# Patient Record
Sex: Male | Born: 1955 | Race: White | Hispanic: No | Marital: Single | Smoking: Current every day smoker
Health system: Southern US, Community
[De-identification: ages and names within clinical notes are randomized; demographics above are authoritative.]

## PROBLEM LIST (undated history)

## (undated) DIAGNOSIS — I739 Peripheral vascular disease, unspecified: Secondary | ICD-10-CM

## (undated) DIAGNOSIS — E119 Type 2 diabetes mellitus without complications: Secondary | ICD-10-CM

## (undated) DIAGNOSIS — G4730 Sleep apnea, unspecified: Secondary | ICD-10-CM

## (undated) DIAGNOSIS — I10 Essential (primary) hypertension: Secondary | ICD-10-CM

## (undated) DIAGNOSIS — N529 Male erectile dysfunction, unspecified: Secondary | ICD-10-CM

## (undated) HISTORY — DX: Essential (primary) hypertension: I10

## (undated) HISTORY — PX: NECK SURGERY: SHX720

## (undated) HISTORY — PX: EYE SURGERY: SHX253

---

## 2014-03-16 ENCOUNTER — Ambulatory Visit: Payer: Self-pay | Admitting: Internal Medicine

## 2014-03-16 ENCOUNTER — Other Ambulatory Visit: Payer: Self-pay | Admitting: Internal Medicine

## 2014-03-16 LAB — CBC WITH DIFFERENTIAL/PLATELET
Basophil #: 0.1 10*3/uL (ref 0.0–0.1)
Basophil %: 1.4 %
Eosinophil #: 0.3 10*3/uL (ref 0.0–0.7)
Eosinophil %: 2.6 %
HCT: 47.3 % (ref 40.0–52.0)
HGB: 16 g/dL (ref 13.0–18.0)
Lymphocyte #: 2.7 10*3/uL (ref 1.0–3.6)
Lymphocyte %: 27.8 %
MCH: 32.8 pg (ref 26.0–34.0)
MCHC: 33.8 g/dL (ref 32.0–36.0)
MCV: 97 fL (ref 80–100)
Monocyte #: 1.3 x10 3/mm — ABNORMAL HIGH (ref 0.2–1.0)
Monocyte %: 12.8 %
Neutrophil #: 5.5 10*3/uL (ref 1.4–6.5)
Neutrophil %: 55.4 %
Platelet: 200 10*3/uL (ref 150–440)
RBC: 4.88 10*6/uL (ref 4.40–5.90)
RDW: 13.6 % (ref 11.5–14.5)
WBC: 9.8 10*3/uL (ref 3.8–10.6)

## 2014-03-16 LAB — URINALYSIS, COMPLETE
Bacteria: NONE SEEN
Bilirubin,UR: NEGATIVE
Blood: NEGATIVE
Glucose,UR: NEGATIVE mg/dL (ref 0–75)
Ketone: NEGATIVE
Leukocyte Esterase: NEGATIVE
Nitrite: NEGATIVE
Ph: 5 (ref 4.5–8.0)
Protein: NEGATIVE
RBC,UR: 1 /HPF (ref 0–5)
Specific Gravity: 1.025 (ref 1.003–1.030)
Squamous Epithelial: <1
WBC UR: 1 /HPF (ref 0–5)

## 2014-03-16 LAB — TSH: Thyroid Stimulating Horm: 1.04 u[IU]/mL

## 2014-03-16 LAB — HEMOGLOBIN A1C: Hemoglobin A1C: 6.3 % (ref 4.2–6.3)

## 2014-03-16 LAB — BASIC METABOLIC PANEL
Anion Gap: 6 — ABNORMAL LOW (ref 7–16)
BUN: 16 mg/dL (ref 7–18)
Calcium, Total: 8.9 mg/dL (ref 8.5–10.1)
Chloride: 106 mmol/L (ref 98–107)
Co2: 29 mmol/L (ref 21–32)
Creatinine: 0.97 mg/dL (ref 0.60–1.30)
EGFR (African American): >60
EGFR (Non-African Amer.): >60
Glucose: 79 mg/dL (ref 65–99)
Osmolality: 281 (ref 275–301)
Potassium: 4 mmol/L (ref 3.5–5.1)
Sodium: 141 mmol/L (ref 136–145)

## 2014-03-16 LAB — HEPATIC FUNCTION PANEL A (ARMC)
Albumin: 3.7 g/dL (ref 3.4–5.0)
Alkaline Phosphatase: 68 U/L
Bilirubin, Direct: <0.1 mg/dL (ref 0.00–0.20)
Bilirubin,Total: 0.3 mg/dL (ref 0.2–1.0)
SGOT(AST): 25 U/L (ref 15–37)
SGPT (ALT): 43 U/L (ref 12–78)
Total Protein: 7.6 g/dL (ref 6.4–8.2)

## 2015-06-04 ENCOUNTER — Ambulatory Visit
Admission: RE | Admit: 2015-06-04 | Payer: BLUE CROSS/BLUE SHIELD | Source: Ambulatory Visit | Admitting: Vascular Surgery

## 2015-06-04 SURGERY — LOWER EXTREMITY ANGIOGRAPHY
Anesthesia: Moderate Sedation | Laterality: Right

## 2015-06-25 MED ORDER — HYDROCODONE-ACETAMINOPHEN 5-325 MG PO TABS
ORAL_TABLET | ORAL | Status: AC
  Filled 2015-06-25: qty 1

## 2015-06-25 MED ORDER — HEPARIN SOD (PORK) LOCK FLUSH 100 UNIT/ML IV SOLN
INTRAVENOUS | Status: AC
  Filled 2015-06-25: qty 5

## 2015-06-25 MED ORDER — BUPIVACAINE HCL (PF) 0.25 % IJ SOLN
INTRAMUSCULAR | Status: AC
  Filled 2015-06-25: qty 30

## 2015-06-25 MED ORDER — MIDAZOLAM HCL 5 MG/5ML IJ SOLN
INTRAMUSCULAR | Status: AC
  Filled 2015-06-25: qty 5

## 2015-06-25 MED ORDER — FENTANYL CITRATE (PF) 100 MCG/2ML IJ SOLN
INTRAMUSCULAR | Status: DC
Start: 2015-06-25 — End: 2015-06-25
  Filled 2015-06-25: qty 4

## 2015-10-21 DIAGNOSIS — I10 Essential (primary) hypertension: Secondary | ICD-10-CM | POA: Insufficient documentation

## 2016-09-24 DIAGNOSIS — R739 Hyperglycemia, unspecified: Secondary | ICD-10-CM | POA: Insufficient documentation

## 2016-09-24 DIAGNOSIS — Z720 Tobacco use: Secondary | ICD-10-CM | POA: Insufficient documentation

## 2017-01-15 DIAGNOSIS — I10 Essential (primary) hypertension: Secondary | ICD-10-CM | POA: Diagnosis not present

## 2017-01-15 DIAGNOSIS — Z720 Tobacco use: Secondary | ICD-10-CM | POA: Diagnosis not present

## 2017-01-15 DIAGNOSIS — R972 Elevated prostate specific antigen [PSA]: Secondary | ICD-10-CM | POA: Diagnosis not present

## 2017-01-15 DIAGNOSIS — J208 Acute bronchitis due to other specified organisms: Secondary | ICD-10-CM | POA: Diagnosis not present

## 2017-01-22 DIAGNOSIS — J301 Allergic rhinitis due to pollen: Secondary | ICD-10-CM | POA: Diagnosis not present

## 2017-01-22 DIAGNOSIS — M5412 Radiculopathy, cervical region: Secondary | ICD-10-CM | POA: Diagnosis not present

## 2017-01-22 DIAGNOSIS — I10 Essential (primary) hypertension: Secondary | ICD-10-CM | POA: Diagnosis not present

## 2017-03-03 DIAGNOSIS — M5412 Radiculopathy, cervical region: Secondary | ICD-10-CM | POA: Diagnosis not present

## 2017-03-03 DIAGNOSIS — R112 Nausea with vomiting, unspecified: Secondary | ICD-10-CM | POA: Diagnosis not present

## 2017-03-03 DIAGNOSIS — I10 Essential (primary) hypertension: Secondary | ICD-10-CM | POA: Diagnosis not present

## 2017-03-08 DIAGNOSIS — I10 Essential (primary) hypertension: Secondary | ICD-10-CM | POA: Diagnosis not present

## 2017-03-09 DIAGNOSIS — R739 Hyperglycemia, unspecified: Secondary | ICD-10-CM | POA: Diagnosis not present

## 2017-03-09 DIAGNOSIS — I10 Essential (primary) hypertension: Secondary | ICD-10-CM | POA: Diagnosis not present

## 2017-05-18 DIAGNOSIS — R8290 Unspecified abnormal findings in urine: Secondary | ICD-10-CM | POA: Diagnosis not present

## 2017-05-18 DIAGNOSIS — M545 Low back pain: Secondary | ICD-10-CM | POA: Diagnosis not present

## 2018-01-26 DIAGNOSIS — R972 Elevated prostate specific antigen [PSA]: Secondary | ICD-10-CM | POA: Diagnosis not present

## 2018-01-26 DIAGNOSIS — R739 Hyperglycemia, unspecified: Secondary | ICD-10-CM | POA: Diagnosis not present

## 2018-01-26 DIAGNOSIS — I10 Essential (primary) hypertension: Secondary | ICD-10-CM | POA: Diagnosis not present

## 2018-01-28 DIAGNOSIS — I10 Essential (primary) hypertension: Secondary | ICD-10-CM | POA: Diagnosis not present

## 2018-01-28 DIAGNOSIS — Z0000 Encounter for general adult medical examination without abnormal findings: Secondary | ICD-10-CM | POA: Diagnosis not present

## 2018-01-28 DIAGNOSIS — R739 Hyperglycemia, unspecified: Secondary | ICD-10-CM | POA: Diagnosis not present

## 2018-02-16 DIAGNOSIS — G43109 Migraine with aura, not intractable, without status migrainosus: Secondary | ICD-10-CM | POA: Diagnosis not present

## 2020-05-30 ENCOUNTER — Ambulatory Visit
Admission: RE | Admit: 2020-05-30 | Discharge: 2020-05-30 | Disposition: A | Discharge status: Home / Self Care | Payer: Self-pay | Source: Ambulatory Visit | Attending: Internal Medicine | Admitting: Internal Medicine

## 2020-05-30 ENCOUNTER — Other Ambulatory Visit: Payer: Self-pay | Admitting: Internal Medicine

## 2020-05-30 ENCOUNTER — Other Ambulatory Visit: Payer: Self-pay

## 2020-05-30 DIAGNOSIS — M79604 Pain in right leg: Secondary | ICD-10-CM | POA: Insufficient documentation

## 2020-05-30 DIAGNOSIS — E1165 Type 2 diabetes mellitus with hyperglycemia: Secondary | ICD-10-CM | POA: Insufficient documentation

## 2020-05-30 DIAGNOSIS — M79605 Pain in left leg: Secondary | ICD-10-CM

## 2020-07-15 ENCOUNTER — Encounter (INDEPENDENT_AMBULATORY_CARE_PROVIDER_SITE_OTHER): Payer: Self-pay | Admitting: Vascular Surgery

## 2020-07-15 ENCOUNTER — Other Ambulatory Visit: Payer: Self-pay

## 2020-07-15 ENCOUNTER — Ambulatory Visit (INDEPENDENT_AMBULATORY_CARE_PROVIDER_SITE_OTHER): Payer: 59 | Admitting: Vascular Surgery

## 2020-07-15 VITALS — BP 148/87 | HR 64 | Resp 16 | Ht 70.0 in | Wt 215.0 lb

## 2020-07-15 DIAGNOSIS — E1165 Type 2 diabetes mellitus with hyperglycemia: Secondary | ICD-10-CM | POA: Diagnosis not present

## 2020-07-15 DIAGNOSIS — I70213 Atherosclerosis of native arteries of extremities with intermittent claudication, bilateral legs: Secondary | ICD-10-CM | POA: Diagnosis not present

## 2020-07-15 DIAGNOSIS — I10 Essential (primary) hypertension: Secondary | ICD-10-CM

## 2020-07-15 DIAGNOSIS — I70219 Atherosclerosis of native arteries of extremities with intermittent claudication, unspecified extremity: Secondary | ICD-10-CM | POA: Insufficient documentation

## 2020-07-15 MED ORDER — GABAPENTIN 300 MG PO CAPS: 300.0000 mg | ORAL_CAPSULE | Freq: Every day | ORAL | 5 refills | Status: DC

## 2020-07-15 NOTE — Progress Notes (Signed)
MRN : [id number]  Carl Norris is a 64 y.o. (05-27-1956) male who presents with chief complaint of  Chief Complaint  Patient presents with  . New Patient (Initial Visit)    ref [facility] abnormal abi  .  History of Present Illness:    The patient is seen for evaluation of painful lower extremities and diminished pulses. Patient notes the pain is always associated with activity and is very consistent day today. Typically, the pain occurs at less than one block, progress is as activity continues to the point that the patient must stop walking. Resting including standing still for several minutes allowed resumption of the activity and the ability to walk a similar distance before stopping again. Uneven terrain and inclined shorten the distance. The pain has been progressive over the past several years. The patient states the inability to walk is now having a profound negative impact on quality of life and daily activities.  The patient denies rest pain or dangling of an extremity off the side of the bed during the night for relief. No open wounds or sores at this time. No prior interventions or surgeries.  No history of  Lower back problems of the lumbar sacral spine but he has had cervical surgeries.   The patient denies changes in claudication symptoms or new rest pain symptoms.  No new ulcers or wounds of the foot.  The patient's blood pressure has been stable and relatively well controlled. The patient denies amaurosis fugax or recent TIA symptoms. There are no recent neurological changes noted. The patient denies history of DVT, PE or superficial thrombophlebitis. The patient denies recent episodes of angina or shortness of breath.   ABI's from an outside institution Rt=0.64 and Lt=0.45  Current Meds  Medication Sig  . hydrochlorothiazide (HYDRODIURIL) 25 MG tablet Take by mouth.  . metoprolol succinate (TOPROL-XL) 100 MG 24 hr tablet Take by mouth.  . naproxen sodium (ALEVE) 220  MG tablet Take 220 mg by mouth.  . vitamin B-12 (CYANOCOBALAMIN) 1000 MCG tablet Take 1,000 mcg by mouth daily.    Past Medical History:  Diagnosis Date  . Hypertension     Social History Social History   Tobacco Use  . Smoking status: Current Every Day Smoker    Packs/day: 2.00    Types: Cigarettes  . Smokeless tobacco: Never Used  Substance Use Topics  . Alcohol use: Yes    Comment: socially  . Drug use: Never    Family History Family History  Problem Relation Age of Onset  . Cancer Father   . Vascular Disease Sister   No family history of bleeding/clotting disorders, porphyria or autoimmune disease   Allergies  Allergen Reactions  . Oxycodone-Acetaminophen Itching     REVIEW OF SYSTEMS (Negative unless checked)  Constitutional: [] Weight loss  [] Fever  [] Chills Cardiac: [] Chest pain   [] Chest pressure   [] Palpitations   [] Shortness of breath when laying flat   [] Shortness of breath with exertion. Vascular:  [x] Pain in legs with walking   [] Pain in legs at rest  [] History of DVT   [] Phlebitis   [] Swelling in legs   [] Varicose veins   [] Non-healing ulcers Pulmonary:   [] Uses home oxygen   [] Productive cough   [] Hemoptysis   [] Wheeze  [] COPD   [] Asthma Neurologic:  [] Dizziness   [] Seizures   [] History of stroke   [] History of TIA  [] Aphasia   [] Vissual changes   [] Weakness or numbness in arm   [] Weakness or numbness in  leg Musculoskeletal:   [] Joint swelling   [] Joint pain   [] Low back pain Hematologic:  [] Easy bruising  [] Easy bleeding   [] Hypercoagulable state   [] Anemic Gastrointestinal:  [] Diarrhea   [] Vomiting  [] Gastroesophageal reflux/heartburn   [] Difficulty swallowing. Genitourinary:  [] Chronic kidney disease   [] Difficult urination  [] Frequent urination   [] Blood in urine Skin:  [] Rashes   [] Ulcers  Psychological:  [] History of anxiety   []  History of major depression.  Physical Examination  Vitals:   07/15/20 1435  BP: (!) 148/87  Pulse: 64  Resp: 16   Weight: 215 lb (97.5 kg)  Height: 5\' 10"  (1.778 m)   Body mass index is 30.85 kg/m. Gen: WD/WN, NAD Head: [place]/AT, No temporalis wasting.  Ear/Nose/Throat: Hearing grossly intact, nares w/o erythema or drainage, poor dentition Eyes: PER, EOMI, sclera nonicteric.  Neck: Supple, no masses.  No bruit or JVD.  Pulmonary:  Good air movement, clear to auscultation bilaterally, no use of accessory muscles.  Cardiac: RRR, normal S1, S2, no Murmurs. Vascular:  Vessel Right Left  Radial Palpable Palpable  PT Not Palpable Not Palpable  DP Not Palpable Not Palpable  Gastrointestinal: soft, non-distended. No guarding/no peritoneal signs.  Musculoskeletal: M/S 5/5 throughout.  No deformity or atrophy.  Neurologic: CN 2-12 intact. Pain and light touch intact in extremities.  Symmetrical.  Speech is fluent. Motor exam as listed above. Psychiatric: Judgment intact, Mood & affect appropriate for pt's clinical situation. Dermatologic: No rashes or ulcers noted.  No changes consistent with cellulitis.   CBC Lab Results  Component Value Date   WBC 9.8 03/16/2014   HGB 16.0 03/16/2014   HCT 47.3 03/16/2014   MCV 97 03/16/2014   PLT 200 03/16/2014    BMET    Component Value Date/Time   NA 141 03/16/2014 1656   K 4.0 03/16/2014 1656   CL 106 03/16/2014 1656   CO2 29 03/16/2014 1656   GLUCOSE 79 03/16/2014 1656   BUN 16 03/16/2014 1656   CREATININE 0.97 03/16/2014 1656   CALCIUM 8.9 03/16/2014 1656   GFRNONAA >60 03/16/2014 1656   GFRAA >60 03/16/2014 1656   CrCl cannot be calculated (Patient's most recent lab result is older than the maximum 21 days allowed.).  COAG No results found for: INR, PROTIME  Radiology No results found.    Assessment/Plan 1. Atherosclerosis of native artery of both lower extremities with intermittent claudication (HCC) Recommend:  Patient should undergo arterial duplex of the lower extremity because there has been a significant deterioration in the  patient's lower extremity symptoms.  The patient states they are having increased pain and a marked decrease in the distance that they can walk.  The risks and benefits as well as the alternatives were discussed in detail with the patient.  All questions were answered.  Patient agrees to proceed and understands this could be a prelude to angiography and intervention.  The patient will follow up with me in the office to review the studies.   - VAS AORTA/IVC/ILIACS; Future - VAS LOWER EXTREMITY ARTERIAL DUPLEX; Future  2. Essential (primary) hypertension Continue antihypertensive medications as already ordered, these medications have been reviewed and there are no changes at this time.   3. Type 2 diabetes mellitus with hyperglycemia, without long-term current use of insulin (HCC) Continue hypoglycemic medications as already ordered, these medications have been reviewed and there are no changes at this time.  Hgb A1C to be monitored as already arranged by primary service  Levora Dredge, MD  07/15/2020 3:10 PM

## 2020-07-30 ENCOUNTER — Other Ambulatory Visit (INDEPENDENT_AMBULATORY_CARE_PROVIDER_SITE_OTHER): Payer: Self-pay | Admitting: Vascular Surgery

## 2020-07-30 DIAGNOSIS — I70213 Atherosclerosis of native arteries of extremities with intermittent claudication, bilateral legs: Secondary | ICD-10-CM

## 2020-08-01 ENCOUNTER — Ambulatory Visit (INDEPENDENT_AMBULATORY_CARE_PROVIDER_SITE_OTHER): Payer: 59 | Admitting: Vascular Surgery

## 2020-08-01 ENCOUNTER — Ambulatory Visit (INDEPENDENT_AMBULATORY_CARE_PROVIDER_SITE_OTHER): Payer: 59

## 2020-08-01 ENCOUNTER — Encounter (INDEPENDENT_AMBULATORY_CARE_PROVIDER_SITE_OTHER): Payer: Self-pay | Admitting: Vascular Surgery

## 2020-08-01 ENCOUNTER — Encounter (INDEPENDENT_AMBULATORY_CARE_PROVIDER_SITE_OTHER): Payer: 59

## 2020-08-01 ENCOUNTER — Other Ambulatory Visit: Payer: Self-pay

## 2020-08-01 VITALS — BP 159/80 | HR 67 | Resp 16 | Wt 218.8 lb

## 2020-08-01 DIAGNOSIS — E1165 Type 2 diabetes mellitus with hyperglycemia: Secondary | ICD-10-CM

## 2020-08-01 DIAGNOSIS — I70213 Atherosclerosis of native arteries of extremities with intermittent claudication, bilateral legs: Secondary | ICD-10-CM

## 2020-08-01 DIAGNOSIS — I10 Essential (primary) hypertension: Secondary | ICD-10-CM

## 2020-08-04 ENCOUNTER — Encounter (INDEPENDENT_AMBULATORY_CARE_PROVIDER_SITE_OTHER): Payer: Self-pay | Admitting: Vascular Surgery

## 2020-08-04 NOTE — H&P (View-Only) (Signed)
MRN : [id number]  Carl Norris is a 64 y.o. (July 02, 1956) male who presents with chief complaint of  Chief Complaint  Patient presents with  . Follow-up    ultrasound follow up  .  History of Present Illness:   The patient returns to the office for followup and review of the noninvasive studies. He continues to c/o severe disability because of his walking secondary to severe pain in the lower extremity symptoms.  The patient notes profound shortening of their claudication distance. No new ulcers or wounds have occurred since the last visit.  There have been no significant changes to the patient's overall health care.  The patient denies amaurosis fugax or recent TIA symptoms. There are no recent neurological changes noted. The patient denies history of DVT, PE or superficial thrombophlebitis. The patient denies recent episodes of angina or shortness of breath.    Duplex US of the lower extremity arterial system shows bilateral SFA occlusions with monophasic tibial signals.  Ultrasound of the aorta and iliac arteries does not identify a hemodynamically significant stenosis.  Current Meds  Medication Sig  . gabapentin (NEURONTIN) 300 MG capsule Take 1 capsule (300 mg total) by mouth at bedtime.  . hydrochlorothiazide (HYDRODIURIL) 25 MG tablet Take by mouth.  . metoprolol succinate (TOPROL-XL) 100 MG 24 hr tablet Take by mouth.  . naproxen sodium (ALEVE) 220 MG tablet Take 220 mg by mouth.  . vitamin B-12 (CYANOCOBALAMIN) 1000 MCG tablet Take 1,000 mcg by mouth daily.    Past Medical History:  Diagnosis Date  . Hypertension     Past Surgical History:  Procedure Laterality Date  . NECK SURGERY      Social History Social History   Tobacco Use  . Smoking status: Current Every Day Smoker    Packs/day: 2.00    Types: Cigarettes  . Smokeless tobacco: Never Used  Substance Use Topics  . Alcohol use: Yes    Comment: socially  . Drug use: Never    Family  History Family History  Problem Relation Age of Onset  . Cancer Father   . Vascular Disease Sister     Allergies  Allergen Reactions  . Oxycodone-Acetaminophen Itching     REVIEW OF SYSTEMS (Negative unless checked)  Constitutional: [] Weight loss  [] Fever  [] Chills Cardiac: [] Chest pain   [] Chest pressure   [] Palpitations   [] Shortness of breath when laying flat   [] Shortness of breath with exertion. Vascular:  [x] Pain in legs with walking   [] Pain in legs at rest  [] History of DVT   [] Phlebitis   [] Swelling in legs   [] Varicose veins   [] Non-healing ulcers Pulmonary:   [] Uses home oxygen   [] Productive cough   [] Hemoptysis   [] Wheeze  [] COPD   [] Asthma Neurologic:  [] Dizziness   [] Seizures   [] History of stroke   [] History of TIA  [] Aphasia   [] Vissual changes   [] Weakness or numbness in arm   [] Weakness or numbness in leg Musculoskeletal:   [] Joint swelling   [] Joint pain   [] Low back pain Hematologic:  [] Easy bruising  [] Easy bleeding   [] Hypercoagulable state   [] Anemic Gastrointestinal:  [] Diarrhea   [] Vomiting  [] Gastroesophageal reflux/heartburn   [] Difficulty swallowing. Genitourinary:  [] Chronic kidney disease   [] Difficult urination  [] Frequent urination   [] Blood in urine Skin:  [] Rashes   [] Ulcers  Psychological:  [] History of anxiety   []  History of major depression.  Physical Examination  Vitals:   08/01/20 0843  BP: (!) 159/80  Pulse: 67  Resp: 16  Weight: 218 lb 12.8 oz (99.2 kg)   Body mass index is 31.39 kg/m. Gen: WD/WN, NAD Head: [place]/AT, No temporalis wasting.  Ear/Nose/Throat: Hearing grossly intact, nares w/o erythema or drainage Eyes: PER, EOMI, sclera nonicteric.  Neck: Supple, no large masses.   Pulmonary:  Good air movement, no audible wheezing bilaterally, no use of accessory muscles.  Cardiac: RRR, no JVD Vascular:  Vessel Right Left  Radial Palpable Palpable  PT Not Palpable Not Palpable  DP Not Palpable Not Palpable  Gastrointestinal:  Non-distended. No guarding/no peritoneal signs.  Musculoskeletal: M/S 5/5 throughout.  No deformity or atrophy.  Neurologic: CN 2-12 intact. Symmetrical.  Speech is fluent. Motor exam as listed above. Psychiatric: Judgment intact, Mood & affect appropriate for pt's clinical situation. Dermatologic: No rashes or ulcers noted.  No changes consistent with cellulitis.  CBC Lab Results  Component Value Date   WBC 9.8 03/16/2014   HGB 16.0 03/16/2014   HCT 47.3 03/16/2014   MCV 97 03/16/2014   PLT 200 03/16/2014    BMET    Component Value Date/Time   NA 141 03/16/2014 1656   K 4.0 03/16/2014 1656   CL 106 03/16/2014 1656   CO2 29 03/16/2014 1656   GLUCOSE 79 03/16/2014 1656   BUN 16 03/16/2014 1656   CREATININE 0.97 03/16/2014 1656   CALCIUM 8.9 03/16/2014 1656   GFRNONAA >60 03/16/2014 1656   GFRAA >60 03/16/2014 1656   CrCl cannot be calculated (Patient's most recent lab result is older than the maximum 21 days allowed.).  COAG No results found for: INR, PROTIME  Radiology VAS [place] LOWER EXTREMITY ARTERIAL DUPLEX  Result Date: 08/01/2020 LOWER EXTREMITY ARTERIAL DUPLEX STUDY Indications: Claudication.  Current ABI: not done Performing Technologist: Salvadore Farber RVT  Examination Guidelines: A complete evaluation includes B-mode imaging, spectral Doppler, color Doppler, and power Doppler as needed of all accessible portions of each vessel. Bilateral testing is considered an integral part of a complete examination. Limited examinations for reoccurring indications may be performed as noted.  +-----------+--------+-----+--------+----------+-----------+ RIGHT      PSV cm/sRatioStenosisWaveform  Comments    +-----------+--------+-----+--------+----------+-----------+ CFA Mid    67                   biphasic              +-----------+--------+-----+--------+----------+-----------+ DFA        87                   biphasic               +-----------+--------+-----+--------+----------+-----------+ SFA Prox   50                   monophasic            +-----------+--------+-----+--------+----------+-----------+ SFA Mid    98                   monophasiccollat seen +-----------+--------+-----+--------+----------+-----------+ SFA Distal              occluded                      +-----------+--------+-----+--------+----------+-----------+ POP Distal 21                   monophasic            +-----------+--------+-----+--------+----------+-----------+ ATA Distal 16  monophasic            +-----------+--------+-----+--------+----------+-----------+ PTA Distal 32                   monophasic            +-----------+--------+-----+--------+----------+-----------+ PERO Distal15                   monophasic            +-----------+--------+-----+--------+----------+-----------+  +-----------+--------+-----+--------+----------+-----------+ LEFT       PSV cm/sRatioStenosisWaveform  Comments    +-----------+--------+-----+--------+----------+-----------+ CFA Mid    133                  monophasic            +-----------+--------+-----+--------+----------+-----------+ DFA        30                   monophasic            +-----------+--------+-----+--------+----------+-----------+ SFA Prox                occluded                      +-----------+--------+-----+--------+----------+-----------+ SFA Mid                 occluded                      +-----------+--------+-----+--------+----------+-----------+ SFA Distal 17                   monophasiccollat seen +-----------+--------+-----+--------+----------+-----------+ POP Distal 24                   monophasic            +-----------+--------+-----+--------+----------+-----------+ ATA Distal 14                   monophasic             +-----------+--------+-----+--------+----------+-----------+ PTA Distal 33                   monophasic            +-----------+--------+-----+--------+----------+-----------+ PERO Distal15                   monophasic            +-----------+--------+-----+--------+----------+-----------+  Summary: Right: Short segment occlusion seen in the distal SFA with collat feeding to popliteal artery. Monophasic flow distal to occlusion. Left: Proximal and mid SFA occluded with collat flow seen feeding into distal SFA and monophasic flow distally.  See table(s) above for measurements and observations. Electronically signed by Levora Dredge MD on 08/01/2020 at 12:09:49 PM.    Final    VAS US AORTA/IVC/ILIACS  Result Date: 08/01/2020 ABDOMINAL AORTA STUDY Indications: claudication left>right  Performing Technologist: Salvadore Farber RVT  Examination Guidelines: A complete evaluation includes B-mode imaging, spectral Doppler, color Doppler, and power Doppler as needed of all accessible portions of each vessel. Bilateral testing is considered an integral part of a complete examination. Limited examinations for reoccurring indications may be performed as noted.  Abdominal Aorta Findings: +-------------+-------+----------+----------+----------+--------+--------+ Location     AP (cm)Trans (cm)PSV (cm/s)Waveform  ThrombusComments +-------------+-------+----------+----------+----------+--------+--------+ Proximal     2.04   1.99      74        biphasic                   +-------------+-------+----------+----------+----------+--------+--------+  Mid          1.81   1.68      70        biphasic                   +-------------+-------+----------+----------+----------+--------+--------+ Distal       1.40   1.44      88        biphasic                   +-------------+-------+----------+----------+----------+--------+--------+ RT CIA Prox  1.0    1.1       68        biphasic                    +-------------+-------+----------+----------+----------+--------+--------+ RT CIA Mid                    129       biphasic                   +-------------+-------+----------+----------+----------+--------+--------+ RT CIA Distal                 115       biphasic                   +-------------+-------+----------+----------+----------+--------+--------+ RT EIA Prox                   82        biphasic                   +-------------+-------+----------+----------+----------+--------+--------+ RT EIA Mid                    101       triphasic                  +-------------+-------+----------+----------+----------+--------+--------+ RT EIA Distal                 96        biphasic                   +-------------+-------+----------+----------+----------+--------+--------+ LT CIA Prox  1.0    1.2       97        biphasic                   +-------------+-------+----------+----------+----------+--------+--------+ LT CIA Mid                    86        biphasic                   +-------------+-------+----------+----------+----------+--------+--------+ LT CIA Distal                 104       biphasic                   +-------------+-------+----------+----------+----------+--------+--------+ LT EIA Prox                   75        biphasic                   +-------------+-------+----------+----------+----------+--------+--------+ LT EIA Mid                    63        monophasic                 +-------------+-------+----------+----------+----------+--------+--------+  LT EIA Distal                 68        monophasic                 +-------------+-------+----------+----------+----------+--------+--------+  Summary: Abdominal Aorta: No evidence of an abdominal aortic aneurysm was visualized. The largest aortic measurement is 2.0 cm. Abdominal aorta and bilateral iliac vessels show mild atherosclerosis and no  significant stenosis. The left mid and sdistal EIA shows monophasic flow consistent with a more distal stenosis or occlusion.  *See table(s) above for measurements and observations.  Electronically signed by Levora DredgeGregory Melessia Kaus MD on 08/01/2020 at 12:09:55 PM.    Final      Assessment/Plan 1. Atherosclerosis of native artery of both lower extremities with intermittent claudication (HCC) Recommend:  The patient has experienced increased symptoms and is now describing bilateral lifestyle limiting claudication and mild rest pain.  He notes his left leg is worse than his right.   Given the severity of the patient's lower extremity symptoms the patient should undergo left leg angiography and intervention.  Risk and benefits were reviewed the patient.  Indications for the procedure were reviewed.  All questions were answered, the patient agrees to proceed with left leg angiogram and possible intervention.   The patient should continue walking and begin a more formal exercise program.  The patient should continue antiplatelet therapy and aggressive treatment of the lipid abnormalities  The patient will follow up with me after the left leg angiogram.   2. Essential (primary) hypertension Continue antihypertensive medications as already ordered, these medications have been reviewed and there are no changes at this time.   3. Type 2 diabetes mellitus with hyperglycemia, without long-term current use of insulin (HCC) Continue hypoglycemic medications as already ordered, these medications have been reviewed and there are no changes at this time.  Hgb A1C to be monitored as already arranged by primary service     Levora DredgeGregory Kamrin Spath, MD  08/04/2020 3:45 PM

## 2020-08-04 NOTE — Progress Notes (Signed)
MRN : [id number]  Carl Norris is a 64 y.o. (July 02, 1956) male who presents with chief complaint of  Chief Complaint  Patient presents with  . Follow-up    ultrasound follow up  .  History of Present Illness:   The patient returns to the office for followup and review of the noninvasive studies. He continues to c/o severe disability because of his walking secondary to severe pain in the lower extremity symptoms.  The patient notes profound shortening of their claudication distance. No new ulcers or wounds have occurred since the last visit.  There have been no significant changes to the patient's overall health care.  The patient denies amaurosis fugax or recent TIA symptoms. There are no recent neurological changes noted. The patient denies history of DVT, PE or superficial thrombophlebitis. The patient denies recent episodes of angina or shortness of breath.    Duplex US of the lower extremity arterial system shows bilateral SFA occlusions with monophasic tibial signals.  Ultrasound of the aorta and iliac arteries does not identify a hemodynamically significant stenosis.  Current Meds  Medication Sig  . gabapentin (NEURONTIN) 300 MG capsule Take 1 capsule (300 mg total) by mouth at bedtime.  . hydrochlorothiazide (HYDRODIURIL) 25 MG tablet Take by mouth.  . metoprolol succinate (TOPROL-XL) 100 MG 24 hr tablet Take by mouth.  . naproxen sodium (ALEVE) 220 MG tablet Take 220 mg by mouth.  . vitamin B-12 (CYANOCOBALAMIN) 1000 MCG tablet Take 1,000 mcg by mouth daily.    Past Medical History:  Diagnosis Date  . Hypertension     Past Surgical History:  Procedure Laterality Date  . NECK SURGERY      Social History Social History   Tobacco Use  . Smoking status: Current Every Day Smoker    Packs/day: 2.00    Types: Cigarettes  . Smokeless tobacco: Never Used  Substance Use Topics  . Alcohol use: Yes    Comment: socially  . Drug use: Never    Family  History Family History  Problem Relation Age of Onset  . Cancer Father   . Vascular Disease Sister     Allergies  Allergen Reactions  . Oxycodone-Acetaminophen Itching     REVIEW OF SYSTEMS (Negative unless checked)  Constitutional: [] Weight loss  [] Fever  [] Chills Cardiac: [] Chest pain   [] Chest pressure   [] Palpitations   [] Shortness of breath when laying flat   [] Shortness of breath with exertion. Vascular:  [x] Pain in legs with walking   [] Pain in legs at rest  [] History of DVT   [] Phlebitis   [] Swelling in legs   [] Varicose veins   [] Non-healing ulcers Pulmonary:   [] Uses home oxygen   [] Productive cough   [] Hemoptysis   [] Wheeze  [] COPD   [] Asthma Neurologic:  [] Dizziness   [] Seizures   [] History of stroke   [] History of TIA  [] Aphasia   [] Vissual changes   [] Weakness or numbness in arm   [] Weakness or numbness in leg Musculoskeletal:   [] Joint swelling   [] Joint pain   [] Low back pain Hematologic:  [] Easy bruising  [] Easy bleeding   [] Hypercoagulable state   [] Anemic Gastrointestinal:  [] Diarrhea   [] Vomiting  [] Gastroesophageal reflux/heartburn   [] Difficulty swallowing. Genitourinary:  [] Chronic kidney disease   [] Difficult urination  [] Frequent urination   [] Blood in urine Skin:  [] Rashes   [] Ulcers  Psychological:  [] History of anxiety   []  History of major depression.  Physical Examination  Vitals:   08/01/20 0843  BP: (!) 159/80  Pulse: 67  Resp: 16  Weight: 218 lb 12.8 oz (99.2 kg)   Body mass index is 31.39 kg/m. Gen: WD/WN, NAD Head: [place]/AT, No temporalis wasting.  Ear/Nose/Throat: Hearing grossly intact, nares w/o erythema or drainage Eyes: PER, EOMI, sclera nonicteric.  Neck: Supple, no large masses.   Pulmonary:  Good air movement, no audible wheezing bilaterally, no use of accessory muscles.  Cardiac: RRR, no JVD Vascular:  Vessel Right Left  Radial Palpable Palpable  PT Not Palpable Not Palpable  DP Not Palpable Not Palpable  Gastrointestinal:  Non-distended. No guarding/no peritoneal signs.  Musculoskeletal: M/S 5/5 throughout.  No deformity or atrophy.  Neurologic: CN 2-12 intact. Symmetrical.  Speech is fluent. Motor exam as listed above. Psychiatric: Judgment intact, Mood & affect appropriate for pt's clinical situation. Dermatologic: No rashes or ulcers noted.  No changes consistent with cellulitis.  CBC Lab Results  Component Value Date   WBC 9.8 03/16/2014   HGB 16.0 03/16/2014   HCT 47.3 03/16/2014   MCV 97 03/16/2014   PLT 200 03/16/2014    BMET    Component Value Date/Time   NA 141 03/16/2014 1656   K 4.0 03/16/2014 1656   CL 106 03/16/2014 1656   CO2 29 03/16/2014 1656   GLUCOSE 79 03/16/2014 1656   BUN 16 03/16/2014 1656   CREATININE 0.97 03/16/2014 1656   CALCIUM 8.9 03/16/2014 1656   GFRNONAA >60 03/16/2014 1656   GFRAA >60 03/16/2014 1656   CrCl cannot be calculated (Patient's most recent lab result is older than the maximum 21 days allowed.).  COAG No results found for: INR, PROTIME  Radiology VAS [place] LOWER EXTREMITY ARTERIAL DUPLEX  Result Date: 08/01/2020 LOWER EXTREMITY ARTERIAL DUPLEX STUDY Indications: Claudication.  Current ABI: not done Performing Technologist: Salvadore Farber RVT  Examination Guidelines: A complete evaluation includes B-mode imaging, spectral Doppler, color Doppler, and power Doppler as needed of all accessible portions of each vessel. Bilateral testing is considered an integral part of a complete examination. Limited examinations for reoccurring indications may be performed as noted.  +-----------+--------+-----+--------+----------+-----------+ RIGHT      PSV cm/sRatioStenosisWaveform  Comments    +-----------+--------+-----+--------+----------+-----------+ CFA Mid    67                   biphasic              +-----------+--------+-----+--------+----------+-----------+ DFA        87                   biphasic               +-----------+--------+-----+--------+----------+-----------+ SFA Prox   50                   monophasic            +-----------+--------+-----+--------+----------+-----------+ SFA Mid    98                   monophasiccollat seen +-----------+--------+-----+--------+----------+-----------+ SFA Distal              occluded                      +-----------+--------+-----+--------+----------+-----------+ POP Distal 21                   monophasic            +-----------+--------+-----+--------+----------+-----------+ ATA Distal 16  monophasic            +-----------+--------+-----+--------+----------+-----------+ PTA Distal 32                   monophasic            +-----------+--------+-----+--------+----------+-----------+ PERO Distal15                   monophasic            +-----------+--------+-----+--------+----------+-----------+  +-----------+--------+-----+--------+----------+-----------+ LEFT       PSV cm/sRatioStenosisWaveform  Comments    +-----------+--------+-----+--------+----------+-----------+ CFA Mid    133                  monophasic            +-----------+--------+-----+--------+----------+-----------+ DFA        30                   monophasic            +-----------+--------+-----+--------+----------+-----------+ SFA Prox                occluded                      +-----------+--------+-----+--------+----------+-----------+ SFA Mid                 occluded                      +-----------+--------+-----+--------+----------+-----------+ SFA Distal 17                   monophasiccollat seen +-----------+--------+-----+--------+----------+-----------+ POP Distal 24                   monophasic            +-----------+--------+-----+--------+----------+-----------+ ATA Distal 14                   monophasic             +-----------+--------+-----+--------+----------+-----------+ PTA Distal 33                   monophasic            +-----------+--------+-----+--------+----------+-----------+ PERO Distal15                   monophasic            +-----------+--------+-----+--------+----------+-----------+  Summary: Right: Short segment occlusion seen in the distal SFA with collat feeding to popliteal artery. Monophasic flow distal to occlusion. Left: Proximal and mid SFA occluded with collat flow seen feeding into distal SFA and monophasic flow distally.  See table(s) above for measurements and observations. Electronically signed by Levora Dredge MD on 08/01/2020 at 12:09:49 PM.    Final    VAS US AORTA/IVC/ILIACS  Result Date: 08/01/2020 ABDOMINAL AORTA STUDY Indications: claudication left>right  Performing Technologist: Salvadore Farber RVT  Examination Guidelines: A complete evaluation includes B-mode imaging, spectral Doppler, color Doppler, and power Doppler as needed of all accessible portions of each vessel. Bilateral testing is considered an integral part of a complete examination. Limited examinations for reoccurring indications may be performed as noted.  Abdominal Aorta Findings: +-------------+-------+----------+----------+----------+--------+--------+ Location     AP (cm)Trans (cm)PSV (cm/s)Waveform  ThrombusComments +-------------+-------+----------+----------+----------+--------+--------+ Proximal     2.04   1.99      74        biphasic                   +-------------+-------+----------+----------+----------+--------+--------+  Mid          1.81   1.68      70        biphasic                   +-------------+-------+----------+----------+----------+--------+--------+ Distal       1.40   1.44      88        biphasic                   +-------------+-------+----------+----------+----------+--------+--------+ RT CIA Prox  1.0    1.1       68        biphasic                    +-------------+-------+----------+----------+----------+--------+--------+ RT CIA Mid                    129       biphasic                   +-------------+-------+----------+----------+----------+--------+--------+ RT CIA Distal                 115       biphasic                   +-------------+-------+----------+----------+----------+--------+--------+ RT EIA Prox                   82        biphasic                   +-------------+-------+----------+----------+----------+--------+--------+ RT EIA Mid                    101       triphasic                  +-------------+-------+----------+----------+----------+--------+--------+ RT EIA Distal                 96        biphasic                   +-------------+-------+----------+----------+----------+--------+--------+ LT CIA Prox  1.0    1.2       97        biphasic                   +-------------+-------+----------+----------+----------+--------+--------+ LT CIA Mid                    86        biphasic                   +-------------+-------+----------+----------+----------+--------+--------+ LT CIA Distal                 104       biphasic                   +-------------+-------+----------+----------+----------+--------+--------+ LT EIA Prox                   75        biphasic                   +-------------+-------+----------+----------+----------+--------+--------+ LT EIA Mid                    63        monophasic                 +-------------+-------+----------+----------+----------+--------+--------+   LT EIA Distal                 68        monophasic                 +-------------+-------+----------+----------+----------+--------+--------+  Summary: Abdominal Aorta: No evidence of an abdominal aortic aneurysm was visualized. The largest aortic measurement is 2.0 cm. Abdominal aorta and bilateral iliac vessels show mild atherosclerosis and no  significant stenosis. The left mid and sdistal EIA shows monophasic flow consistent with a more distal stenosis or occlusion.  *See table(s) above for measurements and observations.  Electronically signed by Levora DredgeGregory Paris Hohn MD on 08/01/2020 at 12:09:55 PM.    Final      Assessment/Plan 1. Atherosclerosis of native artery of both lower extremities with intermittent claudication (HCC) Recommend:  The patient has experienced increased symptoms and is now describing bilateral lifestyle limiting claudication and mild rest pain.  He notes his left leg is worse than his right.   Given the severity of the patient's lower extremity symptoms the patient should undergo left leg angiography and intervention.  Risk and benefits were reviewed the patient.  Indications for the procedure were reviewed.  All questions were answered, the patient agrees to proceed with left leg angiogram and possible intervention.   The patient should continue walking and begin a more formal exercise program.  The patient should continue antiplatelet therapy and aggressive treatment of the lipid abnormalities  The patient will follow up with me after the left leg angiogram.   2. Essential (primary) hypertension Continue antihypertensive medications as already ordered, these medications have been reviewed and there are no changes at this time.   3. Type 2 diabetes mellitus with hyperglycemia, without long-term current use of insulin (HCC) Continue hypoglycemic medications as already ordered, these medications have been reviewed and there are no changes at this time.  Hgb A1C to be monitored as already arranged by primary service     Levora DredgeGregory Caila Cirelli, MD  08/04/2020 3:45 PM

## 2020-08-05 ENCOUNTER — Telehealth (INDEPENDENT_AMBULATORY_CARE_PROVIDER_SITE_OTHER): Payer: Self-pay

## 2020-08-05 NOTE — Telephone Encounter (Signed)
Spoke with the patient and he is scheduled with Dr. Gilda Crease for a left leg angio poss intervention on 08/27/20 with a 9:00 am arrival time to the [facility] Covid testing on 08/23/20 between 8-1 pm at the [facility]. Pre-procedure instructions were discussed and will be mailed.

## 2020-08-16 NOTE — Telephone Encounter (Signed)
Spoke with the patient and he was given the new arrival time of 7:30 am  for his left leg angio with Dr. Gilda Crease on 08/27/20. New pre-procedure instructions will be mailed with time change.

## 2020-08-23 ENCOUNTER — Other Ambulatory Visit
Admission: RE | Admit: 2020-08-23 | Discharge: 2020-08-23 | Disposition: A | Discharge status: Home / Self Care | Payer: 59 | Source: Ambulatory Visit | Attending: Vascular Surgery | Admitting: Vascular Surgery

## 2020-08-23 ENCOUNTER — Other Ambulatory Visit: Payer: Self-pay

## 2020-08-23 DIAGNOSIS — Z20822 Contact with and (suspected) exposure to covid-19: Secondary | ICD-10-CM | POA: Insufficient documentation

## 2020-08-23 DIAGNOSIS — Z01818 Encounter for other preprocedural examination: Secondary | ICD-10-CM | POA: Diagnosis present

## 2020-08-24 LAB — SARS CORONAVIRUS 2 (TAT 6-24 HRS): SARS Coronavirus 2: NEGATIVE

## 2020-08-27 ENCOUNTER — Other Ambulatory Visit (INDEPENDENT_AMBULATORY_CARE_PROVIDER_SITE_OTHER): Payer: Self-pay | Admitting: Nurse Practitioner

## 2020-08-27 ENCOUNTER — Encounter: Payer: Self-pay | Admitting: Vascular Surgery

## 2020-08-27 ENCOUNTER — Ambulatory Visit
Admission: RE | Admit: 2020-08-27 | Discharge: 2020-08-27 | Disposition: A | Discharge status: Home / Self Care | Payer: 59 | Attending: Vascular Surgery | Admitting: Vascular Surgery

## 2020-08-27 ENCOUNTER — Other Ambulatory Visit: Payer: Self-pay

## 2020-08-27 ENCOUNTER — Encounter
Admission: RE | Disposition: A | Discharge status: Home / Self Care | Payer: Self-pay | Source: Home / Self Care | Attending: Vascular Surgery

## 2020-08-27 DIAGNOSIS — E1165 Type 2 diabetes mellitus with hyperglycemia: Secondary | ICD-10-CM | POA: Insufficient documentation

## 2020-08-27 DIAGNOSIS — I70219 Atherosclerosis of native arteries of extremities with intermittent claudication, unspecified extremity: Secondary | ICD-10-CM

## 2020-08-27 DIAGNOSIS — Z885 Allergy status to narcotic agent status: Secondary | ICD-10-CM | POA: Insufficient documentation

## 2020-08-27 DIAGNOSIS — E1151 Type 2 diabetes mellitus with diabetic peripheral angiopathy without gangrene: Secondary | ICD-10-CM | POA: Insufficient documentation

## 2020-08-27 DIAGNOSIS — Z79899 Other long term (current) drug therapy: Secondary | ICD-10-CM | POA: Diagnosis not present

## 2020-08-27 DIAGNOSIS — Z8249 Family history of ischemic heart disease and other diseases of the circulatory system: Secondary | ICD-10-CM | POA: Diagnosis not present

## 2020-08-27 DIAGNOSIS — I70223 Atherosclerosis of native arteries of extremities with rest pain, bilateral legs: Secondary | ICD-10-CM | POA: Diagnosis present

## 2020-08-27 DIAGNOSIS — I10 Essential (primary) hypertension: Secondary | ICD-10-CM | POA: Diagnosis not present

## 2020-08-27 DIAGNOSIS — I70222 Atherosclerosis of native arteries of extremities with rest pain, left leg: Secondary | ICD-10-CM

## 2020-08-27 DIAGNOSIS — F17210 Nicotine dependence, cigarettes, uncomplicated: Secondary | ICD-10-CM | POA: Diagnosis not present

## 2020-08-27 HISTORY — PX: LOWER EXTREMITY ANGIOGRAPHY: CATH118251

## 2020-08-27 HISTORY — DX: Peripheral vascular disease, unspecified: I73.9

## 2020-08-27 LAB — BUN: BUN: 16 mg/dL (ref 8–23)

## 2020-08-27 LAB — CREATININE, SERUM
Creatinine, Ser: 1.18 mg/dL (ref 0.61–1.24)
GFR, Estimated: >60 mL/min (ref >60)

## 2020-08-27 SURGERY — LOWER EXTREMITY ANGIOGRAPHY
Anesthesia: Moderate Sedation | Site: Leg Lower | Laterality: Left

## 2020-08-27 MED ORDER — MIDAZOLAM HCL 5 MG/5ML IJ SOLN
INTRAMUSCULAR | Status: AC
  Filled 2020-08-27: qty 5

## 2020-08-27 MED ORDER — FAMOTIDINE 20 MG PO TABS: 40.0000 mg | ORAL_TABLET | Freq: Once | ORAL | Status: DC | PRN

## 2020-08-27 MED ORDER — MIDAZOLAM HCL 2 MG/ML PO SYRP: 8.0000 mg | ORAL_SOLUTION | Freq: Once | ORAL | Status: DC | PRN

## 2020-08-27 MED ORDER — DIPHENHYDRAMINE HCL 50 MG/ML IJ SOLN
INTRAMUSCULAR | Status: DC | PRN
  Administered 2020-08-27: 25 mg via INTRAVENOUS

## 2020-08-27 MED ORDER — METHYLPREDNISOLONE SODIUM SUCC 125 MG IJ SOLR: 125.0000 mg | Freq: Once | INTRAMUSCULAR | Status: DC | PRN

## 2020-08-27 MED ORDER — ATORVASTATIN CALCIUM 10 MG PO TABS: 10.0000 mg | ORAL_TABLET | Freq: Every day | ORAL | 11 refills | Status: DC

## 2020-08-27 MED ORDER — FENTANYL CITRATE (PF) 100 MCG/2ML IJ SOLN: 12.5000 ug | Freq: Once | INTRAMUSCULAR | Status: DC | PRN

## 2020-08-27 MED ORDER — CEFAZOLIN SODIUM-DEXTROSE 2-4 GM/100ML-% IV SOLN: 2.0000 g | Freq: Once | INTRAVENOUS | Status: AC

## 2020-08-27 MED ORDER — CLOPIDOGREL BISULFATE 75 MG PO TABS: 75.0000 mg | ORAL_TABLET | Freq: Every day | ORAL | 4 refills | Status: DC

## 2020-08-27 MED ORDER — ASPIRIN EC 325 MG PO TBEC: 325.0000 mg | DELAYED_RELEASE_TABLET | ORAL | Status: DC

## 2020-08-27 MED ORDER — HEPARIN SODIUM (PORCINE) 1000 UNIT/ML IJ SOLN
INTRAMUSCULAR | Status: DC | PRN
  Administered 2020-08-27: 3000 [IU] via INTRAVENOUS
  Administered 2020-08-27: 5000 [IU] via INTRAVENOUS

## 2020-08-27 MED ORDER — IODIXANOL 320 MG/ML IV SOLN
INTRAVENOUS | Status: DC | PRN
  Administered 2020-08-27: 110 mL via INTRA_ARTERIAL

## 2020-08-27 MED ORDER — ASPIRIN EC 81 MG PO TBEC: 81.0000 mg | DELAYED_RELEASE_TABLET | Freq: Every day | ORAL | 2 refills | Status: AC

## 2020-08-27 MED ORDER — MIDAZOLAM HCL 2 MG/2ML IJ SOLN
INTRAMUSCULAR | Status: DC | PRN
  Administered 2020-08-27: 2 mg via INTRAVENOUS
  Administered 2020-08-27: 1 mg via INTRAVENOUS
  Administered 2020-08-27: 2 mg via INTRAVENOUS

## 2020-08-27 MED ORDER — FENTANYL CITRATE (PF) 100 MCG/2ML IJ SOLN
INTRAMUSCULAR | Status: AC
  Filled 2020-08-27: qty 2

## 2020-08-27 MED ORDER — SODIUM CHLORIDE 0.9 % IV SOLN
INTRAVENOUS | Status: DC
  Administered 2020-08-27: 300 mL via INTRAVENOUS

## 2020-08-27 MED ORDER — HEPARIN SODIUM (PORCINE) 1000 UNIT/ML IJ SOLN
INTRAMUSCULAR | Status: AC
  Filled 2020-08-27: qty 1

## 2020-08-27 MED ORDER — FENTANYL CITRATE (PF) 100 MCG/2ML IJ SOLN
INTRAMUSCULAR | Status: DC | PRN
Start: 2020-08-27 — End: 2020-08-27
  Administered 2020-08-27 (×3): 50 ug via INTRAVENOUS
  Administered 2020-08-27 (×2): 25 ug via INTRAVENOUS

## 2020-08-27 MED ORDER — ONDANSETRON HCL 4 MG/2ML IJ SOLN: 4.0000 mg | Freq: Four times a day (QID) | INTRAMUSCULAR | Status: DC | PRN

## 2020-08-27 MED ORDER — CLOPIDOGREL BISULFATE 300 MG PO TABS: 300.0000 mg | ORAL_TABLET | ORAL | Status: DC

## 2020-08-27 MED ORDER — MORPHINE SULFATE (PF) 4 MG/ML IV SOLN: 2.0000 mg | INTRAVENOUS | Status: DC | PRN

## 2020-08-27 MED ORDER — DIPHENHYDRAMINE HCL 50 MG/ML IJ SOLN: 50.0000 mg | Freq: Once | INTRAMUSCULAR | Status: DC | PRN

## 2020-08-27 MED ORDER — CEFAZOLIN SODIUM-DEXTROSE 2-4 GM/100ML-% IV SOLN
INTRAVENOUS | Status: AC
  Administered 2020-08-27: 2 g via INTRAVENOUS
  Filled 2020-08-27: qty 100

## 2020-08-27 MED ORDER — DIPHENHYDRAMINE HCL 50 MG/ML IJ SOLN
INTRAMUSCULAR | Status: AC
  Filled 2020-08-27: qty 1

## 2020-08-27 SURGICAL SUPPLY — 40 items
BALLN DORADO 6X100X135 (BALLOONS) ×2
BALLN DORADO 7X40X80 (BALLOONS) ×2
BALLN LUTONIX 5X220X130 (BALLOONS) ×4
BALLN LUTONIX 6X220X130 (BALLOONS) ×4
BALLN LUTONIX DCB 5X40X130 (BALLOONS) ×2
BALLN LUTONIX DCB 5X80X130 (BALLOONS) ×2
BALLN ULTRVRSE 3X200X130 (BALLOONS) ×2
BALLOON DORADO 6X100X135 (BALLOONS) ×1 IMPLANT
BALLOON DORADO 7X40X80 (BALLOONS) ×1 IMPLANT
BALLOON LUTONIX 5X220X130 (BALLOONS) ×2 IMPLANT
BALLOON LUTONIX 6X220X130 (BALLOONS) ×2 IMPLANT
BALLOON LUTONIX DCB 5X40X130 (BALLOONS) ×1 IMPLANT
BALLOON LUTONIX DCB 5X80X130 (BALLOONS) ×1 IMPLANT
BALLOON ULTRVRSE 3X200X130 (BALLOONS) ×1 IMPLANT
CATH ANGIO 5F PIGTAIL 65CM (CATHETERS) ×2 IMPLANT
CATH BEACON 5 .035 65 KMP TIP (CATHETERS) ×2 IMPLANT
CATH BEACON 5 .038 100 VERT TP (CATHETERS) ×2 IMPLANT
CATH CROSSER 14S OTW 146CM (CATHETERS) ×2 IMPLANT
CATH SEEKER .035X135CM (CATHETERS) ×2 IMPLANT
CATH SKICK ANG 70CM (CATHETERS) ×2 IMPLANT
CATH SKICK STR 70CM (CATHETERS) ×2 IMPLANT
COVER PROBE U/S 5X48 (MISCELLANEOUS) ×2 IMPLANT
DEVICE STARCLOSE SE CLOSURE (Vascular Products) ×2 IMPLANT
DEVICE TORQUE .025-.038 (MISCELLANEOUS) ×2 IMPLANT
GLIDEWIRE ADV .035X260CM (WIRE) ×2 IMPLANT
GLIDEWIRE ANGLED SS 035X260CM (WIRE) ×2 IMPLANT
KIT ENCORE 26 ADVANTAGE (KITS) ×2 IMPLANT
KIT FLOWMATE PROCEDURAL (KITS) ×2 IMPLANT
LIFESTENT SOLO 7X200X135 (Permanent Stent) ×2 IMPLANT
NEEDLE ENTRY 21GA 7CM ECHOTIP (NEEDLE) ×2 IMPLANT
PACK ANGIOGRAPHY (CUSTOM PROCEDURE TRAY) ×2 IMPLANT
SET INTRO CAPELLA COAXIAL (SET/KITS/TRAYS/PACK) ×2 IMPLANT
SHEATH BRITE TIP 5FRX11 (SHEATH) ×2 IMPLANT
SHEATH FLEXOR ANSEL2 7FRX45 (SHEATH) ×2 IMPLANT
STENT LIFESTENT 5F 6X100X135 (Permanent Stent) ×2 IMPLANT
STENT LIFESTENT 5F 7X40X135 (Permanent Stent) ×2 IMPLANT
TUBING CONTRAST HIGH PRESS 72 (TUBING) ×4 IMPLANT
VALVE CHECKFLO PERFORMER (SHEATH) ×2 IMPLANT
WIRE J 3MM .035X145CM (WIRE) ×2 IMPLANT
WIRE MAGIC TORQUE 315CM (WIRE) ×2 IMPLANT

## 2020-08-27 NOTE — Discharge Instructions (Signed)
Moderate Conscious Sedation, Adult, Care After These instructions provide you with information about caring for yourself after your procedure. Your health care provider may also give you more specific instructions. Your treatment has been planned according to current medical practices, but problems sometimes occur. Call your health care provider if you have any problems or questions after your procedure. What can I expect after the procedure? After your procedure, it is common:  To feel sleepy for several hours.  To feel clumsy and have poor balance for several hours.  To have poor judgment for several hours.  To vomit if you eat too soon. Follow these instructions at home: For at least 24 hours after the procedure:   Do not: ? Participate in activities where you could fall or become injured. ? Drive. ? Use heavy machinery. ? Drink alcohol. ? Take sleeping pills or medicines that cause drowsiness. ? Make important decisions or sign legal documents. ? Take care of children on your own.  Rest. Eating and drinking  Follow the diet recommended by your health care provider.  If you vomit: ? Drink water, juice, or soup when you can drink without vomiting. ? Make sure you have little or no nausea before eating solid foods. General instructions  Have a responsible adult stay with you until you are awake and alert.  Take over-the-counter and prescription medicines only as told by your health care provider.  If you smoke, do not smoke without supervision.  Keep all follow-up visits as told by your health care provider. This is important. Contact a health care provider if:  You keep feeling nauseous or you keep vomiting.  You feel light-headed.  You develop a rash.  You have a fever. Get help right away if:  You have trouble breathing. This information is not intended to replace advice given to you by your health care provider. Make sure you discuss any questions you have  with your health care provider. Document Revised: 10/15/2017 Document Reviewed: 02/22/2016 Elsevier Patient Education  2020 [employer]   Femoral Site Care This sheet gives you information about how to care for yourself after your procedure. Your health care provider may also give you more specific instructions. If you have problems or questions, contact your health care provider. What can I expect after the procedure? After the procedure, it is common to have:  Bruising that usually fades within 1-2 weeks.  Tenderness at the site. Follow these instructions at home: Wound care  Follow instructions from your health care provider about how to take care of your insertion site. Make sure you: ? Wash your hands with soap and water before you change your bandage (dressing). If soap and water are not available, use hand sanitizer. ? Change your dressing as told by your health care provider. ? Leave stitches (sutures), skin glue, or adhesive strips in place. These skin closures may need to stay in place for 2 weeks or longer. If adhesive strip edges start to loosen and curl up, you may trim the loose edges. Do not remove adhesive strips completely unless your health care provider tells you to do that.  Do not take baths, swim, or use a hot tub until your health care provider approves.  You may shower 24-48 hours after the procedure or as told by your health care provider. ? Gently wash the site with plain soap and water. ? Pat the area dry with a clean towel. ? Do not rub the site. This may cause bleeding.    Do not apply powder or lotion to the site. Keep the site clean and dry.  Check your femoral site every day for signs of infection. Check for: ? Redness, swelling, or pain. ? Fluid or blood. ? Warmth. ? Pus or a bad smell. Activity  For the first 2-3 days after your procedure, or as long as directed: ? Avoid climbing stairs as much as possible. ? Do not squat.  Do not lift  anything that is heavier than 10 lb (4.5 kg), or the limit that you are told, until your health care provider says that it is safe.  Rest as directed. ? Avoid sitting for a long time without moving. Get up to take short walks every 1-2 hours.  Do not drive for 24 hours if you were given a medicine to help you relax (sedative). General instructions  Take over-the-counter and prescription medicines only as told by your health care provider.  Keep all follow-up visits as told by your health care provider. This is important. Contact a health care provider if you have:  A fever or chills.  You have redness, swelling, or pain around your insertion site. Get help right away if:  The catheter insertion area swells very fast.  You pass out.  You suddenly start to sweat or your skin gets clammy.  The catheter insertion area is bleeding, and the bleeding does not stop when you hold steady pressure on the area.  The area near or just beyond the catheter insertion site becomes pale, cool, tingly, or numb. These symptoms may represent a serious problem that is an emergency. Do not wait to see if the symptoms will go away. Get medical help right away. Call your local emergency services (911 in the [place] Do not drive yourself to the hospital. Summary  After the procedure, it is common to have bruising that usually fades within 1-2 weeks.  Check your femoral site every day for signs of infection.  Do not lift anything that is heavier than 10 lb (4.5 kg), or the limit that you are told, until your health care provider says that it is safe. This information is not intended to replace advice given to you by your health care provider. Make sure you discuss any questions you have with your health care provider. Document Revised: 11/15/2017 Document Reviewed: 11/15/2017 Elsevier Patient Education  2020 [employer]   Angiogram, Care After This sheet gives you information about how to care for  yourself after your procedure. Your doctor may also give you more specific instructions. If you have problems or questions, contact your doctor. Follow these instructions at home: Insertion site care  Follow instructions from your doctor about how to take care of your long, thin tube (catheter) insertion area. Make sure you: ? Wash your hands with soap and water before you change your bandage (dressing). If you cannot use soap and water, use hand sanitizer. ? Change your bandage as told by your doctor. ? Leave stitches (sutures), skin glue, or skin tape (adhesive) strips in place. They may need to stay in place for 2 weeks or longer. If tape strips get loose and curl up, you may trim the loose edges. Do not remove tape strips completely unless your doctor says it is okay.  Do not take baths, swim, or use a hot tub until your doctor says it is okay.  You may shower 24-48 hours after the procedure or as told by your doctor. ? Gently wash the area with plain   soap and water. ? Pat the area dry with a clean towel. ? Do not rub the area. This may cause bleeding.  Do not apply powder or lotion to the area. Keep the area clean and dry.  Check your insertion area every day for signs of infection. Check for: ? More redness, swelling, or pain. ? Fluid or blood. ? Warmth. ? Pus or a bad smell. Activity  Rest as told by your doctor, usually for 1-2 days.  Do not lift anything that is heavier than 10 lbs. (4.5 kg) or as told by your doctor.  Do not drive for 24 hours if you were given a medicine to help you relax (sedative).  Do not drive or use heavy machinery while taking prescription pain medicine. General instructions   Go back to your normal activities as told by your doctor, usually in about a week. Ask your doctor what activities are safe for you.  If the insertion area starts to bleed, lie flat and put pressure on the area. If the bleeding does not stop, get help right away. This is an  emergency.  Drink enough fluid to keep your pee (urine) clear or pale yellow.  Take over-the-counter and prescription medicines only as told by your doctor.  Keep all follow-up visits as told by your doctor. This is important. Contact a doctor if:  You have a fever.  You have chills.  You have more redness, swelling, or pain around your insertion area.  You have fluid or blood coming from your insertion area.  The insertion area feels warm to the touch.  You have pus or a bad smell coming from your insertion area.  You have more bruising around the insertion area.  Blood collects in the tissue around the insertion area (hematoma) that may be painful to the touch. Get help right away if:  You have a lot of pain in the insertion area.  The insertion area swells very fast.  The insertion area is bleeding, and the bleeding does not stop after holding steady pressure on the area.  The area near or just beyond the insertion area becomes pale, cool, tingly, or numb. These symptoms may be an emergency. Do not wait to see if the symptoms will go away. Get medical help right away. Call your local emergency services (911 in the [place] Do not drive yourself to the hospital. Summary  After the procedure, it is common to have bruising and tenderness at the long, thin tube insertion area.  After the procedure, it is important to rest and drink plenty of fluids.  Do not take baths, swim, or use a hot tub until your doctor says it is okay to do so. You may shower 24-48 hours after the procedure or as told by your doctor.  If the insertion area starts to bleed, lie flat and put pressure on the area. If the bleeding does not stop, get help right away. This is an emergency. This information is not intended to replace advice given to you by your health care provider. Make sure you discuss any questions you have with your health care provider. Document Revised: 10/15/2017 Document Reviewed:  10/27/2016 Elsevier Patient Education  2020 [employer]  

## 2020-08-27 NOTE — Interval H&P Note (Signed)
History and Physical Interval Note:  08/27/2020 8:21 AM  Carl Norris  has presented today for surgery, with the diagnosis of LT lower extremity angio w possibe intervention  BARD   ASO w claudication Covid  Oct 8.  The various methods of treatment have been discussed with the patient and family. After consideration of risks, benefits and other options for treatment, the patient has consented to  Procedure(s): LOWER EXTREMITY ANGIOGRAPHY (Left) as a surgical intervention.  The patient's history has been reviewed, patient examined, no change in status, stable for surgery.  I have reviewed the patient's chart and labs.  Questions were answered to the patient's satisfaction.     Levora Dredge

## 2020-08-27 NOTE — Progress Notes (Signed)
Pt given a sandwich tray and sat up in bed. Family at bedside.

## 2020-08-27 NOTE — Progress Notes (Signed)
This RN into the vascular lab to insert a catheter to drain the bladder while pressure being held post procedure. Pt tolerated well.

## 2020-08-27 NOTE — Op Note (Signed)
[name] Percutaneous Study/Intervention Procedural Note   Date of Surgery: 08/27/2020  Surgeon:  Katha Cabal, MD.  Pre-operative Diagnosis: Atherosclerotic occlusive disease bilateral lower extremities with rest pain symptoms and lifestyle limiting claudication left leg worse than right  Post-operative diagnosis: Same  Procedure(s) Performed: 1. Introduction catheter into left lower extremity 3rd order catheter placement  2. Contrast injection left lower extremity for distal runoff with additional third order catheter placement   3. Crosser atherectomy of the left SFA and popliteal arteries 4.  Percutaneous transluminal angioplasty and stent placement left superficial femoral artery and popliteal             5.    Percutaneous transluminal angioplasty of the left profunda femoris to 5 mm with a Lutonix drug-eluting balloon artery                               6.     Star close closure right common femoral arteriotomy  Anesthesia: Conscious sedation was administered by the radiology RN under my direct supervision. IV Versed plus fentanyl were utilized. Continuous ECG, pulse oximetry and blood pressure was monitored throughout the entire procedure. Conscious sedation was for a total of 150 minutes and 54 seconds.  Sheath: 7 [place] Ansell right common femoral retrograde  Contrast: 110 cc  Fluoroscopy Time: 35.9 minutes  Indications: Carl Norris presents with increasing pain of both lower extremities.  He notes that his left is much worse than his right.  Physical examination as well as noninvasive studies demonstrate severe atherosclerotic occlusive disease.  Angiography has been recommended for alleviation of his pain.  The risks and benefits are reviewed all questions answered patient agrees to proceed.  Procedure: Carl Norris is a 64 y.o. y.o. male who was identified and appropriate  procedural time out was performed. The patient was then placed supine on the table and prepped and draped in the usual sterile fashion.   Ultrasound was placed in the sterile sleeve and the right groin was evaluated the right common femoral artery was echolucent and pulsatile indicating patency.  Image was recorded for the permanent record and under real-time visualization a microneedle was inserted into the common femoral artery microwire followed by a micro-sheath.  A J-wire was then advanced through the micro-sheath and a  5 [place] sheath was then inserted over a J-wire. J-wire was then advanced and a 5 French pigtail catheter was positioned at the level of T12. AP projection of the aorta was then obtained. Pigtail catheter was repositioned to above the bifurcation and a RAO view of the pelvis was obtained.  Subsequently a pigtail catheter with the stiff angle Glidewire was used to cross the aortic bifurcation the catheter wire were advanced down into the left distal external iliac artery. Oblique view of the femoral bifurcation was then obtained and subsequently the wire was reintroduced and the pigtail catheter negotiated into the SFA representing third order catheter placement. Distal runoff was then performed.  Diagnostic interpretation: The abdominal aorta is opacified with a bolus injection of contrast.  The aorta is widely patent no hemodynamically significant stenoses are noted.  Bilateral single renal arteries are noted no evidence of renal artery stenosis.  Aortic bifurcation is widely patent.  Common and external iliac arteries are widely patent bilaterally.  Internal iliac arteries are patent.  The left common femoral artery is diffusely diseased but there are no hemodynamically significant stenosis.  In the  main trunk of the profunda femoris approximately 6 to 8 cm distal to the origin there is a string sign beyond this it is widely patent with exuberant collaterals to the knee.  The SFA is  a flush occlusion at the origin with reconstitution at Hunter's canal.  There is a tandem lesion of greater than 80% noted in the popliteal at the level of the femoral condyles.  Below this the distal popliteal is widely patent and the trifurcation is patent with three-vessel runoff to the foot.  5000 units of heparin was then given and allowed to circulate and a 7 [place] Ansell sheath was advanced up and over the bifurcation and positioned in the common femoral artery.  Kumpe catheter and advantage wire negotiated through the profunda lesion and a 5 mm x 40 mm Lutonix drug-eluting balloon is advanced across the string sign is inflated to 10 atm for 2 minutes.  Follow-up imaging demonstrates less than 10% residual stenosis with rapid filling of the distal collaterals.  The catheter wire then pulled back into the common femoral.  The 14 S Crosser catheter was then prepped on the field and a combination of an angled side kick as well as a straight side kick catheter was advanced into the cul-de-sac of the SFA under magnified imaging in the LAO projection. Using the Crosser catheter the occlusion of the SFA and popliteal was negotiated.  This was quite difficult as there were numerous high-grade lesions.  Ultimately using the Crosser catheter as well as a Glidewire I was able to negotiate the wire catheter combination into the distal popliteal where hand-injection contrast demonstrated intraluminal positioning.   Several 5 mm Lutonix drug-eluting balloons used to angioplasty the superficial femoral and popliteal arteries. Inflations were to 10 atmospheres for 2 minutes.  Follow-up imaging demonstrated multiple areas of greater than 80% residual stenosis and therefore a 6 mm x 100 mm stent was deployed in the distal SFA extending down to the mid popliteal.  It was postdilated with a 6 mm Lutonix drug-eluting balloon.  Attention was turned more proximally where a 7 mm x 200 mm stent was deployed beginning  approximately 15 mm below the origin of the SFA a second stent was then deployed extending from the origin of the SFA overlapping the previously placed stent.  These were then postdilated with a 6 mm Lutonix drug-eluting balloons inflated to 14 atm for 1 minute.  Follow-up demonstrated 3 discrete lesions that were greater than 40% stenosis and I elected to use a 7 mm x 40 mm [place] balloon 3 separate inflations were performed to 14 to 18 atm.  Follow-up imaging now demonstrated less than 20% residual stenosis extending through the origin of the SFA down to the trifurcation with preservation of distal runoff.  After review of these images the sheath is pulled into the right external iliac oblique of the common femoral is obtained and a Star close device deployed. There no immediate Complications.  Findings:  The abdominal aorta is opacified with a bolus injection of contrast.  The aorta is widely patent no hemodynamically significant stenoses are noted.  Bilateral single renal arteries are noted no evidence of renal artery stenosis.  Aortic bifurcation is widely patent.  Common and external iliac arteries are widely patent bilaterally.  Internal iliac arteries are patent.  The left common femoral artery is diffusely diseased but there are no hemodynamically significant stenosis.  In the main trunk of the profunda femoris approximately 6 to 8 cm distal to the  origin there is a string sign beyond this it is widely patent with exuberant collaterals to the knee.  The SFA is a flush occlusion at the origin with reconstitution at Hunter's canal.  There is a tandem lesion of greater than 80% noted in the popliteal at the level of the femoral condyles.  Below this the distal popliteal is widely patent and the trifurcation is patent with three-vessel runoff to the foot.  Following crosser atherectomy there is now recanalization of the SFA and proximal popliteal.  Following angioplasty of the SFA there is greater  than 80% residual stenosis at several locations.  Life stents are deployed as described above and following angioplasty to a maximum of 7 mm there is now less than 20% residual stenosis throughout the entire system.  Three-vessel distal runoff is preserved.  Following angioplasty of the profunda femoris with a 5 mm Lutonix drug-eluting balloon there is now less than 20% residual stenosis and a marked improvement in flow to the distal profunda and collaterals   Disposition: Patient was taken to the recovery room in stable condition having tolerated the procedure well.  Belenda Cruise Mykaila Blunck 08/27/2020,1:38 PM

## 2020-08-30 ENCOUNTER — Telehealth (INDEPENDENT_AMBULATORY_CARE_PROVIDER_SITE_OTHER): Payer: Self-pay

## 2020-08-30 NOTE — Telephone Encounter (Signed)
Pt's sister called and left a message on the nurses line saying pt had a LE Angio on Tuesday and is in a lot of pain and cannot walk. The pt's sister would like to know could be prescribe the pt something for his pain as well as does the pt need to go back to work Monday. Please advise.

## 2020-08-30 NOTE — Telephone Encounter (Signed)
I called the pt  And he made me aware that he is feeling better an does not want medication  and he wants to go to work on Monday as well but will need a note . I spoke with the NP and left a VM for the Pt making him aware that the letter will be available in his my chart  Otherwise he can pick it up at our office.

## 2020-09-02 ENCOUNTER — Telehealth (INDEPENDENT_AMBULATORY_CARE_PROVIDER_SITE_OTHER): Payer: Self-pay

## 2020-09-02 ENCOUNTER — Other Ambulatory Visit: Payer: Self-pay

## 2020-09-02 ENCOUNTER — Other Ambulatory Visit (INDEPENDENT_AMBULATORY_CARE_PROVIDER_SITE_OTHER): Payer: Self-pay | Admitting: Nurse Practitioner

## 2020-09-02 ENCOUNTER — Other Ambulatory Visit (INDEPENDENT_AMBULATORY_CARE_PROVIDER_SITE_OTHER): Payer: Self-pay | Admitting: Vascular Surgery

## 2020-09-02 ENCOUNTER — Ambulatory Visit (INDEPENDENT_AMBULATORY_CARE_PROVIDER_SITE_OTHER): Payer: 59

## 2020-09-02 ENCOUNTER — Ambulatory Visit (INDEPENDENT_AMBULATORY_CARE_PROVIDER_SITE_OTHER): Payer: 59 | Admitting: Vascular Surgery

## 2020-09-02 ENCOUNTER — Encounter (INDEPENDENT_AMBULATORY_CARE_PROVIDER_SITE_OTHER): Payer: Self-pay | Admitting: Vascular Surgery

## 2020-09-02 ENCOUNTER — Encounter (INDEPENDENT_AMBULATORY_CARE_PROVIDER_SITE_OTHER): Payer: Self-pay | Admitting: Nurse Practitioner

## 2020-09-02 VITALS — BP 124/73 | HR 73 | Resp 16 | Wt 212.4 lb

## 2020-09-02 DIAGNOSIS — M79605 Pain in left leg: Secondary | ICD-10-CM

## 2020-09-02 DIAGNOSIS — I10 Essential (primary) hypertension: Secondary | ICD-10-CM

## 2020-09-02 DIAGNOSIS — R209 Unspecified disturbances of skin sensation: Secondary | ICD-10-CM | POA: Diagnosis not present

## 2020-09-02 DIAGNOSIS — Z95820 Peripheral vascular angioplasty status with implants and grafts: Secondary | ICD-10-CM | POA: Diagnosis not present

## 2020-09-02 DIAGNOSIS — E1165 Type 2 diabetes mellitus with hyperglycemia: Secondary | ICD-10-CM

## 2020-09-02 DIAGNOSIS — I70222 Atherosclerosis of native arteries of extremities with rest pain, left leg: Secondary | ICD-10-CM

## 2020-09-02 NOTE — Telephone Encounter (Signed)
Patient left a voicemail checking on the status of the letter he requested on Friday and I made him aware that the letter was ready. The patient stated since his procedure on 10/12 his pain has being worst in the left calf area and his left foot is cold as ice. I spoke with Dr Gilda Crease and recommended for the patient to come in with abi and see provider Patient has being schedule to come in today.

## 2020-09-03 ENCOUNTER — Other Ambulatory Visit
Admission: RE | Admit: 2020-09-03 | Discharge: 2020-09-03 | Disposition: A | Discharge status: Home / Self Care | Payer: 59 | Source: Ambulatory Visit | Attending: Vascular Surgery | Admitting: Vascular Surgery

## 2020-09-03 ENCOUNTER — Encounter: Payer: Self-pay | Admitting: Vascular Surgery

## 2020-09-03 ENCOUNTER — Encounter
Admission: RE | Disposition: A | Discharge status: Home / Self Care | Payer: Self-pay | Source: Home / Self Care | Attending: Vascular Surgery

## 2020-09-03 ENCOUNTER — Encounter (INDEPENDENT_AMBULATORY_CARE_PROVIDER_SITE_OTHER): Payer: Self-pay | Admitting: Vascular Surgery

## 2020-09-03 ENCOUNTER — Inpatient Hospital Stay
Admission: RE | Admit: 2020-09-03 | Discharge: 2020-09-10 | DRG: 253 | Disposition: A | Discharge status: Home / Self Care | Payer: 59 | Attending: Vascular Surgery | Admitting: Vascular Surgery

## 2020-09-03 ENCOUNTER — Other Ambulatory Visit (INDEPENDENT_AMBULATORY_CARE_PROVIDER_SITE_OTHER): Payer: Self-pay | Admitting: Vascular Surgery

## 2020-09-03 ENCOUNTER — Other Ambulatory Visit: Payer: Self-pay

## 2020-09-03 DIAGNOSIS — R5082 Postprocedural fever: Secondary | ICD-10-CM | POA: Diagnosis not present

## 2020-09-03 DIAGNOSIS — Z0184 Encounter for antibody response examination: Secondary | ICD-10-CM | POA: Diagnosis not present

## 2020-09-03 DIAGNOSIS — I70229 Atherosclerosis of native arteries of extremities with rest pain, unspecified extremity: Secondary | ICD-10-CM | POA: Diagnosis present

## 2020-09-03 DIAGNOSIS — Z809 Family history of malignant neoplasm, unspecified: Secondary | ICD-10-CM

## 2020-09-03 DIAGNOSIS — Z7902 Long term (current) use of antithrombotics/antiplatelets: Secondary | ICD-10-CM | POA: Diagnosis not present

## 2020-09-03 DIAGNOSIS — I70222 Atherosclerosis of native arteries of extremities with rest pain, left leg: Secondary | ICD-10-CM | POA: Diagnosis present

## 2020-09-03 DIAGNOSIS — I70213 Atherosclerosis of native arteries of extremities with intermittent claudication, bilateral legs: Secondary | ICD-10-CM | POA: Diagnosis present

## 2020-09-03 DIAGNOSIS — E1151 Type 2 diabetes mellitus with diabetic peripheral angiopathy without gangrene: Secondary | ICD-10-CM | POA: Diagnosis present

## 2020-09-03 DIAGNOSIS — Z885 Allergy status to narcotic agent status: Secondary | ICD-10-CM | POA: Diagnosis not present

## 2020-09-03 DIAGNOSIS — I10 Essential (primary) hypertension: Secondary | ICD-10-CM | POA: Diagnosis present

## 2020-09-03 DIAGNOSIS — R509 Fever, unspecified: Secondary | ICD-10-CM | POA: Diagnosis not present

## 2020-09-03 DIAGNOSIS — M79652 Pain in left thigh: Secondary | ICD-10-CM | POA: Diagnosis not present

## 2020-09-03 DIAGNOSIS — I82402 Acute embolism and thrombosis of unspecified deep veins of left lower extremity: Secondary | ICD-10-CM

## 2020-09-03 DIAGNOSIS — Z79899 Other long term (current) drug therapy: Secondary | ICD-10-CM

## 2020-09-03 DIAGNOSIS — R059 Cough, unspecified: Secondary | ICD-10-CM

## 2020-09-03 DIAGNOSIS — Z20822 Contact with and (suspected) exposure to covid-19: Secondary | ICD-10-CM | POA: Diagnosis present

## 2020-09-03 DIAGNOSIS — F17210 Nicotine dependence, cigarettes, uncomplicated: Secondary | ICD-10-CM | POA: Diagnosis present

## 2020-09-03 DIAGNOSIS — Z791 Long term (current) use of non-steroidal anti-inflammatories (NSAID): Secondary | ICD-10-CM | POA: Diagnosis not present

## 2020-09-03 DIAGNOSIS — E1165 Type 2 diabetes mellitus with hyperglycemia: Secondary | ICD-10-CM | POA: Diagnosis present

## 2020-09-03 DIAGNOSIS — Z7982 Long term (current) use of aspirin: Secondary | ICD-10-CM

## 2020-09-03 DIAGNOSIS — D72829 Elevated white blood cell count, unspecified: Secondary | ICD-10-CM | POA: Diagnosis not present

## 2020-09-03 DIAGNOSIS — I743 Embolism and thrombosis of arteries of the lower extremities: Secondary | ICD-10-CM | POA: Diagnosis present

## 2020-09-03 DIAGNOSIS — T82868A Thrombosis of vascular prosthetic devices, implants and grafts, initial encounter: Principal | ICD-10-CM | POA: Diagnosis present

## 2020-09-03 DIAGNOSIS — E119 Type 2 diabetes mellitus without complications: Secondary | ICD-10-CM | POA: Diagnosis not present

## 2020-09-03 DIAGNOSIS — D62 Acute posthemorrhagic anemia: Secondary | ICD-10-CM | POA: Diagnosis present

## 2020-09-03 HISTORY — PX: LOWER EXTREMITY ANGIOGRAPHY: CATH118251

## 2020-09-03 LAB — BUN: BUN: 20 mg/dL (ref 8–23)

## 2020-09-03 LAB — CREATININE, SERUM
Creatinine, Ser: 1.27 mg/dL — ABNORMAL HIGH (ref 0.61–1.24)
GFR, Estimated: 60 mL/min — ABNORMAL LOW (ref >60)

## 2020-09-03 LAB — SARS CORONAVIRUS 2 BY RT PCR (HOSPITAL ORDER, PERFORMED IN ~~LOC~~ HOSPITAL LAB): SARS Coronavirus 2: NEGATIVE

## 2020-09-03 SURGERY — LOWER EXTREMITY ANGIOGRAPHY
Anesthesia: Moderate Sedation | Laterality: Left

## 2020-09-03 MED ORDER — ONDANSETRON HCL 4 MG/2ML IJ SOLN: 4.0000 mg | Freq: Four times a day (QID) | INTRAMUSCULAR | Status: DC | PRN

## 2020-09-03 MED ORDER — FENTANYL CITRATE (PF) 100 MCG/2ML IJ SOLN: 12.5000 ug | Freq: Once | INTRAMUSCULAR | Status: DC | PRN

## 2020-09-03 MED ORDER — HEPARIN SODIUM (PORCINE) 1000 UNIT/ML IJ SOLN
INTRAMUSCULAR | Status: AC
  Filled 2020-09-03: qty 1

## 2020-09-03 MED ORDER — MORPHINE SULFATE (PF) 4 MG/ML IV SOLN: 2.0000 mg | INTRAVENOUS | Status: DC | PRN

## 2020-09-03 MED ORDER — HEPARIN SODIUM (PORCINE) 1000 UNIT/ML IJ SOLN
INTRAMUSCULAR | Status: DC | PRN
  Administered 2020-09-03: 4000 [IU] via INTRAVENOUS

## 2020-09-03 MED ORDER — OXYCODONE HCL 5 MG PO TABS: 5.0000 mg | ORAL_TABLET | ORAL | Status: DC | PRN

## 2020-09-03 MED ORDER — HEPARIN (PORCINE) 25000 UT/250ML-% IV SOLN
1550.0000 [IU]/h | INTRAVENOUS | Status: DC
  Administered 2020-09-04: 1650 [IU]/h via INTRAVENOUS
  Filled 2020-09-03: qty 250

## 2020-09-03 MED ORDER — NICOTINE 14 MG/24HR TD PT24
14.0000 mg | MEDICATED_PATCH | Freq: Every day | TRANSDERMAL | Status: DC
  Administered 2020-09-03 – 2020-09-04 (×2): 14 mg via TRANSDERMAL
  Filled 2020-09-03 (×3): qty 1

## 2020-09-03 MED ORDER — CEFAZOLIN SODIUM-DEXTROSE 2-4 GM/100ML-% IV SOLN
2.0000 g | Freq: Once | INTRAVENOUS | Status: AC
  Administered 2020-09-03: 2 g via INTRAVENOUS

## 2020-09-03 MED ORDER — MIDAZOLAM HCL 2 MG/2ML IJ SOLN
INTRAMUSCULAR | Status: DC | PRN
  Administered 2020-09-03 (×2): 1 mg via INTRAVENOUS
  Administered 2020-09-03: 2 mg via INTRAVENOUS
  Administered 2020-09-03: 1 mg via INTRAVENOUS

## 2020-09-03 MED ORDER — MIDAZOLAM HCL 5 MG/5ML IJ SOLN
INTRAMUSCULAR | Status: AC
  Filled 2020-09-03: qty 5

## 2020-09-03 MED ORDER — ACETAMINOPHEN 325 MG PO TABS: 650.0000 mg | ORAL_TABLET | ORAL | Status: DC | PRN

## 2020-09-03 MED ORDER — GABAPENTIN 300 MG PO CAPS
300.0000 mg | ORAL_CAPSULE | Freq: Every day | ORAL | Status: DC
  Administered 2020-09-03 – 2020-09-09 (×7): 300 mg via ORAL
  Filled 2020-09-03 (×7): qty 1

## 2020-09-03 MED ORDER — FENTANYL CITRATE (PF) 100 MCG/2ML IJ SOLN
INTRAMUSCULAR | Status: AC
  Administered 2020-09-04: 25 ug via INTRAVENOUS
  Filled 2020-09-03: qty 2

## 2020-09-03 MED ORDER — ASPIRIN EC 81 MG PO TBEC
81.0000 mg | DELAYED_RELEASE_TABLET | Freq: Every day | ORAL | Status: DC
  Administered 2020-09-04 – 2020-09-10 (×7): 81 mg via ORAL
  Filled 2020-09-03 (×9): qty 1

## 2020-09-03 MED ORDER — METOPROLOL SUCCINATE ER 100 MG PO TB24
100.0000 mg | ORAL_TABLET | Freq: Every day | ORAL | Status: DC
  Administered 2020-09-04 – 2020-09-09 (×6): 100 mg via ORAL
  Filled 2020-09-03 (×6): qty 1
  Filled 2020-09-03: qty 2

## 2020-09-03 MED ORDER — CLOPIDOGREL BISULFATE 75 MG PO TABS
75.0000 mg | ORAL_TABLET | Freq: Every day | ORAL | Status: DC
  Administered 2020-09-04 – 2020-09-10 (×7): 75 mg via ORAL
  Filled 2020-09-03 (×7): qty 1

## 2020-09-03 MED ORDER — FAMOTIDINE 20 MG PO TABS: 40.0000 mg | ORAL_TABLET | Freq: Once | ORAL | Status: DC | PRN

## 2020-09-03 MED ORDER — FENTANYL CITRATE (PF) 100 MCG/2ML IJ SOLN
INTRAMUSCULAR | Status: DC | PRN
Start: 2020-09-03 — End: 2020-09-03
  Administered 2020-09-03: 100 ug via INTRAVENOUS
  Administered 2020-09-03 (×2): 50 ug via INTRAVENOUS

## 2020-09-03 MED ORDER — SODIUM CHLORIDE 0.9% FLUSH: 3.0000 mL | INTRAVENOUS | Status: DC | PRN

## 2020-09-03 MED ORDER — IODIXANOL 320 MG/ML IV SOLN
INTRAVENOUS | Status: DC | PRN
  Administered 2020-09-03: 75 mL

## 2020-09-03 MED ORDER — DIPHENHYDRAMINE HCL 50 MG/ML IJ SOLN: 50.0000 mg | Freq: Once | INTRAMUSCULAR | Status: DC | PRN

## 2020-09-03 MED ORDER — MIDAZOLAM HCL 2 MG/ML PO SYRP: 8.0000 mg | ORAL_SOLUTION | Freq: Once | ORAL | Status: DC | PRN

## 2020-09-03 MED ORDER — ATORVASTATIN CALCIUM 10 MG PO TABS
10.0000 mg | ORAL_TABLET | Freq: Every day | ORAL | Status: DC
  Administered 2020-09-04 – 2020-09-10 (×7): 10 mg via ORAL
  Filled 2020-09-03 (×7): qty 1

## 2020-09-03 MED ORDER — HEPARIN (PORCINE) 25000 UT/250ML-% IV SOLN
INTRAVENOUS | Status: AC
  Administered 2020-09-03: 1650 [IU]/h via INTRAVENOUS
  Filled 2020-09-03: qty 250

## 2020-09-03 MED ORDER — CEFAZOLIN SODIUM-DEXTROSE 2-4 GM/100ML-% IV SOLN
INTRAVENOUS | Status: AC
  Filled 2020-09-03: qty 100

## 2020-09-03 MED ORDER — METHYLPREDNISOLONE SODIUM SUCC 125 MG IJ SOLR: 125.0000 mg | Freq: Once | INTRAMUSCULAR | Status: DC | PRN

## 2020-09-03 MED ORDER — SODIUM CHLORIDE 0.9 % IV SOLN: 250.0000 mL | INTRAVENOUS | Status: DC | PRN

## 2020-09-03 MED ORDER — SODIUM CHLORIDE 0.9% FLUSH
3.0000 mL | Freq: Two times a day (BID) | INTRAVENOUS | Status: DC
  Administered 2020-09-03 – 2020-09-04 (×3): 3 mL via INTRAVENOUS

## 2020-09-03 MED ORDER — SODIUM CHLORIDE 0.9 % IV SOLN: INTRAVENOUS | Status: AC

## 2020-09-03 MED ORDER — SODIUM CHLORIDE 0.9 % IV SOLN: INTRAVENOUS | Status: DC

## 2020-09-03 SURGICAL SUPPLY — 19 items
CANISTER PENUMBRA ENGINE (MISCELLANEOUS) ×2 IMPLANT
CATH ANGIO 5F PIGTAIL 65CM (CATHETERS) ×2 IMPLANT
CATH BEACON 5 .035 65 C2 TIP (CATHETERS) ×2 IMPLANT
CATH BEACON 5 .035 65 KMP TIP (CATHETERS) ×2 IMPLANT
COVER PROBE U/S 5X48 (MISCELLANEOUS) ×2 IMPLANT
DEVICE STARCLOSE SE CLOSURE (Vascular Products) ×2 IMPLANT
DEVICE TORQUE .025-.038 (MISCELLANEOUS) ×2 IMPLANT
GLIDEWIRE ADV .035X260CM (WIRE) ×2 IMPLANT
GLIDEWIRE ANGLED SS 035X260CM (WIRE) ×2 IMPLANT
NEEDLE ENTRY 21GA 7CM ECHOTIP (NEEDLE) ×2 IMPLANT
PACK ANGIOGRAPHY (CUSTOM PROCEDURE TRAY) ×2 IMPLANT
SET INTRO CAPELLA COAXIAL (SET/KITS/TRAYS/PACK) ×2 IMPLANT
SHEATH BRITE TIP 5FRX11 (SHEATH) ×2 IMPLANT
SHEATH FLEXOR ANSEL2 7FRX45 (SHEATH) ×2 IMPLANT
SHEATH PINNACLE MP 7F 45CM (SHEATH) ×4 IMPLANT
SYR MEDRAD MARK 7 150ML (SYRINGE) ×2 IMPLANT
TUBING CONTRAST HIGH PRESS 72 (TUBING) ×4 IMPLANT
WIRE J 3MM .035X145CM (WIRE) ×2 IMPLANT
WIRE ROSEN-J .035X260CM (WIRE) ×2 IMPLANT

## 2020-09-03 NOTE — Progress Notes (Signed)
Patient oriented to room and call bell.  Heparin and IVF infusing on arrival to room.  Bed locked in low position.

## 2020-09-03 NOTE — Progress Notes (Signed)
Patient called to confirm arrival time for Covid test and Vascular procedure. Patient was told to arrive at 1400. I told him to arrive as soon as possible for his Covid test.

## 2020-09-03 NOTE — H&P (View-Only) (Signed)
MRN : [id number]  Carl Norris is a 64 y.o. ([birth year]) male who presents with chief complaint of  Chief Complaint  Patient presents with  . Follow-up    left calf pain and cold foot  .  History of Present Illness: The patient returns to the office sooner than expected with complaint of increasing pain of the left lower extremity.  He is status post revascularization of his SFA with angioplasty and stent placement.  He notes initially his leg felt much better but then approximately 24 to 48 hours after the procedure it became cold and started hurting and his short distance claudication rest pain symptoms returned.  Duplex ultrasound obtained in the office today demonstrates reocclusion of the SFA stents.  Current Meds  Medication Sig  . aspirin EC 81 MG tablet Take 1 tablet (81 mg total) by mouth daily. Swallow whole.  Marland Kitchen atorvastatin (LIPITOR) 10 MG tablet Take 1 tablet (10 mg total) by mouth daily.  . clopidogrel (PLAVIX) 75 MG tablet Take 1 tablet (75 mg total) by mouth daily.  . hydrochlorothiazide (HYDRODIURIL) 25 MG tablet Take 25 mg by mouth daily.   . metoprolol succinate (TOPROL-XL) 100 MG 24 hr tablet Take 100 mg by mouth daily.   . naproxen sodium (ALEVE) 220 MG tablet Take 440 mg by mouth daily.   . vitamin B-12 (CYANOCOBALAMIN) 1000 MCG tablet Take 1,000 mcg by mouth daily.    Past Medical History:  Diagnosis Date  . Hypertension   . Peripheral vascular disease [facility])     Past Surgical History:  Procedure Laterality Date  . LOWER EXTREMITY ANGIOGRAPHY Left 08/27/2020   Procedure: LOWER EXTREMITY ANGIOGRAPHY;  Surgeon: Renford Dills, MD;  Location: [facility] INVASIVE CV LAB;  Service: Cardiovascular;  Laterality: Left;  . NECK SURGERY      Social History Social History   Tobacco Use  . Smoking status: Current Every Day Smoker    Packs/day: 2.00    Years: 47.00    Pack years: 94.00    Types: Cigarettes  . Smokeless tobacco: Never Used  . Tobacco  comment: SMOKED TODAY   Substance Use Topics  . Alcohol use: Yes    Comment: socially  . Drug use: Never    Family History Family History  Problem Relation Age of Onset  . Cancer Father   . Vascular Disease Sister     Allergies  Allergen Reactions  . Oxycodone-Acetaminophen Itching     REVIEW OF SYSTEMS (Negative unless checked)  Constitutional: [] Weight loss  [] Fever  [] Chills Cardiac: [] Chest pain   [] Chest pressure   [] Palpitations   [] Shortness of breath when laying flat   [] Shortness of breath with exertion. Vascular:  [x] Pain in legs with walking   [x] Pain in legs at rest  [] History of DVT   [] Phlebitis   [] Swelling in legs   [] Varicose veins   [] Non-healing ulcers Pulmonary:   [] Uses home oxygen   [] Productive cough   [] Hemoptysis   [] Wheeze  [] COPD   [] Asthma Neurologic:  [] Dizziness   [] Seizures   [] History of stroke   [] History of TIA  [] Aphasia   [] Vissual changes   [] Weakness or numbness in arm   [] Weakness or numbness in leg Musculoskeletal:   [] Joint swelling   [] Joint pain   [] Low back pain Hematologic:  [] Easy bruising  [] Easy bleeding   [] Hypercoagulable state   [] Anemic Gastrointestinal:  [] Diarrhea   [] Vomiting  [] Gastroesophageal reflux/heartburn   [] Difficulty swallowing. Genitourinary:  [] Chronic kidney disease   []   Difficult urination  [] Frequent urination   [] Blood in urine Skin:  [] Rashes   [] Ulcers  Psychological:  [] History of anxiety   []  History of major depression.  Physical Examination  Vitals:   09/02/20 1500  BP: 124/73  Pulse: 73  Resp: 16  Weight: 212 lb 6.4 oz (96.3 kg)   Body mass index is 30.48 kg/m. Gen: WD/WN, NAD Head: [place]/AT, No temporalis wasting.  Ear/Nose/Throat: Hearing grossly intact, nares w/o erythema or drainage Eyes: PER, EOMI, sclera nonicteric.  Neck: Supple, no large masses.   Pulmonary:  Good air movement, no audible wheezing bilaterally, no use of accessory muscles.  Cardiac: RRR, no JVD Vascular:  Vessel Right  Left  Radial Palpable Palpable  PT Not Palpable Not Palpable  DP Not Palpable Not Palpable  Gastrointestinal: Non-distended. No guarding/no peritoneal signs.  Musculoskeletal: M/S 5/5 throughout.  No deformity or atrophy.  Neurologic: CN 2-12 intact. Symmetrical.  Speech is fluent. Motor exam as listed above. Psychiatric: Judgment intact, Mood & affect appropriate for pt's clinical situation. Dermatologic: No rashes or ulcers noted.  No changes consistent with cellulitis.  CBC Lab Results  Component Value Date   WBC 9.8 03/16/2014   HGB 16.0 03/16/2014   HCT 47.3 03/16/2014   MCV 97 03/16/2014   PLT 200 03/16/2014    BMET    Component Value Date/Time   NA 141 03/16/2014 1656   K 4.0 03/16/2014 1656   CL 106 03/16/2014 1656   CO2 29 03/16/2014 1656   GLUCOSE 79 03/16/2014 1656   BUN 20 09/03/2020 1434   BUN 16 03/16/2014 1656   CREATININE 1.27 (H) 09/03/2020 1434   CREATININE 0.97 03/16/2014 1656   CALCIUM 8.9 03/16/2014 1656   GFRNONAA 60 (L) 09/03/2020 1434   GFRNONAA >60 03/16/2014 1656   GFRAA >60 03/16/2014 1656   Estimated Creatinine Clearance: 69.3 mL/min (A) (by C-G formula based on SCr of 1.27 mg/dL (H)).  COAG No results found for: INR, PROTIME  Radiology PERIPHERAL VASCULAR CATHETERIZATION  Result Date: 08/27/2020 See op note  VAS US ABI WITH/WO TBI  Result Date: 09/02/2020 LOWER EXTREMITY DOPPLER STUDY Indications: Rest pain.  Vascular Interventions: 08/27/2020: Crosser Atherectomy of the Left SFA and                         Popliteal Artery. PTA and Stent placement of the Left                         SFA and Popliteal Artery. PTA of the Profunda Femoral                         Artery. Performing Technologist: Debbe BalesSolomon Mcclary RVS  Examination Guidelines: A complete evaluation includes at minimum, Doppler waveform signals and systolic blood pressure reading at the level of bilateral brachial, anterior tibial, and posterior tibial arteries, when vessel  segments are accessible. Bilateral testing is considered an integral part of a complete examination. Photoelectric Plethysmograph (PPG) waveforms and toe systolic pressure readings are included as required and additional duplex testing as needed. Limited examinations for reoccurring indications may be performed as noted.  ABI Findings: +---------+------------------+-----+--------+--------+ Right    Rt Pressure (mmHg)IndexWaveformComment  +---------+------------------+-----+--------+--------+ Brachial 152                                     +---------+------------------+-----+--------+--------+  ATA      107               0.70                  +---------+------------------+-----+--------+--------+ PTA      101               0.66                  +---------+------------------+-----+--------+--------+ Great Toe59                0.39                  +---------+------------------+-----+--------+--------+ +---------+------------------+-----+--------+-------+ Left     Lt Pressure (mmHg)IndexWaveformComment +---------+------------------+-----+--------+-------+ Brachial 152                                    +---------+------------------+-----+--------+-------+ ATA      40                0.26                 +---------+------------------+-----+--------+-------+ PTA      67                0.44                 +---------+------------------+-----+--------+-------+ Great Toe0                 0.00                 +---------+------------------+-----+--------+-------+ +-------+-----------+-----------+------------+------------+ ABI/TBIToday's ABIToday's TBIPrevious ABIPrevious TBI +-------+-----------+-----------+------------+------------+ Right  .70        .39                                 +-------+-----------+-----------+------------+------------+ Left   .44        0.0                                  +-------+-----------+-----------+------------+------------+  Summary: Right: Resting right ankle-brachial index indicates moderate right lower extremity arterial disease. The right toe-brachial index is abnormal. Imaged and obtained Waveforms at the level of the Ankle. Left: Resting left ankle-brachial index indicates severe left lower extremity arterial disease. The left toe-brachial index is abnormal. Imaged and obtained Waveforms at the level of the Ankle. Imaged the CFA,SFA and Popliteal Artery as Well, No flow seen in the SFA and Popliteal Artery.  *See table(s) above for measurements and observations.  Electronically signed by Levora Dredge MD on 09/02/2020 at 5:08:33 PM.    Final      Assessment/Plan 1. 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  His intervention from last week has gone down and should be reopened while the thrombus is fresh.  This represents a limb threatening ischemia and places the patient at the risk for left limb loss.  Patient should undergo angiography of the lower extremities with the hope for intervention for limb salvage.  The risks and benefits as well as the alternative therapies was discussed in detail with the patient.  All questions  were answered.  Patient agrees to proceed with left leg angiography.  The patient will follow up with me in the office after the procedure.   2. Essential (primary) hypertension Continue antihypertensive medications as already ordered, these medications have been reviewed and there are no changes at this time.   3. Type 2 diabetes mellitus with hyperglycemia, without long-term current use of insulin (HCC) Continue hypoglycemic medications as already ordered, these medications have been reviewed  and there are no changes at this time.  Hgb A1C to be monitored as already arranged by primary service     Levora Dredge, MD  09/03/2020 3:03 PM

## 2020-09-03 NOTE — Op Note (Signed)
[facility]  Percutaneous Study/Intervention Procedural Note   Date of Surgery: 09/03/2020,6:19 PM  Surgeon:Trevante Tennell, Latina Craver   Pre-operative Diagnosis: Atherosclerotic occlusive disease bilateral lower extremities with rest pain of the left lower extremity; complication vascular device with thrombosis of superficial femoral artery and popliteal artery stents  Post-operative diagnosis:  Same  Procedure(s) Performed:  1.  Left lower extremity distal runoff third order catheter placement  2.  Star close right common femoral artery   Anesthesia: Conscious sedation was administered by the interventional radiology RN under my direct supervision. IV Versed plus fentanyl were utilized. Continuous ECG, pulse oximetry and blood pressure was monitored throughout the entire procedure.  Conscious sedation was administered for a total of 1 hour 48 minutes.  Sheath: 6 french ansel right common femoral  Contrast: 65 cc   Fluoroscopy Time: 16.0 minutes  Indications:  The patient presents to [facility] with increasing rest pain of the left foot.  Pedal pulses are nonpalpable bilaterally suggesting atherosclerotic occlusive disease.  The risks and benefits as well as alternative therapies for lower extremity revascularization are reviewed with the patient all questions are answered the patient agrees to proceed.  The patient is therefore undergoing angiography with the hope for intervention for limb salvage.   Procedure:  Carl Norris is a 64 y.o. male who was identified and appropriate procedural time out was performed.  The patient was then placed supine on the table and prepped and draped in the usual sterile fashion.  Ultrasound was used to evaluate the right common femoral artery.  It was echolucent and pulsatile indicating it is patent .  An ultrasound image was acquired for the permanent record.  A micropuncture needle was used to access the right common femoral  artery under direct ultrasound guidance.  The microwire was then advanced under fluoroscopic guidance without difficulty followed by the micro-sheath.  A 0.035 J wire was advanced without resistance and a 5Fr sheath was placed.    Pigtail catheter was positioned to above the bifurcation and RAO view of the pelvis was obtained. Stiff angled Glidewire and pigtail catheter was then used across the bifurcation and the catheter was positioned in the distal external iliac artery.  LAO of the left groin was then obtained. Wire was reintroduced and negotiated into the SFA and the catheter was advanced into the SFA. Distal runoff was then performed.  Attempts at accessing the SFA were not successful and I elected to terminate the procedure at this point and will move forward with open surgical correction.  After review of the images the catheter was removed over wire and an right view of the groin was obtained. StarClose device was deployed without difficulty.   Findings:   Aortogram: The distal aorta and the aortic bifurcation are widely patent.  Bilateral common iliac internal and external iliac arteries demonstrate mild disease but are widely patent no evidence of hemodynamically significant stenosis  Right Lower Extremity: The right common femoral is widely patent there is a smooth 20 to 25% narrowing just distal to the circumflex branches  Left Lower Extremity: The left common femoral is patent.  No evidence of a hemodynamically significant stenosis.  The left profunda femoris demonstrates a 30 to 40% stenosis at its origin distally the previously noted string sign which was treated with a Lutonix balloon angioplasty remains widely patent.  There is a small cul-de-sac at the origin of the SFA the proximal stents as well as the midportion and the popliteal stent are  thrombosed.  There is reconstitution of the below-knee popliteal.  Trifurcation remains patent anterior tibial occludes shortly after its origin  tibioperoneal trunk posterior tibial and peroneal arteries are widely patent to the ankle with the posterior tibial filling both the lateral and medial plantar vessels and the pedal arch.   Disposition: Patient was taken to the recovery room in stable condition having tolerated the procedure well.  Carl Norris 09/03/2020,6:19 PM

## 2020-09-03 NOTE — Interval H&P Note (Signed)
History and Physical Interval Note:  09/03/2020 3:07 PM  Carl Norris  has presented today for surgery, with the diagnosis of LT Leg Thrombectomy   DVT   PENUMBRA Rep  cc: M Godley, S Willey Pt to have Covid test on 09-03-20.  The various methods of treatment have been discussed with the patient and family. After consideration of risks, benefits and other options for treatment, the patient has consented to  Procedure(s): LOWER EXTREMITY ANGIOGRAPHY (Left) as a surgical intervention.  The patient's history has been reviewed, patient examined, no change in status, stable for surgery.  I have reviewed the patient's chart and labs.  Questions were answered to the patient's satisfaction.     Levora Dredge

## 2020-09-03 NOTE — Progress Notes (Signed)
  MRN : [id number]  Carl Norris is a 63 y.o. (04/13/1956) male who presents with chief complaint of  Chief Complaint  Patient presents with  . Follow-up    left calf pain and cold foot  .  History of Present Illness: The patient returns to the office sooner than expected with complaint of increasing pain of the left lower extremity.  He is status post revascularization of his SFA with angioplasty and stent placement.  He notes initially his leg felt much better but then approximately 24 to 48 hours after the procedure it became cold and started hurting and his short distance claudication rest pain symptoms returned.  Duplex ultrasound obtained in the office today demonstrates reocclusion of the SFA stents.  Current Meds  Medication Sig  . aspirin EC 81 MG tablet Take 1 tablet (81 mg total) by mouth daily. Swallow whole.  . atorvastatin (LIPITOR) 10 MG tablet Take 1 tablet (10 mg total) by mouth daily.  . clopidogrel (PLAVIX) 75 MG tablet Take 1 tablet (75 mg total) by mouth daily.  . hydrochlorothiazide (HYDRODIURIL) 25 MG tablet Take 25 mg by mouth daily.   . metoprolol succinate (TOPROL-XL) 100 MG 24 hr tablet Take 100 mg by mouth daily.   . naproxen sodium (ALEVE) 220 MG tablet Take 440 mg by mouth daily.   . vitamin B-12 (CYANOCOBALAMIN) 1000 MCG tablet Take 1,000 mcg by mouth daily.    Past Medical History:  Diagnosis Date  . Hypertension   . Peripheral vascular disease (HCC)     Past Surgical History:  Procedure Laterality Date  . LOWER EXTREMITY ANGIOGRAPHY Left 08/27/2020   Procedure: LOWER EXTREMITY ANGIOGRAPHY;  Surgeon: Kaiah Hosea G, MD;  Location: [facility] INVASIVE CV LAB;  Service: Cardiovascular;  Laterality: Left;  . NECK SURGERY      Social History Social History   Tobacco Use  . Smoking status: Current Every Day Smoker    Packs/day: 2.00    Years: 47.00    Pack years: 94.00    Types: Cigarettes  . Smokeless tobacco: Never Used  . Tobacco  comment: SMOKED TODAY   Substance Use Topics  . Alcohol use: Yes    Comment: socially  . Drug use: Never    Family History Family History  Problem Relation Age of Onset  . Cancer Father   . Vascular Disease Sister     Allergies  Allergen Reactions  . Oxycodone-Acetaminophen Itching     REVIEW OF SYSTEMS (Negative unless checked)  Constitutional: []Weight loss  []Fever  []Chills Cardiac: []Chest pain   []Chest pressure   []Palpitations   []Shortness of breath when laying flat   []Shortness of breath with exertion. Vascular:  [x]Pain in legs with walking   [x]Pain in legs at rest  []History of DVT   []Phlebitis   []Swelling in legs   []Varicose veins   []Non-healing ulcers Pulmonary:   []Uses home oxygen   []Productive cough   []Hemoptysis   []Wheeze  []COPD   []Asthma Neurologic:  []Dizziness   []Seizures   []History of stroke   []History of TIA  []Aphasia   []Vissual changes   []Weakness or numbness in arm   []Weakness or numbness in leg Musculoskeletal:   []Joint swelling   []Joint pain   []Low back pain Hematologic:  []Easy bruising  []Easy bleeding   []Hypercoagulable state   []Anemic Gastrointestinal:  []Diarrhea   []Vomiting  []Gastroesophageal reflux/heartburn   []Difficulty swallowing. Genitourinary:  []Chronic kidney disease   []  Difficult urination  []Frequent urination   []Blood in urine Skin:  []Rashes   []Ulcers  Psychological:  []History of anxiety   [] History of major depression.  Physical Examination  Vitals:   09/02/20 1500  BP: 124/73  Pulse: 73  Resp: 16  Weight: 212 lb 6.4 oz (96.3 kg)   Body mass index is 30.48 kg/m. Gen: WD/WN, NAD Head: [place]/AT, No temporalis wasting.  Ear/Nose/Throat: Hearing grossly intact, nares w/o erythema or drainage Eyes: PER, EOMI, sclera nonicteric.  Neck: Supple, no large masses.   Pulmonary:  Good air movement, no audible wheezing bilaterally, no use of accessory muscles.  Cardiac: RRR, no JVD Vascular:  Vessel Right  Left  Radial Palpable Palpable  PT Not Palpable Not Palpable  DP Not Palpable Not Palpable  Gastrointestinal: Non-distended. No guarding/no peritoneal signs.  Musculoskeletal: M/S 5/5 throughout.  No deformity or atrophy.  Neurologic: CN 2-12 intact. Symmetrical.  Speech is fluent. Motor exam as listed above. Psychiatric: Judgment intact, Mood & affect appropriate for pt's clinical situation. Dermatologic: No rashes or ulcers noted.  No changes consistent with cellulitis.  CBC Lab Results  Component Value Date   WBC 9.8 03/16/2014   HGB 16.0 03/16/2014   HCT 47.3 03/16/2014   MCV 97 03/16/2014   PLT 200 03/16/2014    BMET    Component Value Date/Time   NA 141 03/16/2014 1656   K 4.0 03/16/2014 1656   CL 106 03/16/2014 1656   CO2 29 03/16/2014 1656   GLUCOSE 79 03/16/2014 1656   BUN 20 09/03/2020 1434   BUN 16 03/16/2014 1656   CREATININE 1.27 (H) 09/03/2020 1434   CREATININE 0.97 03/16/2014 1656   CALCIUM 8.9 03/16/2014 1656   GFRNONAA 60 (L) 09/03/2020 1434   GFRNONAA >60 03/16/2014 1656   GFRAA >60 03/16/2014 1656   Estimated Creatinine Clearance: 69.3 mL/min (A) (by C-G formula based on SCr of 1.27 mg/dL (H)).  COAG No results found for: INR, PROTIME  Radiology PERIPHERAL VASCULAR CATHETERIZATION  Result Date: 08/27/2020 See op note  VAS US ABI WITH/WO TBI  Result Date: 09/02/2020 LOWER EXTREMITY DOPPLER STUDY Indications: Rest pain.  Vascular Interventions: 08/27/2020: Crosser Atherectomy of the Left SFA and                         Popliteal Artery. PTA and Stent placement of the Left                         SFA and Popliteal Artery. PTA of the Profunda Femoral                         Artery. Performing Technologist: Solomon Mcclary RVS  Examination Guidelines: A complete evaluation includes at minimum, Doppler waveform signals and systolic blood pressure reading at the level of bilateral brachial, anterior tibial, and posterior tibial arteries, when vessel  segments are accessible. Bilateral testing is considered an integral part of a complete examination. Photoelectric Plethysmograph (PPG) waveforms and toe systolic pressure readings are included as required and additional duplex testing as needed. Limited examinations for reoccurring indications may be performed as noted.  ABI Findings: +---------+------------------+-----+--------+--------+ Right    Rt Pressure (mmHg)IndexWaveformComment  +---------+------------------+-----+--------+--------+ Brachial 152                                     +---------+------------------+-----+--------+--------+   ATA      107               0.70                  +---------+------------------+-----+--------+--------+ PTA      101               0.66                  +---------+------------------+-----+--------+--------+ Great Toe59                0.39                  +---------+------------------+-----+--------+--------+ +---------+------------------+-----+--------+-------+ Left     Lt Pressure (mmHg)IndexWaveformComment +---------+------------------+-----+--------+-------+ Brachial 152                                    +---------+------------------+-----+--------+-------+ ATA      40                0.26                 +---------+------------------+-----+--------+-------+ PTA      67                0.44                 +---------+------------------+-----+--------+-------+ Great Toe0                 0.00                 +---------+------------------+-----+--------+-------+ +-------+-----------+-----------+------------+------------+ ABI/TBIToday's ABIToday's TBIPrevious ABIPrevious TBI +-------+-----------+-----------+------------+------------+ Right  .70        .39                                 +-------+-----------+-----------+------------+------------+ Left   .44        0.0                                  +-------+-----------+-----------+------------+------------+  Summary: Right: Resting right ankle-brachial index indicates moderate right lower extremity arterial disease. The right toe-brachial index is abnormal. Imaged and obtained Waveforms at the level of the Ankle. Left: Resting left ankle-brachial index indicates severe left lower extremity arterial disease. The left toe-brachial index is abnormal. Imaged and obtained Waveforms at the level of the Ankle. Imaged the CFA,SFA and Popliteal Artery as Well, No flow seen in the SFA and Popliteal Artery.  *See table(s) above for measurements and observations.  Electronically signed by Floyce Bujak MD on 09/02/2020 at 5:08:33 PM.    Final      Assessment/Plan 1. 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  His intervention from last week has gone down and should be reopened while the thrombus is fresh.  This represents a limb threatening ischemia and places the patient at the risk for left limb loss.  Patient should undergo angiography of the lower extremities with the hope for intervention for limb salvage.  The risks and benefits as well as the alternative therapies was discussed in detail with the patient.  All questions   were answered.  Patient agrees to proceed with left leg angiography.  The patient will follow up with me in the office after the procedure.   2. Essential (primary) hypertension Continue antihypertensive medications as already ordered, these medications have been reviewed and there are no changes at this time.   3. Type 2 diabetes mellitus with hyperglycemia, without long-term current use of insulin (HCC) Continue hypoglycemic medications as already ordered, these medications have been reviewed  and there are no changes at this time.  Hgb A1C to be monitored as already arranged by primary service     Leslie Jester, MD  09/03/2020 3:03 PM 

## 2020-09-03 NOTE — Progress Notes (Signed)
ANTICOAGULATION CONSULT NOTE - Initial Consult  Pharmacy Consult for Heparin Drip Indication: reocclusion of SFA stents  Allergies  Allergen Reactions   Oxycodone-Acetaminophen Itching    Patient Measurements: Height: 5\' 10"  (177.8 cm) Weight: 96.3 kg (212 lb 4.9 oz) IBW/kg (Calculated) : 73 Heparin Dosing Weight: 92.8 kg  Vital Signs: Temp: 98.2 F (36.8 C) (10/19 1413) Temp Source: Oral (10/19 1413) BP: 160/63 (10/19 1730) Pulse Rate: 52 (10/19 1745)  Labs: Recent Labs    09/03/20 1434  CREATININE 1.27*    Estimated Creatinine Clearance: 69.3 mL/min (A) (by C-G formula based on SCr of 1.27 mg/dL (H)).   Medical History: Past Medical History:  Diagnosis Date   Hypertension    Peripheral vascular disease (HCC)    Assessment: Patient is a 64yo male admitted with reocclusion of SFA stents. Pharmacy is consulted for Heparin dosing. Patient was not taking any anticoagulants prior to admission. He did receive Heparin 4000 units in vascular lab.  Goal of Therapy:  Heparin level 0.3-0.7 units/ml Monitor platelets by anticoagulation protocol: Yes   Plan:  Start heparin infusion at 1650 units/hr Check anti-Xa level in 6 hours and daily while on heparin Continue to monitor H&H and platelets  64yo, PharmD, BCPS 09/03/2020 6:11 PM

## 2020-09-03 NOTE — Progress Notes (Signed)
Dr. Gilda Crease at bedside, speaking with pt. And his niece Efraim Kaufmann re: procedural results. Both verbalize understanding of conversation discussed .

## 2020-09-04 ENCOUNTER — Encounter: Payer: Self-pay | Admitting: Vascular Surgery

## 2020-09-04 ENCOUNTER — Inpatient Hospital Stay: Payer: 59

## 2020-09-04 ENCOUNTER — Inpatient Hospital Stay: Payer: 59 | Admitting: Anesthesiology

## 2020-09-04 ENCOUNTER — Encounter
Admission: RE | Disposition: A | Discharge status: Home / Self Care | Payer: Self-pay | Source: Home / Self Care | Attending: Vascular Surgery

## 2020-09-04 DIAGNOSIS — I70222 Atherosclerosis of native arteries of extremities with rest pain, left leg: Secondary | ICD-10-CM

## 2020-09-04 DIAGNOSIS — T82868A Thrombosis of vascular prosthetic devices, implants and grafts, initial encounter: Secondary | ICD-10-CM

## 2020-09-04 HISTORY — PX: THROMBECTOMY FEMORAL ARTERY: SHX6406

## 2020-09-04 LAB — CBC
HCT: 34.4 % — ABNORMAL LOW (ref 39.0–52.0)
HCT: 37.1 % — ABNORMAL LOW (ref 39.0–52.0)
Hemoglobin: 11.8 g/dL — ABNORMAL LOW (ref 13.0–17.0)
Hemoglobin: 12.9 g/dL — ABNORMAL LOW (ref 13.0–17.0)
MCH: 33.4 pg (ref 26.0–34.0)
MCH: 33.8 pg (ref 26.0–34.0)
MCHC: 34.3 g/dL (ref 30.0–36.0)
MCHC: 34.8 g/dL (ref 30.0–36.0)
MCV: 97.1 fL (ref 80.0–100.0)
MCV: 97.5 fL (ref 80.0–100.0)
Platelets: 179 10*3/uL (ref 150–400)
Platelets: 189 10*3/uL (ref 150–400)
RBC: 3.53 MIL/uL — ABNORMAL LOW (ref 4.22–5.81)
RBC: 3.82 MIL/uL — ABNORMAL LOW (ref 4.22–5.81)
RDW: 12.5 % (ref 11.5–15.5)
RDW: 12.8 % (ref 11.5–15.5)
WBC: 10.5 10*3/uL (ref 4.0–10.5)
WBC: 7.4 10*3/uL (ref 4.0–10.5)
nRBC: 0 % (ref 0.0–0.2)
nRBC: 0 % (ref 0.0–0.2)

## 2020-09-04 LAB — HEPARIN LEVEL (UNFRACTIONATED)
Heparin Unfractionated: 0.47 IU/mL (ref 0.30–0.70)
Heparin Unfractionated: 0.69 IU/mL (ref 0.30–0.70)
Heparin Unfractionated: 0.64 IU/mL (ref 0.30–0.70)

## 2020-09-04 LAB — GLUCOSE, CAPILLARY: Glucose-Capillary: 157 mg/dL — ABNORMAL HIGH (ref 70–99)

## 2020-09-04 LAB — MRSA PCR SCREENING: MRSA by PCR: NEGATIVE

## 2020-09-04 SURGERY — THROMBECTOMY, ARTERY, FEMORAL
Anesthesia: General | Laterality: Left

## 2020-09-04 MED ORDER — ONDANSETRON HCL 4 MG/2ML IJ SOLN
INTRAMUSCULAR | Status: AC
  Filled 2020-09-04: qty 2

## 2020-09-04 MED ORDER — PROPOFOL 10 MG/ML IV BOLUS
INTRAVENOUS | Status: DC | PRN
  Administered 2020-09-04: 40 mg via INTRAVENOUS
  Administered 2020-09-04: 120 mg via INTRAVENOUS

## 2020-09-04 MED ORDER — FENTANYL CITRATE (PF) 100 MCG/2ML IJ SOLN
INTRAMUSCULAR | Status: AC
  Administered 2020-09-04: 25 ug via INTRAVENOUS
  Filled 2020-09-04: qty 2

## 2020-09-04 MED ORDER — FIBRIN SEALANT 2 ML SINGLE DOSE KIT
PACK | CUTANEOUS | Status: DC | PRN
  Administered 2020-09-04: 10 mL via TOPICAL

## 2020-09-04 MED ORDER — FENTANYL CITRATE (PF) 100 MCG/2ML IJ SOLN
INTRAMUSCULAR | Status: AC
  Filled 2020-09-04: qty 2

## 2020-09-04 MED ORDER — HEPARIN SODIUM (PORCINE) 1000 UNIT/ML IJ SOLN
INTRAMUSCULAR | Status: DC | PRN
  Administered 2020-09-04: 5000 [IU] via INTRAVENOUS

## 2020-09-04 MED ORDER — LACTATED RINGERS IV SOLN: INTRAVENOUS | Status: DC | PRN

## 2020-09-04 MED ORDER — HEPARIN SODIUM (PORCINE) 5000 UNIT/ML IJ SOLN
INTRAMUSCULAR | Status: AC
  Filled 2020-09-04: qty 1

## 2020-09-04 MED ORDER — SODIUM CHLORIDE 0.9 % IV SOLN
INTRAVENOUS | Status: DC | PRN
  Administered 2020-09-04: 30 ug/min via INTRAVENOUS

## 2020-09-04 MED ORDER — MORPHINE SULFATE (PF) 2 MG/ML IV SOLN
2.0000 mg | INTRAVENOUS | Status: DC | PRN
  Administered 2020-09-04: 4 mg via INTRAVENOUS
  Administered 2020-09-05 – 2020-09-10 (×6): 2 mg via INTRAVENOUS
  Filled 2020-09-04 (×4): qty 1
  Filled 2020-09-04: qty 2
  Filled 2020-09-04 (×2): qty 1

## 2020-09-04 MED ORDER — DEXAMETHASONE SODIUM PHOSPHATE 10 MG/ML IJ SOLN
INTRAMUSCULAR | Status: DC | PRN
  Administered 2020-09-04: 10 mg via INTRAVENOUS

## 2020-09-04 MED ORDER — DIPHENHYDRAMINE HCL 50 MG/ML IJ SOLN
INTRAMUSCULAR | Status: DC | PRN
  Administered 2020-09-04: 50 mg via INTRAVENOUS

## 2020-09-04 MED ORDER — MIDAZOLAM HCL 2 MG/2ML IJ SOLN
INTRAMUSCULAR | Status: DC | PRN
  Administered 2020-09-04: 2 mg via INTRAVENOUS

## 2020-09-04 MED ORDER — BUPIVACAINE LIPOSOME 1.3 % IJ SUSP
INTRAMUSCULAR | Status: DC | PRN
  Administered 2020-09-04: 20 mL

## 2020-09-04 MED ORDER — HYDROCODONE-ACETAMINOPHEN 7.5-325 MG PO TABS
1.0000 | ORAL_TABLET | Freq: Four times a day (QID) | ORAL | Status: DC | PRN
  Administered 2020-09-04 – 2020-09-05 (×2): 2 via ORAL
  Administered 2020-09-05: 1 via ORAL
  Administered 2020-09-05 – 2020-09-06 (×3): 2 via ORAL
  Administered 2020-09-06 – 2020-09-07 (×3): 1 via ORAL
  Administered 2020-09-08: 2 via ORAL
  Administered 2020-09-09 (×3): 1 via ORAL
  Administered 2020-09-10: 2 via ORAL
  Filled 2020-09-04: qty 1
  Filled 2020-09-04 (×4): qty 2
  Filled 2020-09-04 (×6): qty 1
  Filled 2020-09-04: qty 2
  Filled 2020-09-04 (×4): qty 1

## 2020-09-04 MED ORDER — BUPIVACAINE HCL (PF) 0.5 % IJ SOLN
INTRAMUSCULAR | Status: AC
  Filled 2020-09-04: qty 30

## 2020-09-04 MED ORDER — BUPIVACAINE HCL (PF) 0.5 % IJ SOLN
INTRAMUSCULAR | Status: DC | PRN
  Administered 2020-09-04: 20 mL

## 2020-09-04 MED ORDER — GLYCOPYRROLATE 0.2 MG/ML IJ SOLN
INTRAMUSCULAR | Status: DC | PRN
  Administered 2020-09-04: .2 mg via INTRAVENOUS

## 2020-09-04 MED ORDER — SODIUM CHLORIDE 0.9 % IV SOLN
INTRAVENOUS | Status: DC | PRN
  Administered 2020-09-04: 250 mL via INTRAMUSCULAR

## 2020-09-04 MED ORDER — CEFAZOLIN SODIUM-DEXTROSE 1-4 GM/50ML-% IV SOLN
1.0000 g | Freq: Three times a day (TID) | INTRAVENOUS | Status: AC
  Administered 2020-09-05 (×3): 1 g via INTRAVENOUS
  Filled 2020-09-04 (×3): qty 50

## 2020-09-04 MED ORDER — ONDANSETRON HCL 4 MG/2ML IJ SOLN
4.0000 mg | Freq: Four times a day (QID) | INTRAMUSCULAR | Status: DC | PRN
  Administered 2020-09-08: 4 mg via INTRAVENOUS
  Filled 2020-09-04: qty 2

## 2020-09-04 MED ORDER — SODIUM CHLORIDE 0.9% FLUSH
3.0000 mL | Freq: Two times a day (BID) | INTRAVENOUS | Status: DC
  Administered 2020-09-04 – 2020-09-10 (×8): 3 mL via INTRAVENOUS

## 2020-09-04 MED ORDER — MIDAZOLAM HCL 2 MG/2ML IJ SOLN
INTRAMUSCULAR | Status: AC
  Filled 2020-09-04: qty 2

## 2020-09-04 MED ORDER — CEFAZOLIN SODIUM-DEXTROSE 2-4 GM/100ML-% IV SOLN
INTRAVENOUS | Status: AC
  Filled 2020-09-04: qty 100

## 2020-09-04 MED ORDER — ONDANSETRON HCL 4 MG/2ML IJ SOLN
4.0000 mg | Freq: Once | INTRAMUSCULAR | Status: AC | PRN
  Administered 2020-09-04: 4 mg via INTRAVENOUS

## 2020-09-04 MED ORDER — SODIUM CHLORIDE 0.9 % IV SOLN: 250.0000 mL | INTRAVENOUS | Status: DC | PRN

## 2020-09-04 MED ORDER — BUPIVACAINE LIPOSOME 1.3 % IJ SUSP
INTRAMUSCULAR | Status: AC
  Filled 2020-09-04: qty 20

## 2020-09-04 MED ORDER — LIDOCAINE HCL (CARDIAC) PF 100 MG/5ML IV SOSY
PREFILLED_SYRINGE | INTRAVENOUS | Status: DC | PRN
  Administered 2020-09-04: 100 mg via INTRAVENOUS

## 2020-09-04 MED ORDER — PHENYLEPHRINE HCL (PRESSORS) 10 MG/ML IV SOLN
INTRAVENOUS | Status: DC | PRN
  Administered 2020-09-04 (×3): 100 ug via INTRAVENOUS

## 2020-09-04 MED ORDER — CEFAZOLIN SODIUM-DEXTROSE 2-4 GM/100ML-% IV SOLN
2.0000 g | INTRAVENOUS | Status: AC
  Administered 2020-09-04: 2 g via INTRAVENOUS

## 2020-09-04 MED ORDER — CEFAZOLIN SODIUM-DEXTROSE 2-4 GM/100ML-% IV SOLN: 2.0000 g | Freq: Once | INTRAVENOUS | Status: DC

## 2020-09-04 MED ORDER — FENTANYL CITRATE (PF) 100 MCG/2ML IJ SOLN
25.0000 ug | INTRAMUSCULAR | Status: DC | PRN
  Administered 2020-09-04: 25 ug via INTRAVENOUS

## 2020-09-04 MED ORDER — SODIUM CHLORIDE 0.9% FLUSH: 3.0000 mL | INTRAVENOUS | Status: DC | PRN

## 2020-09-04 MED ORDER — SODIUM CHLORIDE 0.9 % IV SOLN: INTRAVENOUS | Status: AC

## 2020-09-04 MED ORDER — ONDANSETRON HCL 4 MG/2ML IJ SOLN
INTRAMUSCULAR | Status: DC | PRN
  Administered 2020-09-04 (×2): 4 mg via INTRAVENOUS

## 2020-09-04 MED ORDER — FENTANYL CITRATE (PF) 100 MCG/2ML IJ SOLN
INTRAMUSCULAR | Status: DC | PRN
Start: 2020-09-04 — End: 2020-09-04
  Administered 2020-09-04: 50 ug via INTRAVENOUS
  Administered 2020-09-04 (×2): 25 ug via INTRAVENOUS
  Administered 2020-09-04: 50 ug via INTRAVENOUS

## 2020-09-04 SURGICAL SUPPLY — 85 items
APPLIER CLIP 11 MED OPEN (CLIP)
APPLIER CLIP 13 LRG OPEN (CLIP)
APPLIER CLIP 9.375 SM OPEN (CLIP)
BAG DECANTER FOR FLEXI CONT (MISCELLANEOUS) ×2 IMPLANT
BAG ISOLATATION DRAPE 20X20 ST (DRAPES) ×1 IMPLANT
BALLN DORADO 7X200X80 (BALLOONS) ×2
BALLOON DORADO 7X200X80 (BALLOONS) IMPLANT
BLADE SURG SZ11 CARB STEEL (BLADE) ×2 IMPLANT
BOOT SUTURE AID YELLOW STND (SUTURE) ×2 IMPLANT
BRUSH SCRUB EZ  4% CHG (MISCELLANEOUS) ×2
BRUSH SCRUB EZ 4% CHG (MISCELLANEOUS) ×1 IMPLANT
CANISTER SUCT 1200ML W/VALVE (MISCELLANEOUS) ×2 IMPLANT
CANNULA 5F STIFF (CANNULA) ×1 IMPLANT
CATH BEACON 5 .035 40 KMP TP (CATHETERS) IMPLANT
CATH BEACON 5 .035 65 KMP TIP (CATHETERS) ×1 IMPLANT
CATH BEACON 5 .038 40 KMP TP (CATHETERS) ×2
CATH EMBOLECTOMY 3X80 (CATHETERS) ×2 IMPLANT
CHLORAPREP W/TINT 26 (MISCELLANEOUS) ×3 IMPLANT
CLIP APPLIE 11 MED OPEN (CLIP) IMPLANT
CLIP APPLIE 13 LRG OPEN (CLIP) IMPLANT
CLIP APPLIE 9.375 SM OPEN (CLIP) IMPLANT
COVER WAND RF STERILE (DRAPES) ×2 IMPLANT
DERMABOND ADVANCED (GAUZE/BANDAGES/DRESSINGS) ×1
DERMABOND ADVANCED .7 DNX12 (GAUZE/BANDAGES/DRESSINGS) ×1 IMPLANT
DRAPE 3/4 80X56 (DRAPES) ×1 IMPLANT
DRAPE INCISE IOBAN 66X45 STRL (DRAPES) ×2 IMPLANT
DRAPE ISOLATE BAG 20X20 STRL (DRAPES)
DRAPE SPLIT 6X30 W/TAPE (DRAPES) ×1 IMPLANT
DRESSING SURGICEL FIBRLLR 1X2 (HEMOSTASIS) ×1 IMPLANT
DRSG OPSITE POSTOP 4X6 (GAUZE/BANDAGES/DRESSINGS) ×1 IMPLANT
DRSG SURGICEL FIBRILLAR 1X2 (HEMOSTASIS) ×2
ELECT CAUTERY BLADE 6.4 (BLADE) ×2 IMPLANT
ELECT REM PT RETURN 9FT ADLT (ELECTROSURGICAL) ×2
ELECTRODE REM PT RTRN 9FT ADLT (ELECTROSURGICAL) ×1 IMPLANT
GLIDEWIRE ADV .035X180CM (WIRE) ×1 IMPLANT
GLOVE BIO SURGEON STRL SZ7 (GLOVE) ×1 IMPLANT
GLOVE SURG SYN 8.0 (GLOVE) ×6 IMPLANT
GLOVE SURG SYN 8.0 PF PI (GLOVE) ×1 IMPLANT
GOWN STRL REUS W/ TWL LRG LVL3 (GOWN DISPOSABLE) ×1 IMPLANT
GOWN STRL REUS W/ TWL XL LVL3 (GOWN DISPOSABLE) ×1 IMPLANT
GOWN STRL REUS W/TWL LRG LVL3 (GOWN DISPOSABLE) ×2
GOWN STRL REUS W/TWL XL LVL3 (GOWN DISPOSABLE) ×8
IV NS 500ML (IV SOLUTION) ×2
IV NS 500ML BAXH (IV SOLUTION) ×1 IMPLANT
KIT ENCORE 26 ADVANTAGE (KITS) ×1 IMPLANT
KIT TURNOVER KIT A (KITS) ×2 IMPLANT
LABEL OR SOLS (LABEL) ×2 IMPLANT
LOOP RED MAXI  1X406MM (MISCELLANEOUS) ×3
LOOP VESSEL MAXI  1X406 RED (MISCELLANEOUS) ×3
LOOP VESSEL MAXI 1X406 RED (MISCELLANEOUS) ×3 IMPLANT
LOOP VESSEL MINI 0.8X406 BLUE (MISCELLANEOUS) ×2 IMPLANT
LOOPS BLUE MINI 0.8X406MM (MISCELLANEOUS) ×2
NDL ENTRY 21GA 7CM ECHOTIP (NEEDLE) IMPLANT
NDL HYPO 18GX1.5 BLUNT FILL (NEEDLE) ×1 IMPLANT
NDL HYPO 25X1 1.5 SAFETY (NEEDLE) IMPLANT
NEEDLE ENTRY 21GA 7CM ECHOTIP (NEEDLE) ×2 IMPLANT
NEEDLE HYPO 18GX1.5 BLUNT FILL (NEEDLE) ×2 IMPLANT
NEEDLE HYPO 25X1 1.5 SAFETY (NEEDLE) ×2 IMPLANT
NS IRRIG 1000ML POUR BTL (IV SOLUTION) ×2 IMPLANT
PACK BASIN MAJOR ARMC (MISCELLANEOUS) ×2 IMPLANT
PACK UNIVERSAL (MISCELLANEOUS) ×2 IMPLANT
PATCH CAROTID ECM VASC 1X10 (Prosthesis & Implant Heart) ×1 IMPLANT
SET INTRO CAPELLA COAXIAL (SET/KITS/TRAYS/PACK) ×1 IMPLANT
SHEATH BRITE TIP 8FRX11 (SHEATH) ×1 IMPLANT
STENT VIABAHN 7X25X120 (Permanent Stent) ×2 IMPLANT
STOCKINETTE M/LG 89821 (MISCELLANEOUS) ×1 IMPLANT
SUT MNCRL+ 5-0 UNDYED PC-3 (SUTURE) ×1 IMPLANT
SUT MONOCRYL 5-0 (SUTURE) ×2
SUT PROLENE 5 0 RB 1 DA (SUTURE) ×4 IMPLANT
SUT PROLENE 6 0 BV (SUTURE) ×8 IMPLANT
SUT SILK 2 0 (SUTURE) ×2
SUT SILK 2-0 18XBRD TIE 12 (SUTURE) ×1 IMPLANT
SUT SILK 3 0 (SUTURE) ×2
SUT SILK 3-0 18XBRD TIE 12 (SUTURE) ×1 IMPLANT
SUT SILK 4 0 (SUTURE) ×2
SUT SILK 4-0 18XBRD TIE 12 (SUTURE) ×1 IMPLANT
SUT VIC AB 2-0 CT1 27 (SUTURE) ×4
SUT VIC AB 2-0 CT1 TAPERPNT 27 (SUTURE) ×2 IMPLANT
SUT VIC AB 3-0 SH 27 (SUTURE) ×2
SUT VIC AB 3-0 SH 27X BRD (SUTURE) ×1 IMPLANT
SUT VICRYL+ 3-0 36IN CT-1 (SUTURE) ×4 IMPLANT
SYR 20ML LL LF (SYRINGE) ×3 IMPLANT
SYR 3ML LL SCALE MARK (SYRINGE) ×3 IMPLANT
SYR TB 1ML 27GX1/2 LL (SYRINGE) ×2 IMPLANT
WIRE G V18X300CM (WIRE) ×1 IMPLANT

## 2020-09-04 NOTE — Progress Notes (Signed)
ANTICOAGULATION CONSULT NOTE  Pharmacy Consult for Heparin Drip Indication: reocclusion of SFA stents  Patient Measurements: Height: 5\' 10"  (177.8 cm) Weight: 96.3 kg (212 lb 4.9 oz) IBW/kg (Calculated) : 73 Heparin Dosing Weight: 92.8 kg  Vital Signs: Temp: 98.1 F (36.7 C) (10/20 0540) Temp Source: Oral (10/19 2044) BP: 138/58 (10/20 0540) Pulse Rate: 59 (10/20 0540)  Labs: Recent Labs    09/03/20 1434 09/04/20 0023  HGB  --  12.9*  HCT  --  37.1*  PLT  --  189  HEPARINUNFRC  --  0.47  CREATININE 1.27*  --     Estimated Creatinine Clearance: 69.3 mL/min (A) (by C-G formula based on SCr of 1.27 mg/dL (H)).   Medical History: Past Medical History:  Diagnosis Date  . Hypertension   . Peripheral vascular disease [facility])    Assessment: Patient is a 64yo male admitted with reocclusion of SFA stents. Pharmacy is consulted for Heparin dosing. Patient was not taking any anticoagulants prior to admission. H&H below normal limits, platelets wnl  Heparin Course: 10/19 Initiation: 1650 units/hr 10/20 0023 HL 0.47: no change 10/20 0619 0.69:  Goal of Therapy:  Heparin level 0.3-0.7 units/ml Monitor platelets by anticoagulation protocol: Yes   Plan:   Anti-Xa level therapeutic but at the upper end of normal and trending up: reduce heparin infusion rate to 1550 units/hr  Check anti-Xa level in 6 hours after rate change and daily while on heparin  Continue to monitor H&H and platelets .   11/20 09/04/2020 6:56 AM

## 2020-09-04 NOTE — Anesthesia Postprocedure Evaluation (Signed)
Anesthesia Post Note  Patient: Payten Beaumier  Procedure(s) Performed: THROMBECTOMY FEMORAL ARTERY possible stent (Left )  Patient location during evaluation: PACU Anesthesia Type: General Level of consciousness: awake and alert Pain management: pain level controlled Vital Signs Assessment: post-procedure vital signs reviewed and stable Respiratory status: spontaneous breathing, nonlabored ventilation and respiratory function stable Cardiovascular status: blood pressure returned to baseline and stable Postop Assessment: no apparent nausea or vomiting Anesthetic complications: no   No complications documented.   Last Vitals:  Vitals:   09/04/20 1957 09/04/20 2004  BP: (!) 98/58   Pulse: 65 64  Resp: 15 13  Temp:    SpO2: 95% 97%    Last Pain:  Vitals:   09/04/20 1957  TempSrc:   PainSc: 0-No pain                 Aurelio Brash Jas Betten

## 2020-09-04 NOTE — Anesthesia Preprocedure Evaluation (Signed)
Anesthesia Evaluation  Patient identified by MRN, date of birth, ID band Patient awake    Reviewed: Allergy & Precautions, NPO status , Patient's Chart, lab work & pertinent test results  Airway Mallampati: II  TM Distance: >3 FB     Dental  (+) Caps   Pulmonary Current Smoker,    Pulmonary exam normal        Cardiovascular hypertension, + Peripheral Vascular Disease  Normal cardiovascular exam     Neuro/Psych negative neurological ROS  negative psych ROS   GI/Hepatic negative GI ROS, Neg liver ROS,   Endo/Other  diabetes  Renal/GU negative Renal ROS  negative genitourinary   Musculoskeletal negative musculoskeletal ROS (+)   Abdominal Normal abdominal exam  (+)   Peds negative pediatric ROS (+)  Hematology negative hematology ROS (+)   Anesthesia Other Findings Past Medical History: No date: Hypertension No date: Peripheral vascular disease (HCC)  Reproductive/Obstetrics                             Anesthesia Physical Anesthesia Plan  ASA: III  Anesthesia Plan: General   Post-op Pain Management:    Induction: Intravenous  PONV Risk Score and Plan:   Airway Management Planned: LMA  Additional Equipment:   Intra-op Plan:   Post-operative Plan: Extubation in OR  Informed Consent: I have reviewed the patients History and Physical, chart, labs and discussed the procedure including the risks, benefits and alternatives for the proposed anesthesia with the patient or authorized representative who has indicated his/her understanding and acceptance.     Dental advisory given  Plan Discussed with: CRNA and Surgeon  Anesthesia Plan Comments:         Anesthesia Quick Evaluation

## 2020-09-04 NOTE — Op Note (Signed)
OPERATIVE NOTE   PROCEDURE: 1. Left common femoral, profunda femoris, and superficial femoral artery endarterectomies and patch angioplasty 2.   3 and 4 Fogarty embolectomy to the left SFA and popliteal arteries 3.   Left lower extremity angiogram 4.   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 significant plaque in left common femoral, profunda femoris, and superficial femoral arteries 2.  Thrombosis of the left SFA and popliteal arteries  SPECIMEN(S): Left common femoral, profunda femoris, and superficial femoral artery plaque. Left SFA and popliteal thrombus  INDICATIONS:  Patient presents with early reocclusion of the recent intervention with rest pain.  Left femoral endarterectomy as well as thrombectomy or intervention to his infrainguinal disease is planned to try to improve perfusion. The risks and benefits as well as alternative therapies including intervention were reviewed in detail all questions were answered the patient agrees to proceed with surgery.  DESCRIPTION: After obtaining full informed written consent, the patient was brought back to the operating room and placed supine upon the operating table. The patient received IV antibiotics prior to induction. After obtaining adequate anesthesia, the patient was prepped and draped in the standard fashion appropriate time out is called.   Vertical incision was created overlying the left femoral arteries. The common femoral artery proximally, and superficial femoral artery, and primary profunda femoris artery  branches were encircled with vessel loops and prepared for control. The left femoral arteries were found to have significant plaque from the common femoral artery into the profunda and superficial femoral arteries.   6000 units of heparin was given and allowed circulate for 5 minutes.   Attention is then turned to the left femoral artery. An arteriotomy is made with 11 blade and extended with Potts scissors in the common femoral artery and carried down onto the first 3-4 cm of the superficial femoral artery. An endarterectomy was then performed. The [facility] was used to create a plane. The proximal endpoint was cut flush with tenotomy scissors. This was in the proximal common femoral artery. An eversion endarterectomy was then performed for the first 2-3 cm of the profunda femoris artery.  On the angiogram, there was a proximal lesion but we had to chase the plaque down fairly extensively several centimeters down into the profunda femoris artery.  This was removed with gentle traction with a hemostat. Good backbleeding was then seen.  The SFA endarterectomy was then performed.  This included removal of the proximal SFA stent and the plaque and thrombus around this.  The distal endpoint of the superficial femoral endarterectomy was created with gentle traction and the distal endpoint was fairly clean. 2 passes with a 3 Fogarty embolectomy balloon were then performed in the left SFA but this would only go to about 20 cm.  A large amount of thrombus was removed.  The Cormatrix patch is then selected and prepared for a patch angioplasty.  It is cut and beveled and started at the proximal endpoint with a 6-0 Prolene suture.  Approximately one half of the suture line is run medially and laterally and the distal end point was cut and bevelled to match the  arteriotomy.  A second 6-0 Prolene was started at the distal end point and run to the mid portion to complete the arteriotomy, leaving a gap on the medial  side to place the sheath and try to restore flow through the SFA and popliteal arteries.  An 8 French sheath was then placed.  Imaging showed occlusion and using an advantage wire and a Kumpe catheter we were able to get down into the below-knee popliteal artery and confirm intraluminal flow.  The catheter was removed.  An over-the-wire Fogarty embolectomy was then performed all the way down to the below-knee popliteal artery removing the remainder of the thrombus from the SFA and popliteal arteries.  We then exchanged for a 0.018 wire and placed 2 Viabahn stents from the below-knee popliteal artery up to the most proximal SFA at the distal edge of our patch.  These were 7 mm in diameter by 25 cm in length and were postdilated with a 7 mm balloon with excellent angiographic completion result and less than 10% residual stenosis.  The tibial vessels appeared to have good flow as well.  The sheath was removed..  The vessel was flushed prior to release of control and completion of the anastomosis.  At this point, flow was established first to the profunda femoris artery and then to the superficial femoral artery. Easily palpable pulses are noted well beyond the anastomosis and both arteries.  Fibrillar and Vistacel topical hemostatic agents were placed in the femoral incision and hemostasis was complete. The femoral incision was then closed in a layered fashion with 2 layers of 2-0 Vicryl, 2 layers of 3-0 Vicryl, and 4-0 Monocryl for the skin closure. Dermabond and sterile dressing were then placed over the incision.  The patient was then awakened from anesthesia and taken to the recovery room in stable condition having tolerated the procedure well.  COMPLICATIONS: None  CONDITION: Stable     Festus Barren 09/04/2020 7:32 PM   This note was created with Dragon Medical transcription system. Any errors in dictation are purely unintentional.

## 2020-09-04 NOTE — Anesthesia Procedure Notes (Signed)
Procedure Name: LMA Insertion Date/Time: 09/04/2020 5:13 PM Performed by: Junious Silk, CRNA Pre-anesthesia Checklist: Patient identified, Patient being monitored, Timeout performed, Emergency Drugs available and Suction available Patient Re-evaluated:Patient Re-evaluated prior to induction Oxygen Delivery Method: Circle system utilized Preoxygenation: Pre-oxygenation with 100% oxygen Induction Type: IV induction Ventilation: Mask ventilation without difficulty LMA: LMA inserted LMA Size: 4.5 Tube type: Oral Number of attempts: 1 Placement Confirmation: positive ETCO2 and breath sounds checked- equal and bilateral Tube secured with: Tape Dental Injury: Teeth and Oropharynx as per pre-operative assessment

## 2020-09-04 NOTE — Interval H&P Note (Signed)
History and Physical Interval Note:  09/04/2020 4:05 PM  Carl Norris  has presented today for surgery, with the diagnosis of ischemic left leg.  The various methods of treatment have been discussed with the patient and family. After consideration of risks, benefits and other options for treatment, the patient has consented to  Procedure(s): THROMBECTOMY FEMORAL ARTERY possible stent (Left) as a surgical intervention.  The patient's history has been reviewed, patient examined, no change in status, stable for surgery.  I have reviewed the patient's chart and labs.  Questions were answered to the patient's satisfaction.     Levora Dredge

## 2020-09-04 NOTE — Progress Notes (Signed)
ANTICOAGULATION CONSULT NOTE - Initial Consult  Pharmacy Consult for Heparin Drip Indication: reocclusion of SFA stents  Allergies  Allergen Reactions  . Oxycodone-Acetaminophen Itching    Patient Measurements: Height: 5\' 10"  (177.8 cm) Weight: 96.3 kg (212 lb 4.9 oz) IBW/kg (Calculated) : 73 Heparin Dosing Weight: 92.8 kg  Vital Signs: Temp: 98.6 F (37 C) (10/19 2044) Temp Source: Oral (10/19 2044) BP: 136/64 (10/19 2044) Pulse Rate: 60 (10/19 2044)  Labs: Recent Labs    09/03/20 1434 09/04/20 0023  HGB  --  12.9*  HCT  --  37.1*  PLT  --  189  HEPARINUNFRC  --  0.47  CREATININE 1.27*  --     Estimated Creatinine Clearance: 69.3 mL/min (A) (by C-G formula based on SCr of 1.27 mg/dL (H)).   Medical History: Past Medical History:  Diagnosis Date  . Hypertension   . Peripheral vascular disease [facility])    Assessment: Patient is a 64yo male admitted with reocclusion of SFA stents. Pharmacy is consulted for Heparin dosing. Patient was not taking any anticoagulants prior to admission. He did receive Heparin 4000 units in vascular lab.  Goal of Therapy:  Heparin level 0.3-0.7 units/ml Monitor platelets by anticoagulation protocol: Yes   Plan:  Start heparin infusion at 1650 units/hr Check anti-Xa level in 6 hours and daily while on heparin Continue to monitor H&H and platelets   10/20:  HL @ 0023 = 0.47 Will continue pt on current rate and recheck HL in 6 hrs on 10/20 @ 0600.   Qadir Folks D 09/04/2020 2:34 AM

## 2020-09-04 NOTE — Plan of Care (Signed)
The patient has been educated on the medications he is currently on. Heparin drip is currently running at this time. No plan for this charge at this time. The patient is schedule to have a procedure with vascular service today around 2:55pm today. The patient is currently NPO sips with meds at this time. IV fluids running. The patient indicates he does not have pain at this time. RN has notified the patient to notify the nurse if he has pain. The patient has remained called at this time. Consent for the patient's procedure has been completed and placed in the patient's chart. Vital signs stable at this time. Blood work currently being monitored closely. The patient uses the urina. The patient is able to swallow medications without issues. The patient request supervision when getting out of bed do to heparin drip yet independent at baseline. Bed alarm on. Surgical site on groin area.  Problem: Education: Goal: Knowledge of General Education information will improve Description: Including pain rating scale, medication(s)/side effects and non-pharmacologic comfort measures Outcome: Progressing   Problem: Health Behavior/Discharge Planning: Goal: Ability to manage health-related needs will improve Outcome: Progressing   Problem: Education: Goal: Knowledge of General Education information will improve Description: Including pain rating scale, medication(s)/side effects and non-pharmacologic comfort measures Outcome: Progressing   Problem: Clinical Measurements: Goal: Ability to maintain clinical measurements within normal limits will improve Outcome: Progressing Goal: Will remain free from infection Outcome: Progressing Goal: Diagnostic test results will improve Outcome: Progressing Goal: Respiratory complications will improve Outcome: Progressing Goal: Cardiovascular complication will be avoided Outcome: Progressing   Problem: Activity: Goal: Risk for activity intolerance will  decrease Outcome: Progressing   Problem: Nutrition: Goal: Adequate nutrition will be maintained Outcome: Progressing   Problem: Coping: Goal: Level of anxiety will decrease Outcome: Progressing   Problem: Elimination: Goal: Will not experience complications related to bowel motility Outcome: Progressing Goal: Will not experience complications related to urinary retention Outcome: Progressing   Problem: Pain Managment: Goal: General experience of comfort will improve Outcome: Progressing   Problem: Safety: Goal: Ability to remain free from injury will improve Outcome: Progressing   Problem: Skin Integrity: Goal: Risk for impaired skin integrity will decrease Outcome: Progressing

## 2020-09-04 NOTE — Op Note (Signed)
OPERATIVE NOTE   PROCEDURE: 1. Left common femoral, superficial femoral and profunda femoris endarterectomy with Cormatrix patch angioplasty 2. Thrombectomy of the left superficial femoral artery with a #3 Fogarty balloon catheter. 3. Left lower extremity angiography 4. Placement of 2 Viabahn overlapping stents in the left superficial femoral artery and popliteal artery  PRE-OPERATIVE DIAGNOSIS: Atherosclerotic occlusive disease left lower extremity with lifestyle limiting claudication and mild rest pain symptoms; complication vascular device with acute thrombosis of previously placed SFA and popliteal stents  POST-OPERATIVE DIAGNOSIS: Same  CO-SURGEON: Katha Cabal, MD and Algernon Huxley, M.D.  ASSISTANT(S): None  ANESTHESIA: general  ESTIMATED BLOOD LOSS: 1000 cc  FINDING(S): 1. Profound calcific plaque noted of the left common femoral extending past the initial bifurcation of the profunda femoris arteries as well as down the extensive length of the SFA  SPECIMEN(S):  Calcific plaque from the common femoral, superficial femoral and the profunda femoris artery  INDICATIONS:   Carl Norris 63 y.o. y.o.male who presents with complaints of lifestyle limiting claudication and pain continuously in the left lower extremity. The patient has documented severe atherosclerotic occlusive disease and has undergone minimally invasive treatments in the past. However, at this point his primary area of stricture stenosis resides in the common femoral and origins of the superficial femoral and profunda femoris extending into these arteries and therefore this is not amenable to intervention. The patient is therefore undergoing open endarterectomy. The risks and benefits of surgery have been reviewed with the patient, all questions have answered; alternative therapies have been reviewed as well and the patient has agreed to proceed with surgical open  repair.  DESCRIPTION: After obtaining full informed written consent, the patient was brought back to the operating room and placed supine upon the operating table.  The patient received IV antibiotics prior to induction.  After obtaining adequate anesthesia, the patient was prepped and draped in the standard fashion for left femoral exposure.  Co-surgeons are required because this is a complicated procedure with work being performed simultaneously from both the patient's right left sides.  This also expedites the procedure making a shorter operative time reducing complications and improving patient safety.  Attention was turned to the left groin with Dr. Lucky Cowboy working on the patient's right and myself working on the left of the patient.  Vertical  Incision was made over the left common femoral artery and dissection carried down to the common femoral artery with electrocautery.  I dissected out the common femoral artery from the distal external iliac artery (identified by the superficial circumflex vessels) down to the femoral bifurcation.  On initial inspection, the common femoral artery was: densely calcified and there was no palpable pulse noted.    Subsequently the dissection was continued to include all circumflex branches and the profunda femoral artery and superficial femoral artery. The superficial femoral artery was dissected circumferentially for a distance of approximately 3-4 cm and the profunda femoris was dissected circumferentially out to the fourth order branches individual vessel loops were placed around each branch.  Control of all branches was obtained with vessel loops.  A softer area in the distal external iliac artery amendable to clamping was identified.    The patient was given 5000 units of Heparin intravenously, which was a therapeutic bolus.   After waiting 3 minutes, the distal external iliac artery was clamped and all of the vessel loops were placed under tension.  Arteriotomy was  made in the common femoral artery with a 11-blade and extended  it with a Potts scissor proximally and distally extending the distal end down the SFA for approximately 3 cm.   Endarterectomy was then performed under direct visualization using a freer elevator and a right angle from the mid common femoral extending up both proximally and distally. Proximally the endarterectomy was brought up to the level of the clamp where a clean edge was obtained. Distally the endarterectomy was carried down to a soft spot in the SFA where a feathered edge would was obtained.    The profunda femoris was treated with an eversion technique extending endarterectomy approximately 2 cm distally again obtaining a featheredge on the left.  #3 Fogarty balloon thromboembolectomy catheter was then passed down the left SFA.  Total of 3 passes were made with return of a large amount of thrombus however backbleeding was not encountered.  At this point, a corematrix patch was fashioned for the geometry of the arteriotomy.  The patch was sewn to the artery with 2 running stitches of 6-0 Prolene, running from each end.    A gap was left in the medial wall so that we could place the 8 [place] sheath.  J-wire was then advanced into the ostium of the previous replaced stent under direct visualization and subsequently the 8 French sheath was advanced over the wire.  A advantage wire and Kumpe catheter were then negotiated down to the level of the trifurcation under direct fluoroscopic guidance.  Hand-injection contrast demonstrated the trifurcation was patent with preservation of the tibial runoff.  The Kumpe catheter was then removed and an over-the-wire Fogarty was advanced down to the distal popliteal inflated with diluted contrast and pulled back.  A small residual piece of thrombus was retrieved.  Follow-up angiography demonstrated complete resolution of the thrombus in the 035 advantage wire was exchanged for a V 18 using a Kumpe  catheter.  We then deployed to 7 mm x 25 cm Viabahn stents overlapping the stents and extending from the level of the tibial plateau to the origin of the native SFA.  The stents were then postdilated using a 7 mm [place] balloon with inflations from 14 atm to 28 atm.  Follow-up angiography with hand-injection demonstrated the stents were fully expanded and were widely patent with preservation of the three-vessel runoff to the foot.  This point the wire and sheath were removed and we elected to complete the suture line and the patch.  Prior to completing the patch angioplasty, the profunda femoral artery was flushed as was the superficial femoral artery. The system was then forward flushed. The endarterectomy site was then irrigated copiously with heparinized saline.   The patch angioplasty was completed in the usual fashion.  Flow was then reestablished first to the profunda femoris and then the superficial femoral artery.  Bleeding was encountered from the posterior wall of the distal common femoral artery and this was repaired with multiple 5-0 and 6-0 Prolene's using pledgets made out of the CorMatrix patch.  Once hemostasis had been achieved the suture line of the patch was reevaluated and any gaps or bleeding sites in the suture line were easily controlled with a 6-0 Prolene suture. Doppler is then delivered onto the field and the SFA as well as the profunda femoris arteries were interrogated and found to have triphasic Doppler signals.  The left groin was then irrigated copiously with sterile saline and subsequently Evicel and Surgicel were placed in the wound. The incision was repaired with a double layer of 2-0 Vicryl, a double layer  of 3-0 Vicryl, and a layer of 4-0 Monocryl in a subcuticular fashion.  The skin was cleaned, dried, and reinforced with Dermabond.  COMPLICATIONS: None  CONDITION: Carl Norris, M.D. [facility] Office: [phone]  09/04/2020, 8:00  PM

## 2020-09-04 NOTE — Transfer of Care (Signed)
Immediate Anesthesia Transfer of Care Note  Patient: Carl Norris  Procedure(s) Performed: THROMBECTOMY FEMORAL ARTERY possible stent (Left )  Patient Location: PACU  Anesthesia Type:General  Level of Consciousness: drowsy and patient cooperative  Airway & Oxygen Therapy: Patient Spontanous Breathing and Patient connected to face mask oxygen  Post-op Assessment: Report given to RN and Post -op Vital signs reviewed and stable  Post vital signs: Reviewed and stable  Last Vitals:  Vitals Value Taken Time  BP 90/63 09/04/20 1945  Temp    Pulse 65 09/04/20 1946  Resp 15 09/04/20 1946  SpO2 100 % 09/04/20 1946  Vitals shown include unvalidated device data.  Last Pain:  Vitals:   09/04/20 1942  TempSrc:   PainSc: (P) Asleep         Complications: No complications documented.

## 2020-09-04 NOTE — H&P (Signed)
@LOGO @   MRN :  Carl Norris is a 64 y.o. (03-Aug-1956) male who presents with chief complaint of my left leg still hurts.  History of Present Illness:   The patient returns to the office for followup and review of the noninvasive studies. He continues to c/o severe disability because of his walking secondary to severe pain in the lower extremity symptoms. The patient notes profound shortening of their claudication distance. No new ulcers or wounds have occurred since the last visit.  He was seen this past Monday in the office sooner than expected for complaints of increased left leg pain.  Noninvasive studies demonstrated occlusion of the SFA as well as the stents that were placed last week.  He was taken to angiography the next day which was yesterday however interventional techniques were not successful in reestablishing flow and therefore he is being brought to the operating room for open repair  There have been no significant changes to the patient's overall health care.  The patient denies amaurosis fugax or recent TIA symptoms. There are no recent neurological changes noted. The patient denies history of DVT, PE or superficial thrombophlebitis. The patient denies recent episodes of angina or shortness of breath.   Previous duplex Wednesday of the lower extremity arterial system shows bilateral SFA occlusions with monophasic tibial signals.  Ultrasound of the aorta and iliac arteries does not identify a hemodynamically significant stenosis  Current Meds  Medication Sig  . aspirin EC 81 MG tablet Take 1 tablet (81 mg total) by mouth daily. Swallow whole.  [place] atorvastatin (LIPITOR) 10 MG tablet Take 1 tablet (10 mg total) by mouth daily.  . clopidogrel (PLAVIX) 75 MG tablet Take 1 tablet (75 mg total) by mouth daily.  . hydrochlorothiazide (HYDRODIURIL) 25 MG tablet Take 25 mg by mouth daily.   . metoprolol succinate (TOPROL-XL) 100 MG 24 hr tablet Take 100 mg by mouth daily.    . naproxen sodium (ALEVE) 220 MG tablet Take 440 mg by mouth daily.   . vitamin B-12 (CYANOCOBALAMIN) 1000 MCG tablet Take 1,000 mcg by mouth daily.    Past Medical History:  Diagnosis Date  . Hypertension   . Peripheral vascular disease [facility])     Past Surgical History:  Procedure Laterality Date  . LOWER EXTREMITY ANGIOGRAPHY Left 08/27/2020   Procedure: LOWER EXTREMITY ANGIOGRAPHY;  Surgeon: 10/27/2020, MD;  Location: [facility] INVASIVE CV LAB;  Service: Cardiovascular;  Laterality: Left;  . LOWER EXTREMITY ANGIOGRAPHY Left 09/03/2020   Procedure: LOWER EXTREMITY ANGIOGRAPHY;  Surgeon: 09/05/2020, MD;  Location: [facility] INVASIVE CV LAB;  Service: Cardiovascular;  Laterality: Left;  . NECK SURGERY      Social History Social History   Tobacco Use  . Smoking status: Current Every Day Smoker    Packs/day: 2.00    Years: 47.00    Pack years: 94.00    Types: Cigarettes  . Smokeless tobacco: Never Used  . Tobacco comment: SMOKED TODAY   Substance Use Topics  . Alcohol use: Yes    Comment: socially  . Drug use: Never    Family History Family History  Problem Relation Age of Onset  . Cancer Father   . Vascular Disease Sister     Allergies  Allergen Reactions  . Oxycodone-Acetaminophen Itching     REVIEW OF SYSTEMS (Negative unless checked)  Constitutional: [] Weight loss  [] Fever  [] Chills Cardiac: [] Chest pain   [] Chest pressure   [] Palpitations   [] Shortness of breath when laying  flat   [] Shortness of breath with exertion. Vascular:  [x] Pain in legs with walking   [] Pain in legs at rest  [] History of DVT   [] Phlebitis   [] Swelling in legs   [] Varicose veins   [] Non-healing ulcers Pulmonary:   [] Uses home oxygen   [] Productive cough   [] Hemoptysis   [] Wheeze  [] COPD   [] Asthma Neurologic:  [] Dizziness   [] Seizures   [] History of stroke   [] History of TIA  [] Aphasia   [] Vissual changes   [] Weakness or numbness in arm   [] Weakness or numbness in  leg Musculoskeletal:   [] Joint swelling   [x] Joint pain   [] Low back pain Hematologic:  [] Easy bruising  [] Easy bleeding   [] Hypercoagulable state   [] Anemic Gastrointestinal:  [] Diarrhea   [] Vomiting  [] Gastroesophageal reflux/heartburn   [] Difficulty swallowing. Genitourinary:  [] Chronic kidney disease   [] Difficult urination  [] Frequent urination   [] Blood in urine Skin:  [] Rashes   [] Ulcers  Psychological:  [] History of anxiety   []  History of major depression.  Physical Examination  Vitals:   09/04/20 0540 09/04/20 0830 09/04/20 1209 09/04/20 1513  BP: (!) 138/58 (!) 149/74 (!) 172/63 (!) 166/73  Pulse: (!) 59 63 61 69  Resp: 20 18 16 20   Temp: 98.1 F (36.7 C) 98.4 F (36.9 C) 98.5 F (36.9 C) 98.5 F (36.9 C)  TempSrc:   Oral Tympanic  SpO2: 98% 96% 97% 98%  Weight:      Height:       Body mass index is 30.46 kg/m. Gen: WD/WN, NAD Head: Ball/AT, No temporalis wasting.  Ear/Nose/Throat: Hearing grossly intact, nares w/o erythema or drainage Eyes: PER, EOMI, sclera nonicteric.  Neck: Supple, no large masses.   Pulmonary:  Good air movement, no audible wheezing bilaterally, no use of accessory muscles.  Cardiac: RRR, no JVD Vascular:  Vessel Right Left  Radial Palpable Palpable  PT Not Palpable Not Palpable  DP Not Palpable Not Palpable  Gastrointestinal: Non-distended. No guarding/no peritoneal signs.  Musculoskeletal: M/S 5/5 throughout.  No deformity or atrophy.  Neurologic: CN 2-12 intact. Symmetrical.  Speech is fluent. Motor exam as listed above. Psychiatric: Judgment intact, Mood & affect appropriate for pt's clinical situation. Dermatologic: No rashes or ulcers noted.  No changes consistent with cellulitis.   CBC Lab Results  Component Value Date   WBC 7.4 09/04/2020   HGB 12.9 (L) 09/04/2020   HCT 37.1 (L) 09/04/2020   MCV 97.1 09/04/2020   PLT 189 09/04/2020    BMET    Component Value Date/Time   NA 141 03/16/2014 1656   K 4.0 03/16/2014 1656    CL 106 03/16/2014 1656   CO2 29 03/16/2014 1656   GLUCOSE 79 03/16/2014 1656   BUN 20 09/03/2020 1434   BUN 16 03/16/2014 1656   CREATININE 1.27 (H) 09/03/2020 1434   CREATININE 0.97 03/16/2014 1656   CALCIUM 8.9 03/16/2014 1656   GFRNONAA 60 (L) 09/03/2020 1434   GFRNONAA >60 03/16/2014 1656   GFRAA >60 03/16/2014 1656   Estimated Creatinine Clearance: 69.3 mL/min (A) (by C-G formula based on SCr of 1.27 mg/dL (H)).  COAG No results found for: INR, PROTIME  Radiology PERIPHERAL VASCULAR CATHETERIZATION  Result Date: 09/03/2020 See op note  PERIPHERAL VASCULAR CATHETERIZATION  Result Date: 08/27/2020 See op note  VAS ABI WITH/WO TBI  Result Date: 09/02/2020 LOWER EXTREMITY DOPPLER STUDY Indications: Rest pain.  Vascular Interventions: 08/27/2020: Crosser Atherectomy of the Left SFA and  Popliteal Artery. PTA and Stent placement of the Left                         SFA and Popliteal Artery. PTA of the Profunda Femoral                         Artery. Performing Technologist: Debbe BalesSolomon Mcclary RVS  Examination Guidelines: A complete evaluation includes at minimum, Doppler waveform signals and systolic blood pressure reading at the level of bilateral brachial, anterior tibial, and posterior tibial arteries, when vessel segments are accessible. Bilateral testing is considered an integral part of a complete examination. Photoelectric Plethysmograph (PPG) waveforms and toe systolic pressure readings are included as required and additional duplex testing as needed. Limited examinations for reoccurring indications may be performed as noted.  ABI Findings: +---------+------------------+-----+--------+--------+ Right    Rt Pressure (mmHg)IndexWaveformComment  +---------+------------------+-----+--------+--------+ Brachial 152                                     +---------+------------------+-----+--------+--------+ ATA      107               0.70                   +---------+------------------+-----+--------+--------+ PTA      101               0.66                  +---------+------------------+-----+--------+--------+ Great Toe59                0.39                  +---------+------------------+-----+--------+--------+ +---------+------------------+-----+--------+-------+ Left     Lt Pressure (mmHg)IndexWaveformComment +---------+------------------+-----+--------+-------+ Brachial 152                                    +---------+------------------+-----+--------+-------+ ATA      40                0.26                 +---------+------------------+-----+--------+-------+ PTA      67                0.44                 +---------+------------------+-----+--------+-------+ Great Toe0                 0.00                 +---------+------------------+-----+--------+-------+ +-------+-----------+-----------+------------+------------+ ABI/TBIToday's ABIToday's TBIPrevious ABIPrevious TBI +-------+-----------+-----------+------------+------------+ Right  .70        .39                                 +-------+-----------+-----------+------------+------------+ Left   .44        0.0                                 +-------+-----------+-----------+------------+------------+  Summary: Right: Resting right ankle-brachial index indicates moderate right lower extremity arterial disease. The right toe-brachial index is abnormal. Imaged and obtained Waveforms at  the level of the Ankle. Left: Resting left ankle-brachial index indicates severe left lower extremity arterial disease. The left toe-brachial index is abnormal. Imaged and obtained Waveforms at the level of the Ankle. Imaged the CFA,SFA and Popliteal Artery as Well, No flow seen in the SFA and Popliteal Artery.  *See table(s) above for measurements and observations.  Electronically signed by Levora Dredge MD on 09/02/2020 at 5:08:33 PM.    Final       Assessment/Plan 1. Atherosclerosis of native artery of both lower extremities with intermittent claudication (HCC) Recommend:  The patient has experienced increased symptoms and is now describing bilateral lifestyle limiting claudication and mild rest pain.  He notes his left leg is worse than his right.  Noninvasive studies as well as physical examination show the recently placed stents of thrombosed.  Given his rest pain symptoms and the lack of success achieved in angiography we will move forward with open surgical repair.  Plan is for left groin exploration with thrombectomy of the left SFA and popliteal.  Probable endarterectomy of the proximal SFA in association with re-stenting.  2. Essential (primary) hypertension Continue antihypertensive medications as already ordered, these medications have been reviewed and there are no changes at this time.   3. Type 2 diabetes mellitus with hyperglycemia, without long-term current use of insulin (HCC) Continue hypoglycemic medications as already ordered, these medications have been reviewed and there are no changes at this time.  Hgb A1C to be monitored as already arranged by primary service  Levora Dredge, MD  09/04/2020 3:58 PM

## 2020-09-05 ENCOUNTER — Encounter: Payer: Self-pay | Admitting: Vascular Surgery

## 2020-09-05 LAB — CBC
HCT: 31.9 % — ABNORMAL LOW (ref 39.0–52.0)
Hemoglobin: 11.1 g/dL — ABNORMAL LOW (ref 13.0–17.0)
MCH: 33.8 pg (ref 26.0–34.0)
MCHC: 34.8 g/dL (ref 30.0–36.0)
MCV: 97.3 fL (ref 80.0–100.0)
Platelets: 171 10*3/uL (ref 150–400)
RBC: 3.28 MIL/uL — ABNORMAL LOW (ref 4.22–5.81)
RDW: 12.5 % (ref 11.5–15.5)
WBC: 10.9 10*3/uL — ABNORMAL HIGH (ref 4.0–10.5)
nRBC: 0 % (ref 0.0–0.2)

## 2020-09-05 LAB — BASIC METABOLIC PANEL
Anion gap: 9 (ref 5–15)
BUN: 20 mg/dL (ref 8–23)
CO2: 29 mmol/L (ref 22–32)
Calcium: 7.8 mg/dL — ABNORMAL LOW (ref 8.9–10.3)
Chloride: 100 mmol/L (ref 98–111)
Creatinine, Ser: 1.22 mg/dL (ref 0.61–1.24)
GFR, Estimated: >60 mL/min (ref >60)
Glucose, Bld: 285 mg/dL — ABNORMAL HIGH (ref 70–99)
Potassium: 4.2 mmol/L (ref 3.5–5.1)
Sodium: 138 mmol/L (ref 135–145)

## 2020-09-05 MED ORDER — NICOTINE 21 MG/24HR TD PT24
21.0000 mg | MEDICATED_PATCH | Freq: Every day | TRANSDERMAL | Status: DC
  Administered 2020-09-05 – 2020-09-10 (×6): 21 mg via TRANSDERMAL
  Filled 2020-09-05 (×6): qty 1

## 2020-09-05 NOTE — Progress Notes (Signed)
Nutrition Brief Note  Patient identified on the Malnutrition Screening Tool (MST) Report  64 y/o male with h/o DM and HTN who is s/p left femoral endarterectomy with stenting of the SFA popliteal 10/20  Met with pt in room today. Pt reports good appetite and oral intake pta and in hospital. Pt currently eating 100% of meals in hospital.   Wt Readings from Last 15 Encounters:  09/03/20 96.3 kg  09/02/20 96.3 kg  08/27/20 101.2 kg  08/01/20 99.2 kg  07/15/20 97.5 kg    Body mass index is 30.46 kg/m. Patient meets criteria for overweight based on current BMI.   Current diet order is Heart Healthy, patient is consuming approximately 100% of meals at this time. Labs and medications reviewed.   No nutrition interventions warranted at this time. If nutrition issues arise, please consult RD.   Carl Distance MS, RD, LDN Please refer to [facility] for RD and/or RD on-call/weekend/after hours pager

## 2020-09-05 NOTE — Progress Notes (Signed)
[facility]  Daily Progress Note   Subjective  - 1 Day Post-Op  Patient is having moderate to severe pain in the right thigh.  He does state that his foot now feels warm to him and is no longer numb.  Objective Vitals:   09/05/20 0614 09/05/20 0840 09/05/20 0900 09/05/20 1204  BP:  120/82  (!) 115/55  Pulse: 70 70  74  Resp:  20  18  Temp:  98.2 F (36.8 C)  98.3 F (36.8 C)  TempSrc:  Oral    SpO2: 96% 98% 97% 100%  Weight:      Height:        Intake/Output Summary (Last 24 hours) at 09/05/2020 1328 Last data filed at 09/05/2020 1202 Gross per 24 hour  Intake 3100.5 ml  Output 200 ml  Net 2900.5 ml    PULM  Normal effort , no use of accessory muscles CV  No JVD, RRR Abd      No distended, nontender VASC  left groin is clean dry and intact.  There is a 2 out of 2 plus posterior tibial pulse.  The left foot is warm and hyperemic  Laboratory CBC    Component Value Date/Time   WBC 10.9 (H) 09/05/2020 0609   HGB 11.1 (L) 09/05/2020 0609   HGB 16.0 03/16/2014 1656   HCT 31.9 (L) 09/05/2020 0609   HCT 47.3 03/16/2014 1656   PLT 171 09/05/2020 0609   PLT 200 03/16/2014 1656    BMET    Component Value Date/Time   NA 138 09/05/2020 0609   NA 141 03/16/2014 1656   K 4.2 09/05/2020 0609   K 4.0 03/16/2014 1656   CL 100 09/05/2020 0609   CL 106 03/16/2014 1656   CO2 29 09/05/2020 0609   CO2 29 03/16/2014 1656   GLUCOSE 285 (H) 09/05/2020 0609   GLUCOSE 79 03/16/2014 1656   BUN 20 09/05/2020 0609   BUN 16 03/16/2014 1656   CREATININE 1.22 09/05/2020 0609   CREATININE 0.97 03/16/2014 1656   CALCIUM 7.8 (L) 09/05/2020 0609   CALCIUM 8.9 03/16/2014 1656   GFRNONAA >60 09/05/2020 0609   GFRNONAA >60 03/16/2014 1656   GFRAA >60 03/16/2014 1656    Assessment/Planning: POD #1 s/p left femoral endarterectomy with stenting of the SFA popliteal   We will continue to observe 1 more day given the relatively poor pain control.  My plan would  be the patient will go home tomorrow on postoperative day #2  Levora Dredge  09/05/2020, 1:28 PM

## 2020-09-06 ENCOUNTER — Inpatient Hospital Stay: Payer: 59

## 2020-09-06 LAB — SURGICAL PATHOLOGY

## 2020-09-06 NOTE — Progress Notes (Signed)
[facility]  Daily Progress Note   Subjective  -   Had significant fever this morning.  Last 2 checks the temperature has been normal. Still with groin pain.   Objective Vitals:   09/06/20 1020 09/06/20 1102 09/06/20 1226 09/06/20 1451  BP: 113/61 (!) 129/53  116/60  Pulse: 72 72  71  Resp:  19  15  Temp: 99.8 F (37.7 C) (!) 102.9 F (39.4 C) 99.5 F (37.5 C) 99.3 F (37.4 C)  TempSrc: Oral Oral  Oral  SpO2: 100% 100%  97%  Weight:      Height:        Intake/Output Summary (Last 24 hours) at 09/06/2020 1517 Last data filed at 09/06/2020 1424 Gross per 24 hour  Intake 360 ml  Output 815 ml  Net -455 ml    PULM  CTAB CV  RRR VASC  2+ PT pulse, 1+ AT pulse, 1+ LE edema.  Incision in clean  Laboratory CBC    Component Value Date/Time   WBC 10.9 (H) 09/05/2020 0609   HGB 11.1 (L) 09/05/2020 0609   HGB 16.0 03/16/2014 1656   HCT 31.9 (L) 09/05/2020 0609   HCT 47.3 03/16/2014 1656   PLT 171 09/05/2020 0609   PLT 200 03/16/2014 1656    BMET    Component Value Date/Time   NA 138 09/05/2020 0609   NA 141 03/16/2014 1656   K 4.2 09/05/2020 0609   K 4.0 03/16/2014 1656   CL 100 09/05/2020 0609   CL 106 03/16/2014 1656   CO2 29 09/05/2020 0609   CO2 29 03/16/2014 1656   GLUCOSE 285 (H) 09/05/2020 0609   GLUCOSE 79 03/16/2014 1656   BUN 20 09/05/2020 0609   BUN 16 03/16/2014 1656   CREATININE 1.22 09/05/2020 0609   CREATININE 0.97 03/16/2014 1656   CALCIUM 7.8 (L) 09/05/2020 0609   CALCIUM 8.9 03/16/2014 1656   GFRNONAA >60 09/05/2020 0609   GFRNONAA >60 03/16/2014 1656   GFRAA >60 03/16/2014 1656    Assessment/Planning: POD #2 s/p femoral endarterectomy with Fogarty embolectomy and stent placement to the SFA and popliteal arteries   Overall doing fairly well, but can discharge home today since he had a fever early this morning.  If he has a recurrent fever, would plan chest x-ray, urine culture, and blood culture.  Promote  ambulation and activity.  Home tomorrow if no further fever    Festus Barren  09/06/2020, 3:17 PM

## 2020-09-06 NOTE — Progress Notes (Signed)
   09/06/20 1102  Assess: MEWS Score  Temp (!) 102.9 F (39.4 C) (RN Kazuo Durnil notified)  BP (!) 129/53  Pulse Rate 72  Resp 19  SpO2 100 %  O2 Device Room Air  Assess: MEWS Score  MEWS Temp 2  MEWS Systolic 0  MEWS Pulse 0  MEWS RR 0  MEWS LOC 0  MEWS Score 2  MEWS Score Color Yellow  Assess: if the MEWS score is Yellow or Red  Were vital signs taken at a resting state? Yes  Focused Assessment No change from prior assessment  Early Detection of Sepsis Score *See Row Information* Medium  MEWS guidelines implemented *See Row Information* Yes  Take Vital Signs  Increase Vital Sign Frequency  Yellow: Q 2hr X 2 then Q 4hr X 2, if remains yellow, continue Q 4hrs  Escalate  MEWS: Escalate Yellow: discuss with charge nurse/RN and consider discussing with provider and RRT  Notify: Charge Nurse/RN  Name of Charge Nurse/RN Notified Matheson Vandehei, rn  Date Charge Nurse/RN Notified 09/06/20  Time Charge Nurse/RN Notified 1102

## 2020-09-07 LAB — CBC WITH DIFFERENTIAL/PLATELET
Abs Immature Granulocytes: 0.16 10*3/uL — ABNORMAL HIGH (ref 0.00–0.07)
Basophils Absolute: 0.1 10*3/uL (ref 0.0–0.1)
Basophils Relative: 1 %
Eosinophils Absolute: 0.1 10*3/uL (ref 0.0–0.5)
Eosinophils Relative: 1 %
HCT: 29.7 % — ABNORMAL LOW (ref 39.0–52.0)
Hemoglobin: 10.3 g/dL — ABNORMAL LOW (ref 13.0–17.0)
Immature Granulocytes: 1 %
Lymphocytes Relative: 15 %
Lymphs Abs: 1.8 10*3/uL (ref 0.7–4.0)
MCH: 33.2 pg (ref 26.0–34.0)
MCHC: 34.7 g/dL (ref 30.0–36.0)
MCV: 95.8 fL (ref 80.0–100.0)
Monocytes Absolute: 1.6 10*3/uL — ABNORMAL HIGH (ref 0.1–1.0)
Monocytes Relative: 13 %
Neutro Abs: 8.6 10*3/uL — ABNORMAL HIGH (ref 1.7–7.7)
Neutrophils Relative %: 69 %
Platelets: 212 10*3/uL (ref 150–400)
RBC: 3.1 MIL/uL — ABNORMAL LOW (ref 4.22–5.81)
RDW: 12.4 % (ref 11.5–15.5)
WBC: 12.2 10*3/uL — ABNORMAL HIGH (ref 4.0–10.5)
nRBC: 0 % (ref 0.0–0.2)

## 2020-09-07 LAB — URINALYSIS, ROUTINE W REFLEX MICROSCOPIC
Bilirubin Urine: NEGATIVE
Glucose, UA: 50 mg/dL — AB
Hgb urine dipstick: NEGATIVE
Ketones, ur: NEGATIVE mg/dL
Leukocytes,Ua: NEGATIVE
Nitrite: NEGATIVE
Protein, ur: NEGATIVE mg/dL
Specific Gravity, Urine: 1.013 (ref 1.005–1.030)
pH: 7 (ref 5.0–8.0)

## 2020-09-07 MED ORDER — IBUPROFEN 400 MG PO TABS: 400.0000 mg | ORAL_TABLET | Freq: Four times a day (QID) | ORAL | Status: DC | PRN

## 2020-09-07 MED ORDER — VANCOMYCIN HCL 2000 MG/400ML IV SOLN
2000.0000 mg | Freq: Once | INTRAVENOUS | Status: AC
  Administered 2020-09-07: 2000 mg via INTRAVENOUS
  Filled 2020-09-07: qty 400

## 2020-09-07 MED ORDER — SODIUM CHLORIDE 0.9 % IV SOLN
2.0000 g | Freq: Three times a day (TID) | INTRAVENOUS | Status: DC
  Administered 2020-09-07 – 2020-09-10 (×8): 2 g via INTRAVENOUS
  Filled 2020-09-07 (×11): qty 2

## 2020-09-07 MED ORDER — ACETAMINOPHEN 325 MG PO TABS
650.0000 mg | ORAL_TABLET | Freq: Four times a day (QID) | ORAL | Status: DC | PRN
  Administered 2020-09-07 – 2020-09-08 (×3): 650 mg via ORAL
  Filled 2020-09-07 (×3): qty 2

## 2020-09-07 MED ORDER — VANCOMYCIN HCL 1250 MG/250ML IV SOLN
1250.0000 mg | Freq: Two times a day (BID) | INTRAVENOUS | Status: DC
  Administered 2020-09-08: 1250 mg via INTRAVENOUS
  Filled 2020-09-07 (×3): qty 250

## 2020-09-07 MED ORDER — SODIUM CHLORIDE 0.9 % IV BOLUS
500.0000 mL | Freq: Once | INTRAVENOUS | Status: AC
  Administered 2020-09-07: 500 mL via INTRAVENOUS

## 2020-09-07 NOTE — Progress Notes (Signed)
3 Days Post-Op   Subjective/Chief Complaint: Notes mild pain in LEFT groin. Controlled with current regimen. Remains febrile; mild tachycardia.  Denies dysuria, cough or abdominal pain   Objective: Vital signs in last 24 hours: Temp:  [99.3 F (37.4 C)-103.1 F (39.5 C)] 102.5 F (39.2 C) (10/23 0814) Pulse Rate:  [71-101] 101 (10/23 0814) Resp:  [15-20] 20 (10/23 0814) BP: (112-150)/(50-80) 115/59 (10/23 0814) SpO2:  [93 %-100 %] 95 % (10/23 0814) Last BM Date: 09/03/20  Intake/Output from previous day: 10/22 0701 - 10/23 0700 In: 480 [P.O.:480] Out: 850 [Urine:850] Intake/Output this shift: Total I/O In: -  Out: 400 [Urine:400]  General appearance: alert and no distress Resp: clear to auscultation bilaterally Cardio: regular rate and rhythm GI: soft, non-tender; bowel sounds normal; no masses,  no organomegaly Extremities: 2+ edema; Incision- C/D/I, soft, +PT, +AT, foot warm  Lab Results:  Recent Labs    09/04/20 2049 09/05/20 0609  WBC 10.5 10.9*  HGB 11.8* 11.1*  HCT 34.4* 31.9*  PLT 179 171   BMET Recent Labs    09/05/20 0609  NA 138  K 4.2  CL 100  CO2 29  GLUCOSE 285*  BUN 20  CREATININE 1.22  CALCIUM 7.8*   PT/INR No results for input(s): LABPROT, INR in the last 72 hours. ABG No results for input(s): PHART, HCO3 in the last 72 hours.  Invalid input(s): PCO2, PO2  Studies/Results: DG Chest 1 View  Result Date: 09/06/2020 CLINICAL DATA:  Cough EXAM: CHEST  1 VIEW COMPARISON:  None. FINDINGS: There is mild elevation of the right hemidiaphragm and associated right basilar atelectasis. The lungs are otherwise clear. No pneumothorax or pleural effusion. Cardiac size within normal limits. No acute bone abnormality. IMPRESSION: No active disease. Electronically Signed   By: Helyn Numbers MD   On: 09/06/2020 17:48    Anti-infectives: Anti-infectives (From admission, onward)   Start     Dose/Rate Route Frequency Ordered Stop   09/05/20 0000   ceFAZolin (ANCEF) IVPB 1 g/50 mL premix        1 g 100 mL/hr over 30 Minutes Intravenous Every 8 hours 09/04/20 2109 09/05/20 1653   09/04/20 1615  ceFAZolin (ANCEF) IVPB 2g/100 mL premix        2 g 200 mL/hr over 30 Minutes Intravenous On call to O.R. 09/04/20 1606 09/04/20 1649   09/04/20 1600  ceFAZolin (ANCEF) IVPB 2g/100 mL premix  Status:  Discontinued        2 g 200 mL/hr over 30 Minutes Intravenous  Once 09/04/20 1556 09/04/20 1607   09/04/20 1553  ceFAZolin (ANCEF) 2-4 GM/100ML-% IVPB       Note to Pharmacy: Marylee Floras   : cabinet override      09/04/20 1553 09/04/20 1707   09/03/20 1400  ceFAZolin (ANCEF) IVPB 2g/100 mL premix        2 g 200 mL/hr over 30 Minutes Intravenous  Once 09/03/20 1358 09/03/20 1535      Assessment/Plan:  POD #3 s/p femoral endarterectomy with Fogarty embolectomy and stent placement to the SFA and popliteal arteries  Fever of Unknown Origin. Bld Cultures- Pending Will obtain UCX NS bolus OOB Consult ID to assist in determining etiology and directing ABX tx.  LOS: 4 days    Bertram Denver 09/07/2020

## 2020-09-07 NOTE — Progress Notes (Signed)
Pt.has been febrile most of the day. Relieved with tylenol. MD aware. Patient has no signs of acute distress

## 2020-09-07 NOTE — Consult Note (Addendum)
Pharmacy Antibiotic Note  Carl Norris is a 64 y.o. male admitted on 09/03/2020 with fever s/p left femoral endarterectomy with stenting of the SFA popliteal. Pt underwent procedure on 10/19. PMH includes HTN, type 2 diabetes, smoking, and PVD. Pharmacy has been consulted for vancomycin and cefepime dosing.  Plan: - Vancomycin 2000 mg IV x 1, followed by 1250 mg IV every 12 hours.  Goal trough 15-20 mcg/mL. - Cefepime 2 g IV q8h - Monitor renal function  Height: 5\' 10"  (177.8 cm) Weight: 96.3 kg (212 lb 4.9 oz) IBW/kg (Calculated) : 73  Temp (24hrs), Avg:100.7 F (38.2 C), Min:99.5 F (37.5 C), Max:102.5 F (39.2 C)  Recent Labs  Lab 09/03/20 1434 09/04/20 0023 09/04/20 2049 09/05/20 0609 09/07/20 1314  WBC  --  7.4 10.5 10.9* 12.2*  CREATININE 1.27*  --   --  1.22  --     Estimated Creatinine Clearance: 72.1 mL/min (by C-G formula based on SCr of 1.22 mg/dL).    Allergies  Allergen Reactions  . Oxycodone-Acetaminophen Itching    Antimicrobials this admission: 10/19 cefazolin >> 10/21 10/23 cefepime >> 10/23 vancomycin >>  Microbiology results: 10/22 BCx: NGTD 10/20 MRSA PCR: negative  Thank you for allowing pharmacy to be a part of this patient's care.  11/20, PharmD Pharmacy Resident  09/07/2020 5:09 PM

## 2020-09-08 LAB — BASIC METABOLIC PANEL
Anion gap: 6 (ref 5–15)
BUN: 11 mg/dL (ref 8–23)
CO2: 28 mmol/L (ref 22–32)
Calcium: 7.5 mg/dL — ABNORMAL LOW (ref 8.9–10.3)
Chloride: 103 mmol/L (ref 98–111)
Creatinine, Ser: 0.86 mg/dL (ref 0.61–1.24)
GFR, Estimated: >60 mL/min (ref >60)
Glucose, Bld: 121 mg/dL — ABNORMAL HIGH (ref 70–99)
Potassium: 3.7 mmol/L (ref 3.5–5.1)
Sodium: 137 mmol/L (ref 135–145)

## 2020-09-08 LAB — SEDIMENTATION RATE: Sed Rate: 126 mm/hr — ABNORMAL HIGH (ref 0–20)

## 2020-09-08 MED ORDER — VANCOMYCIN HCL 1500 MG/300ML IV SOLN
1500.0000 mg | Freq: Two times a day (BID) | INTRAVENOUS | Status: DC
  Administered 2020-09-08 – 2020-09-09 (×2): 1500 mg via INTRAVENOUS
  Filled 2020-09-08 (×4): qty 300

## 2020-09-08 MED ORDER — POTASSIUM CHLORIDE IN NACL 20-0.9 MEQ/L-% IV SOLN
INTRAVENOUS | Status: DC
  Filled 2020-09-08 (×4): qty 1000

## 2020-09-08 NOTE — Consult Note (Signed)
Pharmacy Antibiotic Note  Carl Norris is a 64 y.o. male admitted on 09/03/2020 with fever s/p left femoral endarterectomy with stenting of the SFA popliteal. Pt underwent procedure on 10/19. PMH includes HTN, type 2 diabetes, smoking, and PVD. Pharmacy has been consulted for vancomycin and cefepime dosing.  Plan: - Vancomycin 2000 mg IV x 1, followed by 1250 mg IV every 12 hours.  Goal trough 15-20 mcg/mL. Will change to 1500 mg q12H (~15 mg/kg). Plan to get vancomycin level in the next 2-3 days.  - Cefepime 2 g IV q8h - Monitor renal function  Height: 5\' 10"  (177.8 cm) Weight: 96.3 kg (212 lb 4.9 oz) IBW/kg (Calculated) : 73  Temp (24hrs), Avg:99.7 F (37.6 C), Min:98.3 F (36.8 C), Max:101 F (38.3 C)  Recent Labs  Lab 09/03/20 1434 09/04/20 0023 09/04/20 2049 09/05/20 0609 09/07/20 1314 09/08/20 0419  WBC  --  7.4 10.5 10.9* 12.2*  --   CREATININE 1.27*  --   --  1.22  --  0.86    Estimated Creatinine Clearance: 102.3 mL/min (by C-G formula based on SCr of 0.86 mg/dL).    Allergies  Allergen Reactions   Oxycodone-Acetaminophen Itching    Antimicrobials this admission: 10/19 cefazolin >> 10/21 10/23 cefepime >> 10/23 vancomycin >>  Microbiology results: 10/22 BCx: NGTD 10/20 MRSA PCR: negative  Thank you for allowing pharmacy to be a part of this patients care.  11/20, PharmD 09/08/2020 11:22 AM

## 2020-09-08 NOTE — Progress Notes (Signed)
4 Days Post-Op   Subjective/Chief Complaint: Patient febrile most of the day yesterday. Complains of nausea and generalized malaise. WBC increasing. Complains of groin/incisional discomfort. Controlled with current regimen.    Objective: Vital signs in last 24 hours: Temp:  [98.8 F (37.1 C)-101 F (38.3 C)] 100.4 F (38 C) (10/24 0812) Pulse Rate:  [71-80] 72 (10/24 0812) Resp:  [18-21] 21 (10/24 0812) BP: (113-164)/(49-102) 147/82 (10/24 0812) SpO2:  [98 %-100 %] 98 % (10/24 0812) Last BM Date: 09/03/20  Intake/Output from previous day: 10/23 0701 - 10/24 0700 In: 360 [P.O.:360] Out: 1250 [Urine:1250] Intake/Output this shift: No intake/output data recorded.  General appearance: mild distress Cardio: regular rate and rhythm Extremities: LEFT groin incision- C/D/I, soft, no erythema or drainage. Leg- warm, soft, no evidence of compartment syndrome, +DP/PT  Lab Results:  Recent Labs    09/07/20 1314  WBC 12.2*  HGB 10.3*  HCT 29.7*  PLT 212   BMET Recent Labs    09/08/20 0419  NA 137  K 3.7  CL 103  CO2 28  GLUCOSE 121*  BUN 11  CREATININE 0.86  CALCIUM 7.5*   PT/INR No results for input(s): LABPROT, INR in the last 72 hours. ABG No results for input(s): PHART, HCO3 in the last 72 hours.  Invalid input(s): PCO2, PO2  Studies/Results: DG Chest 1 View  Result Date: 09/06/2020 CLINICAL DATA:  Cough EXAM: CHEST  1 VIEW COMPARISON:  None. FINDINGS: There is mild elevation of the right hemidiaphragm and associated right basilar atelectasis. The lungs are otherwise clear. No pneumothorax or pleural effusion. Cardiac size within normal limits. No acute bone abnormality. IMPRESSION: No active disease. Electronically Signed   By: Helyn Numbers MD   On: 09/06/2020 17:48    Anti-infectives: Anti-infectives (From admission, onward)   Start     Dose/Rate Route Frequency Ordered Stop   09/08/20 0600  vancomycin (VANCOREADY) IVPB 1250 mg/250 mL        1,250  mg 166.7 mL/hr over 90 Minutes Intravenous Every 12 hours 09/07/20 1704     09/07/20 1830  vancomycin (VANCOREADY) IVPB 2000 mg/400 mL        2,000 mg 200 mL/hr over 120 Minutes Intravenous  Once 09/07/20 1704 09/07/20 2111   09/07/20 1800  ceFEPIme (MAXIPIME) 2 g in sodium chloride 0.9 % 100 mL IVPB        2 g 200 mL/hr over 30 Minutes Intravenous Every 8 hours 09/07/20 1704     09/05/20 0000  ceFAZolin (ANCEF) IVPB 1 g/50 mL premix        1 g 100 mL/hr over 30 Minutes Intravenous Every 8 hours 09/04/20 2109 09/05/20 1653   09/04/20 1615  ceFAZolin (ANCEF) IVPB 2g/100 mL premix        2 g 200 mL/hr over 30 Minutes Intravenous On call to O.R. 09/04/20 1606 09/04/20 1649   09/04/20 1600  ceFAZolin (ANCEF) IVPB 2g/100 mL premix  Status:  Discontinued        2 g 200 mL/hr over 30 Minutes Intravenous  Once 09/04/20 1556 09/04/20 1607   09/04/20 1553  ceFAZolin (ANCEF) 2-4 GM/100ML-% IVPB       Note to Pharmacy: Marylee Floras   : cabinet override      09/04/20 1553 09/04/20 1707   09/03/20 1400  ceFAZolin (ANCEF) IVPB 2g/100 mL premix        2 g 200 mL/hr over 30 Minutes Intravenous  Once 09/03/20 1358 09/03/20 1535  Assessment/Plan: s/p Procedure(s): THROMBECTOMY FEMORAL ARTERY possible stent (Left) Continued Fever of Unknown Origin.   Cultures still No growth to date. ID input appreciated. Continue Vanco/Cefepime for now. IVF and Zofran as needed for nausea. Encouraged OOB ambulate as tolerated. May consider CT scan of leg next week if no other source and no improvement.  LOS: 5 days    Carl Norris 09/08/2020

## 2020-09-09 DIAGNOSIS — E119 Type 2 diabetes mellitus without complications: Secondary | ICD-10-CM | POA: Diagnosis not present

## 2020-09-09 DIAGNOSIS — I10 Essential (primary) hypertension: Secondary | ICD-10-CM | POA: Diagnosis not present

## 2020-09-09 DIAGNOSIS — R5082 Postprocedural fever: Secondary | ICD-10-CM

## 2020-09-09 LAB — CBC WITH DIFFERENTIAL/PLATELET
Abs Immature Granulocytes: 0.2 10*3/uL — ABNORMAL HIGH (ref 0.00–0.07)
Basophils Absolute: 0.1 10*3/uL (ref 0.0–0.1)
Basophils Relative: 1 %
Eosinophils Absolute: 0.2 10*3/uL (ref 0.0–0.5)
Eosinophils Relative: 3 %
HCT: 27.9 % — ABNORMAL LOW (ref 39.0–52.0)
Hemoglobin: 9.5 g/dL — ABNORMAL LOW (ref 13.0–17.0)
Immature Granulocytes: 2 %
Lymphocytes Relative: 14 %
Lymphs Abs: 1.2 10*3/uL (ref 0.7–4.0)
MCH: 33.1 pg (ref 26.0–34.0)
MCHC: 34.1 g/dL (ref 30.0–36.0)
MCV: 97.2 fL (ref 80.0–100.0)
Monocytes Absolute: 1.2 10*3/uL — ABNORMAL HIGH (ref 0.1–1.0)
Monocytes Relative: 13 %
Neutro Abs: 6 10*3/uL (ref 1.7–7.7)
Neutrophils Relative %: 67 %
Platelets: 259 10*3/uL (ref 150–400)
RBC: 2.87 MIL/uL — ABNORMAL LOW (ref 4.22–5.81)
RDW: 12.3 % (ref 11.5–15.5)
WBC: 8.9 10*3/uL (ref 4.0–10.5)
nRBC: 0 % (ref 0.0–0.2)

## 2020-09-09 LAB — COMPREHENSIVE METABOLIC PANEL
ALT: 16 U/L (ref 0–44)
AST: 19 U/L (ref 15–41)
Albumin: 2.4 g/dL — ABNORMAL LOW (ref 3.5–5.0)
Alkaline Phosphatase: 32 U/L — ABNORMAL LOW (ref 38–126)
Anion gap: 6 (ref 5–15)
BUN: 11 mg/dL (ref 8–23)
CO2: 23 mmol/L (ref 22–32)
Calcium: 7.5 mg/dL — ABNORMAL LOW (ref 8.9–10.3)
Chloride: 106 mmol/L (ref 98–111)
Creatinine, Ser: 0.93 mg/dL (ref 0.61–1.24)
GFR, Estimated: >60 mL/min (ref >60)
Glucose, Bld: 135 mg/dL — ABNORMAL HIGH (ref 70–99)
Potassium: 4.4 mmol/L (ref 3.5–5.1)
Sodium: 135 mmol/L (ref 135–145)
Total Bilirubin: 0.6 mg/dL (ref 0.3–1.2)
Total Protein: 5.8 g/dL — ABNORMAL LOW (ref 6.5–8.1)

## 2020-09-09 LAB — SAR COV2 SEROLOGY (COVID19)AB(IGG),IA: SARS-CoV-2 Ab, IgG: NONREACTIVE

## 2020-09-09 MED ORDER — HYDROCHLOROTHIAZIDE 25 MG PO TABS
25.0000 mg | ORAL_TABLET | Freq: Every day | ORAL | Status: DC
  Administered 2020-09-09: 25 mg via ORAL
  Filled 2020-09-09 (×2): qty 1

## 2020-09-09 MED ORDER — VITAMIN B-12 1000 MCG PO TABS
1000.0000 ug | ORAL_TABLET | Freq: Every day | ORAL | Status: DC
  Administered 2020-09-09 – 2020-09-10 (×2): 1000 ug via ORAL
  Filled 2020-09-09 (×2): qty 1

## 2020-09-09 MED ORDER — VANCOMYCIN HCL IN DEXTROSE 1-5 GM/200ML-% IV SOLN
1000.0000 mg | Freq: Two times a day (BID) | INTRAVENOUS | Status: DC
  Administered 2020-09-09 – 2020-09-10 (×2): 1000 mg via INTRAVENOUS
  Filled 2020-09-09 (×4): qty 200

## 2020-09-09 NOTE — Evaluation (Signed)
Occupational Therapy Evaluation Patient Details Name: Carl Norris MRN: [id number] DOB: March 01, 1956 Today's Date: 09/09/2020    History of Present Illness 64 y.o. male admitted on 09/03/2020 with fever s/p left femoral endarterectomy with stenting of the SFA popliteal. Pt underwent procedure on 10/19. PMH includes HTN, type 2 diabetes, smoking, and PVD   Clinical Impression   Patient presenting with decreased I in self care, balance, functional mobility/transfers, endurance, and safety awareness.  Patient reports being independent PTA and living alone. Pt is a full time truck drive and knowledge's that it will be several weeks before he is cleared to work. Pt has a twin sister and niece that live nearby that can assist as needed.  Patient currently functioning at supervision with use of RW. OT began energy conservation education for home as well. Patient will benefit from acute OT to increase overall independence in the areas of ADLs, functional mobility, and safety awareness in order to safely discharge home.    Follow Up Recommendations  No OT follow up;Supervision - Intermittent    Equipment Recommendations  Tub/shower seat    Recommendations for Other Services Other (comment) (none at this time)     Precautions / Restrictions Precautions Precautions: Fall      Mobility Bed Mobility Overal bed mobility: Modified Independent       Transfers Overall transfer level: Needs assistance Equipment used: Rolling walker (2 wheeled) Transfers: Sit to/from [employer] Sit to Stand: Modified independent (Device/Increase time) Stand pivot transfers: Supervision            Balance Overall balance assessment: Mild deficits observed, not formally tested         ADL either performed or assessed with clinical judgement   ADL Overall ADL's : Needs assistance/impaired     General ADL Comments: Supervision overall for self care tasks with increased time      Vision Patient Visual Report: No change from baseline              Pertinent Vitals/Pain Pain Assessment: 0-10 Pain Score: 6  Pain Location: L LE Pain Descriptors / Indicators: Aching;Discomfort;Dull Pain Intervention(s): Limited activity within patient's tolerance;Repositioned;Monitored during session;Patient requesting pain meds-RN notified     Hand Dominance Right   Extremity/Trunk Assessment Upper Extremity Assessment Upper Extremity Assessment: Overall WFL for tasks assessed   Lower Extremity Assessment Lower Extremity Assessment: Defer to PT evaluation   Cervical / Trunk Assessment Cervical / Trunk Assessment: Normal   Communication Communication Communication: No difficulties   Cognition Arousal/Alertness: Awake/alert Behavior During Therapy: WFL for tasks assessed/performed Overall Cognitive Status: Within Functional Limits for tasks assessed                  Home Living Family/patient expects to be discharged to:: Private residence Living Arrangements: Alone Available Help at Discharge: Family;Available PRN/intermittently (twin sister lives nearby as well as other family members) Type of Home: House       Home Layout: One level     Bathroom Shower/Tub: Chief Strategy Officer: Standard Bathroom Accessibility: Yes How Accessible: Accessible via walker Home Equipment: None          Prior Functioning/Environment Level of Independence: Independent        Comments: working full time as a Product/process development scientist Problem List: Decreased activity tolerance;Decreased safety awareness;Decreased knowledge of use of DME or AE;Impaired balance (sitting and/or standing);Decreased knowledge of precautions;Pain      OT Treatment/Interventions: Self-care/ADL training;Therapeutic  exercise;Therapeutic activities;Energy conservation;DME and/or AE instruction;Patient/family education;Balance training;Manual therapy    OT Goals(Current  goals can be found in the care plan section) Acute Rehab OT Goals Patient Stated Goal: to go home OT Goal Formulation: With patient Time For Goal Achievement: 09/23/20 Potential to Achieve Goals: Good ADL Goals Pt Will Perform Lower Body Dressing: with modified independence;sit to/from stand Pt Will Transfer to Toilet: with modified independence;ambulating Pt Will Perform Toileting - Clothing Manipulation and hygiene: with modified independence;sitting/lateral leans  OT Frequency: Min 1X/week   Barriers to D/C: Other (comment)  none at this time          AM-PAC OT "6 Clicks" Daily Activity     Outcome Measure Help from another person eating meals?: None Help from another person taking care of personal grooming?: None Help from another person toileting, which includes using toliet, bedpan, or urinal?: None Help from another person bathing (including washing, rinsing, drying)?: None Help from another person to put on and taking off regular upper body clothing?: None Help from another person to put on and taking off regular lower body clothing?: A Little 6 Click Score: 23   End of Session Nurse Communication: Mobility status;Patient requests pain meds  Activity Tolerance: Patient tolerated treatment well Patient left: in bed;with call bell/phone within reach;with bed alarm set  OT Visit Diagnosis: Muscle weakness (generalized) (M62.81);Pain Pain - Right/Left: Left Pain - part of body: Leg                Time: 1535-1556 OT Time Calculation (min): 21 min Charges:  OT General Charges $OT Visit: 1 Visit OT Evaluation $OT Eval Low Complexity: 1 Low OT Treatments $Self Care/Home Management : 8-22 mins  Jackquline Denmark, MS, OTR/L , CBIS ascom [phone]  09/09/20, 4:04 PM

## 2020-09-09 NOTE — Consult Note (Signed)
Pharmacy Antibiotic Note  Carl Norris is a 64 y.o. male admitted on 09/03/2020 with fever s/p left femoral endarterectomy with stenting of the SFA popliteal. Pt underwent procedure on 10/19. PMH includes HTN, type 2 diabetes, smoking, and PVD. Pharmacy has been consulted for vancomycin and cefepime dosing.  POD #5  Day # 2 antibiotics. Temperature in the past 24 hours 97.60F - 99.80F and last fever yesterday morning.   Plan: - Will change to 1000 mg q12H. Expected Cssmin 15.6 (using Td 0.5 L/kg given BMI >/= 30 and IBW for calculation) . Plan to get vancomycin level in the next 2-3 days.  - Cefepime 2 g IV q8h - Monitor renal function  Height: 5\' 10"  (177.8 cm) Weight: 96.3 kg (212 lb 4.9 oz) IBW/kg (Calculated) : 73  Temp (24hrs), Avg:98.6 F (37 C), Min:97.9 F (36.6 C), Max:99.2 F (37.3 C)  Recent Labs  Lab 09/03/20 1434 09/04/20 0023 09/04/20 2049 09/05/20 0609 09/07/20 1314 09/08/20 0419 09/09/20 0541  WBC  --  7.4 10.5 10.9* 12.2*  --  8.9  CREATININE 1.27*  --   --  1.22  --  0.86 0.93    Estimated Creatinine Clearance: 94.6 mL/min (by C-G formula based on SCr of 0.93 mg/dL).    Allergies  Allergen Reactions  . Oxycodone-Acetaminophen Itching    Antimicrobials this admission: 10/19 cefazolin >> 10/21 10/23 cefepime >> 10/23 vancomycin >>  Microbiology results: 10/22 BCx: NGTD 10/20 MRSA PCR: negative  Thank you for allowing pharmacy to be a part of this patient's care.  11/20, PharmD 09/09/2020 1:25 PM

## 2020-09-09 NOTE — Progress Notes (Signed)
[facility] Daily Progress Note  Subjective: 09/03/20:             1.  Left lower extremity distal runoff third order catheter placement             2.  Star close right common femoral artery   09/04/20: 1. Left common femoral, profunda femoris, and superficial femoral artery endarterectomies and patch angioplasty 2.   3 and 4 Fogarty embolectomy to the left SFA and popliteal arteries 3.   Left lower extremity angiogram  4.   Placement of 2 Viabahn stents in the left SFA and popliteal arteries both being 7 mm diameter by 25 cm in length  Patient with continued left thigh pain.  No issues overnight.  Denies feeling feverish over last 24 hours.  Objective: Vitals:   09/08/20 2130 09/08/20 2304 09/09/20 0402 09/09/20 0926  BP:  (!) 141/69 (!) 158/82 135/67  Pulse:  73 75 64  Resp:  19 17 17   Temp: 98.2 F (36.8 C) 99 F (37.2 C) 98.9 F (37.2 C) 99.2 F (37.3 C)  TempSrc: Oral Oral Oral Oral  SpO2:  99% 99% 99%  Weight:      Height:        Intake/Output Summary (Last 24 hours) at 09/09/2020 1346 Last data filed at 09/09/2020 0935 Gross per 24 hour  Intake 1698.56 ml  Output 1300 ml  Net 398.56 ml   Physical Exam: A&Ox3, NAD CV: RRR Pulmonary: CTA Bilaterally Abdomen: Soft, Nontender, Nondistended Left Groin:  Incision is clean dry and intact.  Minimal surrounding ecchymosis.  Dermabond intact.  No swelling or drainage. Vascular:  Left lower extremity: Thigh soft.  Calf soft.  Extremities warm distally toes.  Palpable pedal pulses.  Good capillary refill.  Motor/sensory is intact.   Laboratory: CBC    Component Value Date/Time   WBC 8.9 09/09/2020 0541   HGB 9.5 (L) 09/09/2020 0541   HGB 16.0 03/16/2014 1656   HCT 27.9 (L) 09/09/2020 0541   HCT 47.3 03/16/2014 1656   PLT 259 09/09/2020 0541   PLT 200 03/16/2014 1656   BMET    Component Value Date/Time   NA 135 09/09/2020 0541   NA 141 03/16/2014 1656   K 4.4 09/09/2020 0541   K  4.0 03/16/2014 1656   CL 106 09/09/2020 0541   CL 106 03/16/2014 1656   CO2 23 09/09/2020 0541   CO2 29 03/16/2014 1656   GLUCOSE 135 (H) 09/09/2020 0541   GLUCOSE 79 03/16/2014 1656   BUN 11 09/09/2020 0541   BUN 16 03/16/2014 1656   CREATININE 0.93 09/09/2020 0541   CREATININE 0.97 03/16/2014 1656   CALCIUM 7.5 (L) 09/09/2020 0541   CALCIUM 8.9 03/16/2014 1656   GFRNONAA >60 09/09/2020 0541   GFRNONAA >60 03/16/2014 1656   GFRAA >60 03/16/2014 1656   Assessment/Planning: The patient is a 64 year old male with 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  Had a long discussion in regard to the importance of ambulation.  Patient will ambulate. 4) would assume discharge tomorrow based on PT/OT recommendations  Discussed with Dr. [facility] PA-C 09/09/2020 1:46 PM

## 2020-09-09 NOTE — Consult Note (Signed)
NAME: Carl Norris  DOB: Jun 17, 1956  MRN: [id number]  Date/Time: 09/09/2020 11:16 AM  REQUESTING PROVIDER: Dr.Schnier Subjective:  REASON FOR CONSULT: post Op fever ? Carl Norris is a 64 y.o. with a history of HTN, Peripheral vascualr disease with severe claudication pain underwent   angio on 09/03/20 for Atherosclerotic occlusive disease bilateral lower extremities with rest pain of the left lower extremity; complication of  vascular device with thrombosis of superficial femoral artery and popliteal artery stents. Then on 09/04/2020 he had t  Left common femoral, profunda femoris, and superficial femoral artery endarterectomies and patch angioplasty 2.   3 and 4 Fogarty embolectomy to the left SFA and popliteal arteries 3.   Left lower extremity angiogram 4.   Placement of 2 Viabahn stents in the left SFA and popliteal arteries both being 7 mm diameter by 25 cm in length Fever since 09/05/20 with Tmax of 103.1 on 09/06/20 at 4pm. Blood culture sent and he has been started on vanco and cefepime and ID is consulted for the same. Pt says he is feeling a little better today. Current smoker Lives on his own  Past Medical History:  Diagnosis Date  . Hypertension   . Peripheral vascular disease [facility])     Past Surgical History:  Procedure Laterality Date  . LOWER EXTREMITY ANGIOGRAPHY Left 08/27/2020   Procedure: LOWER EXTREMITY ANGIOGRAPHY;  Surgeon: Renford Dills, MD;  Location: [facility] INVASIVE CV LAB;  Service: Cardiovascular;  Laterality: Left;  . LOWER EXTREMITY ANGIOGRAPHY Left 09/03/2020   Procedure: LOWER EXTREMITY ANGIOGRAPHY;  Surgeon: Renford Dills, MD;  Location: [facility] INVASIVE CV LAB;  Service: Cardiovascular;  Laterality: Left;  . NECK SURGERY    . THROMBECTOMY FEMORAL ARTERY Left 09/04/2020   Procedure: THROMBECTOMY FEMORAL ARTERY possible stent;  Surgeon: Renford Dills, MD;  Location: [facility] ORS;  Service: Vascular;  Laterality: Left;    Social History    Socioeconomic History  . Marital status: Single    Spouse name: Not on file  . Number of children: 0  . Years of education: Not on file  . Highest education level: Not on file  Occupational History  . Not on file  Tobacco Use  . Smoking status: Current Every Day Smoker    Packs/day: 2.00    Years: 47.00    Pack years: 94.00    Types: Cigarettes  . Smokeless tobacco: Never Used  . Tobacco comment: SMOKED TODAY   Substance and Sexual Activity  . Alcohol use: Yes    Comment: socially  . Drug use: Never  . Sexual activity: Not on file  Other Topics Concern  . Not on file  Social History Narrative   Lives by himself    Social Determinants of Health   Financial Resource Strain:   . Difficulty of Paying Living Expenses: Not on file  Food Insecurity:   . Worried About Programme researcher, broadcasting/film/video in the Last Year: Not on file  . Ran Out of Food in the Last Year: Not on file  Transportation Needs:   . Lack of Transportation (Medical): Not on file  . Lack of Transportation (Non-Medical): Not on file  Physical Activity:   . Days of Exercise per Week: Not on file  . Minutes of Exercise per Session: Not on file  Stress:   . Feeling of Stress : Not on file  Social Connections:   . Frequency of Communication with Friends and Family: Not on file  . Frequency of Social Gatherings with Friends  and Family: Not on file  . Attends Religious Services: Not on file  . Active Member of Clubs or Organizations: Not on file  . Attends Banker Meetings: Not on file  . Marital Status: Not on file  Intimate Partner Violence:   . Fear of Current or Ex-Partner: Not on file  . Emotionally Abused: Not on file  . Physically Abused: Not on file  . Sexually Abused: Not on file    Family History  Problem Relation Age of Onset  . Cancer Father   . Vascular Disease Sister    Allergies  Allergen Reactions  . Oxycodone-Acetaminophen Itching   ? Current Facility-Administered Medications   Medication Dose Route Frequency Provider Last Rate Last Admin  . 0.9 %  sodium chloride infusion  250 mL Intravenous PRN Schnier, Latina Craver, MD      . acetaminophen (TYLENOL) tablet 650 mg  650 mg Oral Q6H PRN Esco, Miechia A, MD   650 mg at 09/08/20 0825  . aspirin EC tablet 81 mg  81 mg Oral Daily Schnier, Latina Craver, MD   81 mg at 09/09/20 1610  . atorvastatin (LIPITOR) tablet 10 mg  10 mg Oral Daily Schnier, Latina Craver, MD   10 mg at 09/09/20 9604  . ceFEPIme (MAXIPIME) 2 g in sodium chloride 0.9 % 100 mL IVPB  2 g Intravenous Q8H Reatha Armour, RPH   Stopped at 09/09/20 5409  . clopidogrel (PLAVIX) tablet 75 mg  75 mg Oral Daily Schnier, Latina Craver, MD   75 mg at 09/09/20 8119  . gabapentin (NEURONTIN) capsule 300 mg  300 mg Oral QHS Schnier, Latina Craver, MD   300 mg at 09/08/20 2236  . hydrochlorothiazide (HYDRODIURIL) tablet 25 mg  25 mg Oral Daily Stegmayer, Ranae Plumber, PA-C      . HYDROcodone-acetaminophen (NORCO) 7.5-325 MG per tablet 1-2 tablet  1-2 tablet Oral Q6H PRN Schnier, Latina Craver, MD   1 tablet at 09/09/20 0933  . metoprolol succinate (TOPROL-XL) 24 hr tablet 100 mg  100 mg Oral Daily Schnier, Latina Craver, MD   100 mg at 09/09/20 1478  . morphine 2 MG/ML injection 2-4 mg  2-4 mg Intravenous Q3H PRN Schnier, Latina Craver, MD   2 mg at 09/08/20 0800  . nicotine (NICODERM CQ - dosed in mg/24 hours) patch 21 mg  21 mg Transdermal Daily Schnier, Latina Craver, MD   21 mg at 09/09/20 2956  . ondansetron (ZOFRAN) injection 4 mg  4 mg Intravenous Q6H PRN Schnier, Latina Craver, MD   4 mg at 09/08/20 1701  . sodium chloride flush (NS) 0.9 % injection 3 mL  3 mL Intravenous Q12H Schnier, Latina Craver, MD   3 mL at 09/07/20 1545  . sodium chloride flush (NS) 0.9 % injection 3 mL  3 mL Intravenous PRN Schnier, Latina Craver, MD      . vancomycin (VANCOREADY) IVPB 1500 mg/300 mL  1,500 mg Intravenous Q12H Ronnald Ramp, RPH 150 mL/hr at 09/09/20 0549 Rate Verify at 09/09/20 0549  . vitamin B-12  (CYANOCOBALAMIN) tablet 1,000 mcg  1,000 mcg Oral Daily Stegmayer, Kimberly A, PA-C         Abtx:  Anti-infectives (From admission, onward)   Start     Dose/Rate Route Frequency Ordered Stop   09/08/20 1800  vancomycin (VANCOREADY) IVPB 1500 mg/300 mL        1,500 mg 150 mL/hr over 120 Minutes Intravenous Every 12 hours 09/08/20 1121  09/08/20 0600  vancomycin (VANCOREADY) IVPB 1250 mg/250 mL  Status:  Discontinued        1,250 mg 166.7 mL/hr over 90 Minutes Intravenous Every 12 hours 09/07/20 1704 09/08/20 1121   09/07/20 1830  vancomycin (VANCOREADY) IVPB 2000 mg/400 mL        2,000 mg 200 mL/hr over 120 Minutes Intravenous  Once 09/07/20 1704 09/07/20 2111   09/07/20 1800  ceFEPIme (MAXIPIME) 2 g in sodium chloride 0.9 % 100 mL IVPB        2 g 200 mL/hr over 30 Minutes Intravenous Every 8 hours 09/07/20 1704     09/05/20 0000  ceFAZolin (ANCEF) IVPB 1 g/50 mL premix        1 g 100 mL/hr over 30 Minutes Intravenous Every 8 hours 09/04/20 2109 09/05/20 1653   09/04/20 1615  ceFAZolin (ANCEF) IVPB 2g/100 mL premix        2 g 200 mL/hr over 30 Minutes Intravenous On call to O.R. 09/04/20 1606 09/04/20 1649   09/04/20 1600  ceFAZolin (ANCEF) IVPB 2g/100 mL premix  Status:  Discontinued        2 g 200 mL/hr over 30 Minutes Intravenous  Once 09/04/20 1556 09/04/20 1607   09/04/20 1553  ceFAZolin (ANCEF) 2-4 GM/100ML-% IVPB       Note to Pharmacy: Marylee Floras   : cabinet override      09/04/20 1553 09/04/20 1707   09/03/20 1400  ceFAZolin (ANCEF) IVPB 2g/100 mL premix        2 g 200 mL/hr over 30 Minutes Intravenous  Once 09/03/20 1358 09/03/20 1535      REVIEW OF SYSTEMS:  Const:  fever,  chills, negative weight loss Eyes: negative diplopia or visual changes, negative eye pain ENT: negative coryza, negative sore throat Resp: negative cough, hemoptysis, dyspnea Cards: negative for chest pain, palpitations, lower extremity edema GU: negative for frequency, dysuria and  hematuria GI: Negative for abdominal pain, diarrhea, bleeding, constipation Skin: negative for rash and pruritus Heme: negative for easy bruising and gum/nose bleeding KZ:SWFU left groin area , negative for myalgias, arthralgias, back pain and muscle weakness Neurolo:negative for headaches, dizziness, vertigo, memory problems  Psych: negative for feelings of anxiety, depression  Endocrine: negative for thyroid, diabetes Allergy/Immunology-oxycodone  Objective:  VITALS:  BP 135/67 (BP Location: Left Arm)   Pulse 64   Temp 99.2 F (37.3 C) (Oral)   Resp 17   Ht 5\' 10"  (1.778 m)   Wt 96.3 kg   SpO2 99%   BMI 30.46 kg/m  PHYSICAL EXAM:  General: Alert, cooperative, no distress, appears stated age.  Head: Normocephalic, without obvious abnormality, atraumatic. Eyes: Conjunctivae clear, anicteric sclerae. Pupils are equal ENT Nares normal. No drainage or sinus tenderness. Lips, mucosa, and tongue normal. No Thrush Neck: Supple, symmetrical, no adenopathy, thyroid: non tender no carotid bruit and no JVD. Back: No CVA tenderness. Lungs: Clear to auscultation bilaterally. No Wheezing or Rhonchi. No rales. Heart: Regular rate and rhythm, no murmur, rub or gallop. Abdomen: Soft, non-tender,not distended. Bowel sounds normal. No masses Left femoral  area- surgical incision clean, staples, minimal erythema Some tenderness Extremities: atraumatic, no cyanosis. No edema. No clubbing Skin: No rashes or lesions. Or bruising Lymph: Cervical, supraclavicular normal. Neurologic: Grossly non-focal Pertinent Labs Lab Results CBC    Component Value Date/Time   WBC 8.9 09/09/2020 0541   RBC 2.87 (L) 09/09/2020 0541   HGB 9.5 (L) 09/09/2020 0541   HGB 16.0 03/16/2014 1656  HCT 27.9 (L) 09/09/2020 0541   HCT 47.3 03/16/2014 1656   PLT 259 09/09/2020 0541   PLT 200 03/16/2014 1656   MCV 97.2 09/09/2020 0541   MCV 97 03/16/2014 1656   MCH 33.1 09/09/2020 0541   MCHC 34.1 09/09/2020 0541    RDW 12.3 09/09/2020 0541   RDW 13.6 03/16/2014 1656   LYMPHSABS 1.2 09/09/2020 0541   LYMPHSABS 2.7 03/16/2014 1656   MONOABS 1.2 (H) 09/09/2020 0541   MONOABS 1.3 (H) 03/16/2014 1656   EOSABS 0.2 09/09/2020 0541   EOSABS 0.3 03/16/2014 1656   BASOSABS 0.1 09/09/2020 0541   BASOSABS 0.1 03/16/2014 1656    CMP Latest Ref Rng & Units 09/09/2020 09/08/2020 09/05/2020  Glucose 70 - 99 mg/dL 161(W135(H) 960(A121(H) 540(J285(H)  BUN 8 - 23 mg/dL 11 11 20   Creatinine 0.61 - 1.24 mg/dL 8.110.93 9.140.86 7.821.22  Sodium 135 - 145 mmol/L 135 137 138  Potassium 3.5 - 5.1 mmol/L 4.4 3.7 4.2  Chloride 98 - 111 mmol/L 106 103 100  CO2 22 - 32 mmol/L 23 28 29   Calcium 8.9 - 10.3 mg/dL 7.5(L) 7.5(L) 7.8(L)  Total Protein 6.5 - 8.1 g/dL 9.5(A5.8(L) - -  Total Bilirubin 0.3 - 1.2 mg/dL 0.6 - -  Alkaline Phos 38 - 126 U/L 32(L) - -  AST 15 - 41 U/L 19 - -  ALT 0 - 44 U/L 16 - -      Microbiology: Recent Results (from the past 240 hour(s))  SARS Coronavirus 2 by RT PCR (hospital order, performed in [facility] lab) Nasopharyngeal Nasopharyngeal Swab     Status: None   Collection Time: 09/03/20 11:46 AM   Specimen: Nasopharyngeal Swab  Result Value Ref Range Status   SARS Coronavirus 2 NEGATIVE NEGATIVE Final    Comment: (NOTE) SARS-CoV-2 target nucleic acids are NOT DETECTED.  The SARS-CoV-2 RNA is generally detectable in upper and lower respiratory specimens during the acute phase of infection. The lowest concentration of SARS-CoV-2 viral copies this assay can detect is 250 copies / mL. A negative result does not preclude SARS-CoV-2 infection and should not be used as the sole basis for treatment or other patient management decisions.  A negative result may occur with improper specimen collection / handling, submission of specimen other than nasopharyngeal swab, presence of viral mutation(s) within the areas targeted by this assay, and inadequate number of viral copies (<250 copies / mL). A negative  result must be combined with clinical observations, patient history, and epidemiological information.  Fact Sheet for Patients:   BoilerBrush.com.cyhttps://www.fda.gov/media/136312/download  Fact Sheet for Healthcare Providers: https://pope.com/https://www.fda.gov/media/136313/download  This test is not yet approved or  cleared by the [place] FDA and has been authorized for detection and/or diagnosis of SARS-CoV-2 by FDA under an Emergency Use Authorization (EUA).  This EUA will remain in effect (meaning this test can be used) for the duration of the COVID-19 declaration under Section 564(b)(1) of the Act, 21 U.S.C. section 360bbb-3(b)(1), unless the authorization is terminated or revoked sooner.  Performed at [facility], [address] [place], [place] [address]   MRSA PCR Screening     Status: None   Collection Time: 09/04/20  6:11 AM   Specimen: Nasopharyngeal  Result Value Ref Range Status   MRSA by PCR NEGATIVE NEGATIVE Final    Comment:        The GeneXpert MRSA Assay (FDA approved for NASAL specimens only), is one component of a comprehensive MRSA colonization surveillance program. It is  not intended to diagnose MRSA infection nor to guide or monitor treatment for MRSA infections. Performed at [facility], [address] [place], [place] [address]   CULTURE, BLOOD (ROUTINE X 2) w Reflex to ID Panel     Status: None (Preliminary result)   Collection Time: 09/06/20  6:08 PM   Specimen: BLOOD  Result Value Ref Range Status   Specimen Description BLOOD RIGHT ANTECUBITAL  Final   Special Requests   Final    BOTTLES DRAWN AEROBIC AND ANAEROBIC Blood Culture adequate volume   Culture   Final    NO GROWTH 3 DAYS Performed at [facility], [address] [place], [place] [address]    Report Status PENDING  Incomplete  CULTURE, BLOOD (ROUTINE X 2) w Reflex to ID Panel     Status: None (Preliminary result)   Collection Time: 09/06/20  6:14 PM   Specimen: BLOOD   Result Value Ref Range Status   Specimen Description BLOOD LEFT ANTECUBITAL  Final   Special Requests   Final    BOTTLES DRAWN AEROBIC AND ANAEROBIC Blood Culture adequate volume   Culture   Final    NO GROWTH 3 DAYS Performed at [facility], [address] [place], [place] [address]    Report Status PENDING  Incomplete    IMAGING RESULTS: I have personally reviewed the films ? Impression/Recommendation ? ?open endarterectomies of Left CFA, SFA and profunda on 10/21 with patch angioplasy. Fever 24 hrs after the procedure On vanco and cefepime Cultures pending ?will have to treat like an endovascular infection- depending on blood culture may decide on IV VS PO route currently on IV vanco and cefepime  Will discus with primary team  HTn  DM- type 2 Spastic LES   Smoker Pt is interested in getting COVID vaccine-can get it a week or so post discharge- will inform his PCP ___________________________________________________ Discussed with patient, Note:  This document was prepared using Dragon voice recognition software and may include unintentional dictation errors.

## 2020-09-09 NOTE — Evaluation (Signed)
Physical Therapy Evaluation Patient Details Name: Carl Norris MRN: [id number] DOB: [birth year] Today's Date: 09/09/2020   History of Present Illness  Carl Norris is a 64 year old male with PMH of HTN, T2DM, smoking, and PVD. He was admitted with fever s/p left femoral endarterectomy with stenting of the SFA popliteal on 09/03/20.     Clinical Impression  Pt presents supine in bed on arrival to room and is agreeable to PT evaluation. Pt performed bed mobility and sit<>stand modified independent. During sit to stand transfer, he demonstrates R lateral lean to avoid compression/pain of LLE. Pt ambulated 250 feet with step through pattern, but decreased gait speed. Pt notes he is currently walker slower than at his baseline but is able to walk faster if needed. Pt walked 10 feet in 8.08 seconds, indicating he is a household ambulator at this time. Once in room, pt performs seated exercises and encouraged to perform throughout his day. Pt left in bed with all needs. Pt will be discharged in house and no PT follow-up needed as pt feels he is close to his baseline. He is encouraged to continue walking with nursing and mobility techs while he is in the hospital. Please re-order PT if needs change.    Follow Up Recommendations No PT follow up    Equipment Recommendations  Rolling walker with 5" wheels;Cane;Other (comment) (single point cane)    Recommendations for Other Services       Precautions / Restrictions Precautions Precautions: Fall Restrictions Weight Bearing Restrictions: No      Mobility  Bed Mobility Overal bed mobility: Modified Independent             General bed mobility comments: Patient uses bed rails and therapist hand for support to sit EOB    Transfers Overall transfer level: Needs assistance Equipment used: Rolling walker (2 wheeled) Transfers: Sit to/from Stand Sit to Stand: Modified independent (Device/Increase time) Stand pivot transfers:  Supervision       General transfer comment: Pt performs transfer without any external assistance but cued to push from the bed. Pt demonstrates R lateral lean during sit to stand to avoid compression/pain of LLE.  Ambulation/Gait Ambulation/Gait assistance: Supervision Gait Distance (Feet): 250 Feet Assistive device: Rolling walker (2 wheeled) Gait Pattern/deviations: Step-through pattern;Decreased step length - right;Decreased step length - left Gait velocity: Pt walked 10 feet in 8.08 seconds = 1.23 feet/secs Gait velocity interpretation: <1.31 ft/sec, indicative of household ambulator General Gait Details: Pt demonstrated step through pattern but decreased gait speed. Pt notes he is currently walker slower than at his baseline. No over LOB noted throughout session  Stairs            Wheelchair Mobility    Modified Rankin (Stroke Patients Only)       Balance Overall balance assessment: Independent                                           Pertinent Vitals/Pain Pain Assessment: Faces Pain Score: 6  Faces Pain Scale: Hurts a little bit Pain Location: L LE Pain Descriptors / Indicators: Aching;Discomfort;Dull;Grimacing;Guarding Pain Intervention(s): Monitored during session    Home Living Family/patient expects to be discharged to:: Private residence Living Arrangements: Alone Available Help at Discharge: Family;Available PRN/intermittently (twin sister lives nearby as well as other family members) Type of Home: Mobile home Home Access: Stairs to enter Entrance Stairs-Rails: Can reach  both Entrance Stairs-Number of Steps: 4 Home Layout: One level Home Equipment: None      Prior Function Level of Independence: Independent         Comments: Pt is still working and driving. He reports 0 falls in the last 6 months     Hand Dominance   Dominant Hand: Left    Extremity/Trunk Assessment   Upper Extremity Assessment Upper Extremity  Assessment: Overall WFL for tasks assessed (5/5 UE strength)    Lower Extremity Assessment Lower Extremity Assessment: Overall WFL for tasks assessed (5/5 LE strength)    Cervical / Trunk Assessment Cervical / Trunk Assessment: Normal  Communication   Communication: No difficulties  Cognition Arousal/Alertness: Awake/alert Behavior During Therapy: WFL for tasks assessed/performed Overall Cognitive Status: Within Functional Limits for tasks assessed                                 General Comments: Patient is A&Ox4      General Comments      Exercises Total Joint Exercises Knee Flexion: Strengthening;Both;10 reps;Seated (manual resistance) General Exercises - Upper Extremity Elbow Flexion: Strengthening;Both;10 reps;Seated (manual resistance) Elbow Extension: Strengthening;Both;10 reps;Seated (manual resistance) General Exercises - Lower Extremity Ankle Circles/Pumps: Strengthening;Both;10 reps;Seated (manual resistance) Long Arc Quad: Strengthening;Both;10 reps;Seated (manual resistance)   Assessment/Plan    PT Assessment Patent does not need any further PT services  PT Problem List         PT Treatment Interventions      PT Goals (Current goals can be found in the Care Plan section)  Acute Rehab PT Goals Patient Stated Goal: to go home PT Goal Formulation: With patient Time For Goal Achievement: 09/09/20 Potential to Achieve Goals: Good    Frequency     Barriers to discharge        Co-evaluation               AM-PAC PT "6 Clicks" Mobility  Outcome Measure Help needed turning from your back to your side while in a flat bed without using bedrails?: None Help needed moving from lying on your back to sitting on the side of a flat bed without using bedrails?: None Help needed moving to and from a bed to a chair (including a wheelchair)?: None Help needed standing up from a chair using your arms (e.g., wheelchair or bedside chair)?:  None Help needed to walk in hospital room?: None Help needed climbing 3-5 steps with a railing? : A Little 6 Click Score: 23    End of Session Equipment Utilized During Treatment: Gait belt Activity Tolerance: Patient tolerated treatment well Patient left: in bed;with call bell/phone within reach Nurse Communication: Mobility status PT Visit Diagnosis: Difficulty in walking, not elsewhere classified (R26.2);Pain Pain - Right/Left: Left Pain - part of body: Leg    Time: 1411-1434 PT Time Calculation (min) (ACUTE ONLY): 23 min   Charges:             Katherine Basset, SPT Baker Pierini 09/09/2020, 4:51 PM

## 2020-09-10 LAB — CBC
HCT: 27.7 % — ABNORMAL LOW (ref 39.0–52.0)
Hemoglobin: 9.6 g/dL — ABNORMAL LOW (ref 13.0–17.0)
MCH: 33.7 pg (ref 26.0–34.0)
MCHC: 34.7 g/dL (ref 30.0–36.0)
MCV: 97.2 fL (ref 80.0–100.0)
Platelets: 295 10*3/uL (ref 150–400)
RBC: 2.85 MIL/uL — ABNORMAL LOW (ref 4.22–5.81)
RDW: 12.4 % (ref 11.5–15.5)
WBC: 7.8 10*3/uL (ref 4.0–10.5)
nRBC: 0 % (ref 0.0–0.2)

## 2020-09-10 LAB — BASIC METABOLIC PANEL
Anion gap: 5 (ref 5–15)
BUN: 11 mg/dL (ref 8–23)
CO2: 26 mmol/L (ref 22–32)
Calcium: 7.8 mg/dL — ABNORMAL LOW (ref 8.9–10.3)
Chloride: 104 mmol/L (ref 98–111)
Creatinine, Ser: 0.82 mg/dL (ref 0.61–1.24)
GFR, Estimated: >60 mL/min (ref >60)
Glucose, Bld: 123 mg/dL — ABNORMAL HIGH (ref 70–99)
Potassium: 4 mmol/L (ref 3.5–5.1)
Sodium: 135 mmol/L (ref 135–145)

## 2020-09-10 LAB — MAGNESIUM: Magnesium: 2.3 mg/dL (ref 1.7–2.4)

## 2020-09-10 MED ORDER — AMOXICILLIN-POT CLAVULANATE 875-125 MG PO TABS: 1.0000 | ORAL_TABLET | Freq: Two times a day (BID) | ORAL | 0 refills | Status: DC

## 2020-09-10 MED ORDER — HYDROCODONE-ACETAMINOPHEN 7.5-325 MG PO TABS: 1.0000 | ORAL_TABLET | Freq: Four times a day (QID) | ORAL | 0 refills | Status: DC | PRN

## 2020-09-10 NOTE — Progress Notes (Signed)
Discharge instructions reviewed with the patient. IV removed. Patient sent out via wheelchair with belongings to his waiting ride

## 2020-09-10 NOTE — Discharge Instructions (Signed)
Vascular Surgery Discharge Instructions:  1) You may shower. Please keep your groin clean and dry. Gently clean with soap and water. Gently pat dry. 2) No driving or drinking alcohol while taking pain medication. 3) No lifting greater than 10 pounds for at least 2 to 3 weeks.

## 2020-09-10 NOTE — Clinical Social Work Note (Addendum)
PT recommended a rolling walker or single-point cane. OT recommended a shower seat/bench. CSW met with patient and told him he had to pick between the walker and the cane. He preferred the cane. Per [employer] representative, patient would have to pay around $60 out of pocket for the shower seat/bench. Patient declined. CSW called [employer] representative to order single-point cane. Sent secure chat to vascular PA requesting DME order. Patient stated he thinks he is discharging home today.  Sarah Boswell, CSW [phone]  11:48 am Patient has orders to discharge home today. DME order for cane is in. [employer] is aware and will deliver to room today. No further concerns. CSW signing off.  Sarah Boswell, CSW [phone]  

## 2020-09-10 NOTE — Discharge Summary (Signed)
[facility]    Discharge Summary  Patient ID:  Carl Norris MRN: [id number] DOB/AGE: 04/02/1956 65 y.o.  Admit date: 09/03/2020 Discharge date: 09/10/2020 Date of Surgery: 09/04/2020 Surgeon: Surgeon(s): Schnier, Latina Craver, MD Annice Needy, MD  Admission Diagnosis: Atherosclerosis of artery of extremity with rest pain [facility]) [I70.229]  Discharge Diagnoses:  Atherosclerosis of artery of extremity with rest pain [facility]) [I70.229]  Secondary Diagnoses: Past Medical History:  Diagnosis Date  . Hypertension   . Peripheral vascular disease (HCC)    Procedure(s): 09/10/20: 1. Left common femoral, superficial femoral and profunda femoris endarterectomy with Cormatrix patch angioplasty 2. Thrombectomy of the left superficial femoral artery with a #3 Fogarty balloon catheter. 3. Left lower extremity angiography 4. Placement of 2 Viabahn overlapping stents in the left superficial femoral artery and popliteal artery  Discharged Condition: Good  HPI / Hospital Course:  Minor Iden 64 year old male who presents with complaints of lifestyle limiting claudication and pain continuously in the left lower extremity. The patient has documented severe atherosclerotic occlusive disease and has undergone minimally invasive treatments in the past. However, at this point his primary area of stricture stenosis resides in the common femoral and origins of the superficial femoral and profunda femoris extending into these arteries and therefore this is not amenable to intervention. The patient is therefore undergoing open endarterectomy. The risks and benefits of surgery have been reviewed with the patient, all questions have answered; alternative therapies have been reviewed as well and the patient has agreed to proceed with surgical open repair.  On 09/10/20, the patient underwent: Left common femoral, superficial femoral and profunda femoris endarterectomy with Cormatrix  patch angioplasty Thrombectomy of the left superficial femoral artery with a #3 Fogarty balloon catheter. Left lower extremity angiography Placement of 2 Viabahn overlapping stents in the left superficial femoral artery and popliteal artery  The patient tolerated procedure well transferred from recovery room to the surgical floor without issue.  The patient's diet was advanced, his Foley was removed, his pain was controlled the use of p.o. pain medication he was ambulating at baseline.  The patient was seen by both physical therapy and occupational therapy and deemed appropriate to be discharged home.  Patient's stay was complicated by fever for a day which was treated with IV antibiotics and then transition to Augmentin at discharge for 1 week.  Day of discharge, the patient was afebrile with stable vital signs and improvement in physical exam.  Physical exam:  A&Ox3, NAD CV: RRR Pulmonary: CTA Bilaterally Abdomen: Soft, Nontender, Nondistended Left Groin:             Incision is clean dry and intact.  Minimal surrounding ecchymosis.  Dermabond intact.  No swelling or drainage. Vascular:             Left lower extremity: Thigh soft.  Calf soft.  Extremities warm distally toes.  Palpable pedal pulses.  Good capillary refill.  Motor/sensory is intact.  Labs: As below  Consults: None  Significant Diagnostic Studies: CBC Lab Results  Component Value Date   WBC 7.8 09/10/2020   HGB 9.6 (L) 09/10/2020   HCT 27.7 (L) 09/10/2020   MCV 97.2 09/10/2020   PLT 295 09/10/2020   BMET    Component Value Date/Time   NA 135 09/10/2020 0528   NA 141 03/16/2014 1656   K 4.0 09/10/2020 0528   K 4.0 03/16/2014 1656   CL 104 09/10/2020 0528   CL 106 03/16/2014 1656  CO2 26 09/10/2020 0528   CO2 29 03/16/2014 1656   GLUCOSE 123 (H) 09/10/2020 0528   GLUCOSE 79 03/16/2014 1656   BUN 11 09/10/2020 0528   BUN 16 03/16/2014 1656   CREATININE 0.82 09/10/2020 0528   CREATININE 0.97 03/16/2014  1656   CALCIUM 7.8 (L) 09/10/2020 0528   CALCIUM 8.9 03/16/2014 1656   GFRNONAA >60 09/10/2020 0528   GFRNONAA >60 03/16/2014 1656   GFRAA >60 03/16/2014 1656   COAG No results found for: INR, PROTIME  Disposition:  Discharge to :Home  Allergies as of 09/10/2020      Reactions   Oxycodone-acetaminophen Itching      Medication List    TAKE these medications   amoxicillin-clavulanate 875-125 MG tablet Commonly known as: Augmentin Take 1 tablet by mouth 2 (two) times daily.   aspirin EC 81 MG tablet Take 1 tablet (81 mg total) by mouth daily. Swallow whole.   atorvastatin 10 MG tablet Commonly known as: Lipitor Take 1 tablet (10 mg total) by mouth daily.   clopidogrel 75 MG tablet Commonly known as: Plavix Take 1 tablet (75 mg total) by mouth daily.   gabapentin 300 MG capsule Commonly known as: NEURONTIN Take 1 capsule (300 mg total) by mouth at bedtime.   hydrochlorothiazide 25 MG tablet Commonly known as: HYDRODIURIL Take 25 mg by mouth daily.   HYDROcodone-acetaminophen 7.5-325 MG tablet Commonly known as: NORCO Take 1-2 tablets by mouth every 6 (six) hours as needed for moderate pain or severe pain.   metoprolol succinate 100 MG 24 hr tablet Commonly known as: TOPROL-XL Take 100 mg by mouth daily.   naproxen sodium 220 MG tablet Commonly known as: ALEVE Take 440 mg by mouth daily.   vitamin B-12 1000 MCG tablet Commonly known as: CYANOCOBALAMIN Take 1,000 mcg by mouth daily.            Durable Medical Equipment  (From admission, onward)         Start     Ordered   09/10/20 1111  For home use only DME Cane  Once       Comments: Single point cane   09/10/20 1111   09/10/20 0940  For home use only DME 3 n 1  Once        09/10/20 0940   09/10/20 0940  For home use only DME Walker rolling  Once       Question Answer Comment  Walker: With 5 Inch Wheels   Patient needs a walker to treat with the following condition PAD (peripheral artery  disease) (HCC)      09/10/20 0940         Verbal and written Discharge instructions given to the patient. Wound care per Discharge AVS  Follow-up Information    Georgiana Spinner, NP Follow up in 1 week(s).   Specialty: Vascular Surgery Why: First post-op incision check.  Tuesday, Nov. 2, 1:30 Contact information: [address] [place] [address] [phone]              Signed: Tonette Lederer, PA-C  09/10/2020, 3:41 PM

## 2020-09-10 NOTE — Progress Notes (Signed)
BP 110/64. Order received from Dr Gilda Crease to hold am metoprolol and hctz

## 2020-09-11 LAB — CULTURE, BLOOD (ROUTINE X 2)
Culture: NO GROWTH
Culture: NO GROWTH
Special Requests: ADEQUATE
Special Requests: ADEQUATE

## 2020-09-17 ENCOUNTER — Encounter (INDEPENDENT_AMBULATORY_CARE_PROVIDER_SITE_OTHER): Payer: Self-pay | Admitting: Nurse Practitioner

## 2020-09-17 ENCOUNTER — Other Ambulatory Visit: Payer: Self-pay

## 2020-09-17 ENCOUNTER — Ambulatory Visit (INDEPENDENT_AMBULATORY_CARE_PROVIDER_SITE_OTHER): Payer: 59 | Admitting: Nurse Practitioner

## 2020-09-17 VITALS — BP 145/66 | HR 80 | Resp 16 | Wt 210.8 lb

## 2020-09-17 DIAGNOSIS — E1165 Type 2 diabetes mellitus with hyperglycemia: Secondary | ICD-10-CM

## 2020-09-17 DIAGNOSIS — I10 Essential (primary) hypertension: Secondary | ICD-10-CM

## 2020-09-17 DIAGNOSIS — Z720 Tobacco use: Secondary | ICD-10-CM

## 2020-09-17 DIAGNOSIS — I70222 Atherosclerosis of native arteries of extremities with rest pain, left leg: Secondary | ICD-10-CM

## 2020-09-17 MED ORDER — MUPIROCIN 2 % EX OINT: 1.0000 "application " | TOPICAL_OINTMENT | Freq: Two times a day (BID) | CUTANEOUS | 0 refills | Status: DC

## 2020-09-17 MED ORDER — AMOXICILLIN-POT CLAVULANATE 875-125 MG PO TABS
1.0000 | ORAL_TABLET | Freq: Two times a day (BID) | ORAL | 0 refills | Status: DC
Start: 2020-09-17 — End: 2021-07-16

## 2020-09-22 ENCOUNTER — Encounter (INDEPENDENT_AMBULATORY_CARE_PROVIDER_SITE_OTHER): Payer: Self-pay | Admitting: Nurse Practitioner

## 2020-09-22 NOTE — Progress Notes (Signed)
Subjective:    Patient ID: Carl Norris, male    DOB: 01-03-1956, 64 y.o.   MRN: [id number] Chief Complaint  Patient presents with  . Follow-up    [facility] 1wk post thrombectomy femoral artery    The patient follows up today following intervention on 09/04/2020 including:  PROCEDURE: 1. Left common femoral, superficial femoral and profunda femoris endarterectomy with Cormatrix patch angioplasty 2. Thrombectomy of the left superficial femoral artery with a #3 Fogarty balloon catheter. 3. Left lower extremity angiography 4. Placement of 2 Viabahn overlapping stents in the left superficial femoral artery and popliteal artery  Today the patient reports having swelling of the left lower extremity however otherwise it feels good. He notes that there is some area of tenderness and redness and an area of the wound. He denies any opening of the wound or bleeding. He denies any fever, chills, nausea, vomiting or diarrhea. He denies significant difficulties post follow-up. The wound does have an erythematous spot mid to the incision with some small areas that are concerning for slight opening.   Review of Systems  Cardiovascular: Positive for leg swelling.  Skin: Positive for wound.  All other systems reviewed and are negative.      Objective:   Physical Exam Vitals reviewed.  HENT:     Head: Normocephalic.  Cardiovascular:     Rate and Rhythm: Normal rate.     Pulses: Normal pulses.  Musculoskeletal:     Left lower leg: Edema present.  Neurological:     Mental Status: He is alert and oriented to person, place, and time.  Psychiatric:        Mood and Affect: Mood normal.        Behavior: Behavior normal.        Thought Content: Thought content normal.        Judgment: Judgment normal.     BP (!) 145/66 (BP Location: Right Arm)   Pulse 80   Resp 16   Wt 210 lb 12.8 oz (95.6 kg)   BMI 30.25 kg/m   Past Medical History:  Diagnosis Date  . Hypertension   . Peripheral  vascular disease (HCC)     Social History   Socioeconomic History  . Marital status: Single    Spouse name: Not on file  . Number of children: 0  . Years of education: Not on file  . Highest education level: Not on file  Occupational History  . Not on file  Tobacco Use  . Smoking status: Current Every Day Smoker    Packs/day: 2.00    Years: 47.00    Pack years: 94.00    Types: Cigarettes  . Smokeless tobacco: Never Used  . Tobacco comment: SMOKED TODAY   Substance and Sexual Activity  . Alcohol use: Yes    Comment: socially  . Drug use: Never  . Sexual activity: Not on file  Other Topics Concern  . Not on file  Social History Narrative   Lives by himself    Social Determinants of Health   Financial Resource Strain:   . Difficulty of Paying Living Expenses: Not on file  Food Insecurity:   . Worried About Programme researcher, broadcasting/film/video in the Last Year: Not on file  . Ran Out of Food in the Last Year: Not on file  Transportation Needs:   . Lack of Transportation (Medical): Not on file  . Lack of Transportation (Non-Medical): Not on file  Physical Activity:   . Days of Exercise  per Week: Not on file  . Minutes of Exercise per Session: Not on file  Stress:   . Feeling of Stress : Not on file  Social Connections:   . Frequency of Communication with Friends and Family: Not on file  . Frequency of Social Gatherings with Friends and Family: Not on file  . Attends Religious Services: Not on file  . Active Member of Clubs or Organizations: Not on file  . Attends Banker Meetings: Not on file  . Marital Status: Not on file  Intimate Partner Violence:   . Fear of Current or Ex-Partner: Not on file  . Emotionally Abused: Not on file  . Physically Abused: Not on file  . Sexually Abused: Not on file    Past Surgical History:  Procedure Laterality Date  . LOWER EXTREMITY ANGIOGRAPHY Left 08/27/2020   Procedure: LOWER EXTREMITY ANGIOGRAPHY;  Surgeon: Renford Dills, MD;  Location: [facility] INVASIVE CV LAB;  Service: Cardiovascular;  Laterality: Left;  . LOWER EXTREMITY ANGIOGRAPHY Left 09/03/2020   Procedure: LOWER EXTREMITY ANGIOGRAPHY;  Surgeon: Renford Dills, MD;  Location: [facility] INVASIVE CV LAB;  Service: Cardiovascular;  Laterality: Left;  . NECK SURGERY    . THROMBECTOMY FEMORAL ARTERY Left 09/04/2020   Procedure: THROMBECTOMY FEMORAL ARTERY possible stent;  Surgeon: Renford Dills, MD;  Location: [facility] ORS;  Service: Vascular;  Laterality: Left;    Family History  Problem Relation Age of Onset  . Cancer Father   . Vascular Disease Sister     Allergies  Allergen Reactions  . Oxycodone-Acetaminophen Itching    CBC Latest Ref Rng & Units 09/10/2020 09/09/2020 09/07/2020  WBC 4.0 - 10.5 K/uL 7.8 8.9 12.2(H)  Hemoglobin 13.0 - 17.0 g/dL 1.7(C) 9.4(W) 10.3(L)  Hematocrit 39 - 52 % 27.7(L) 27.9(L) 29.7(L)  Platelets 150 - 400 K/uL 295 259 212      CMP     Component Value Date/Time   NA 135 09/10/2020 0528   NA 141 03/16/2014 1656   K 4.0 09/10/2020 0528   K 4.0 03/16/2014 1656   CL 104 09/10/2020 0528   CL 106 03/16/2014 1656   CO2 26 09/10/2020 0528   CO2 29 03/16/2014 1656   GLUCOSE 123 (H) 09/10/2020 0528   GLUCOSE 79 03/16/2014 1656   BUN 11 09/10/2020 0528   BUN 16 03/16/2014 1656   CREATININE 0.82 09/10/2020 0528   CREATININE 0.97 03/16/2014 1656   CALCIUM 7.8 (L) 09/10/2020 0528   CALCIUM 8.9 03/16/2014 1656   PROT 5.8 (L) 09/09/2020 0541   PROT 7.6 03/16/2014 1656   ALBUMIN 2.4 (L) 09/09/2020 0541   ALBUMIN 3.7 03/16/2014 1656   AST 19 09/09/2020 0541   AST 25 03/16/2014 1656   ALT 16 09/09/2020 0541   ALT 43 03/16/2014 1656   ALKPHOS 32 (L) 09/09/2020 0541   ALKPHOS 68 03/16/2014 1656   BILITOT 0.6 09/09/2020 0541   BILITOT 0.3 03/16/2014 1656   GFRNONAA >60 09/10/2020 0528   GFRNONAA >60 03/16/2014 1656   GFRAA >60 03/16/2014 1656     @COAG @  Radiology     Assessment & Plan:   1.  Atherosclerosis of native artery of left lower extremity with rest pain [facility] [place]) Patient lower extremity feeling much better although there is some reperfusion swelling. The groin site has some erythema in the area which is concerning for possible infection. He also has 2 small areas that are not quite dehisced but show some concern. We will have the patient  began on amoxicillin as well as a place Bactroban on his wound. The patient is advised to keep the wound clean and dry at all times. He should also contact us if the wound opens. Patient will return in 4 weeks with noninvasive studies.  2. Type 2 diabetes mellitus with hyperglycemia, without long-term current use of insulin (HCC) Continue hypoglycemic medications as already ordered, these medications have been reviewed and there are no changes at this time.  Hgb A1C to be monitored as already arranged by primary service   3. Essential (primary) hypertension Continue antihypertensive medications as already ordered, these medications have been reviewed and there are no changes at this time.   4. Tobacco user Smoking cessation was discussed, 3-10 minutes spent on this topic specifically    Current Outpatient Medications on File Prior to Visit  Medication Sig Dispense Refill  . aspirin EC 81 MG tablet Take 1 tablet (81 mg total) by mouth daily. Swallow whole. 150 tablet 2  . atorvastatin (LIPITOR) 10 MG tablet Take 1 tablet (10 mg total) by mouth daily. 30 tablet 11  . clopidogrel (PLAVIX) 75 MG tablet Take 1 tablet (75 mg total) by mouth daily. 30 tablet 4  . hydrochlorothiazide (HYDRODIURIL) 25 MG tablet Take 25 mg by mouth daily.     Marland Kitchen HYDROcodone-acetaminophen (NORCO) 7.5-325 MG tablet Take 1-2 tablets by mouth every 6 (six) hours as needed for moderate pain or severe pain. 50 tablet 0  . metoprolol succinate (TOPROL-XL) 100 MG 24 hr tablet Take 100 mg by mouth daily.     . naproxen sodium (ALEVE) 220 MG tablet Take 440 mg by mouth daily.      . vitamin B-12 (CYANOCOBALAMIN) 1000 MCG tablet Take 1,000 mcg by mouth daily.    Marland Kitchen gabapentin (NEURONTIN) 300 MG capsule Take 1 capsule (300 mg total) by mouth at bedtime. (Patient not taking: Reported on 08/20/2020) 30 capsule 5   No current facility-administered medications on file prior to visit.    There are no Patient Instructions on file for this visit. No follow-ups on file.   Georgiana Spinner, NP

## 2020-09-26 ENCOUNTER — Ambulatory Visit (INDEPENDENT_AMBULATORY_CARE_PROVIDER_SITE_OTHER): Payer: 59 | Admitting: Vascular Surgery

## 2020-09-26 ENCOUNTER — Encounter (INDEPENDENT_AMBULATORY_CARE_PROVIDER_SITE_OTHER): Payer: 59

## 2020-09-30 DIAGNOSIS — Z0289 Encounter for other administrative examinations: Secondary | ICD-10-CM

## 2020-10-15 ENCOUNTER — Other Ambulatory Visit: Payer: Self-pay

## 2020-10-15 ENCOUNTER — Ambulatory Visit (INDEPENDENT_AMBULATORY_CARE_PROVIDER_SITE_OTHER): Payer: 59

## 2020-10-15 ENCOUNTER — Ambulatory Visit (INDEPENDENT_AMBULATORY_CARE_PROVIDER_SITE_OTHER): Payer: 59 | Admitting: Nurse Practitioner

## 2020-10-15 ENCOUNTER — Encounter (INDEPENDENT_AMBULATORY_CARE_PROVIDER_SITE_OTHER): Payer: Self-pay | Admitting: Nurse Practitioner

## 2020-10-15 VITALS — BP 118/74 | HR 99 | Ht 70.0 in | Wt 203.0 lb

## 2020-10-15 DIAGNOSIS — I70222 Atherosclerosis of native arteries of extremities with rest pain, left leg: Secondary | ICD-10-CM

## 2020-10-15 DIAGNOSIS — I70229 Atherosclerosis of native arteries of extremities with rest pain, unspecified extremity: Secondary | ICD-10-CM

## 2020-10-15 DIAGNOSIS — Z720 Tobacco use: Secondary | ICD-10-CM

## 2020-10-15 DIAGNOSIS — E1165 Type 2 diabetes mellitus with hyperglycemia: Secondary | ICD-10-CM

## 2020-10-15 DIAGNOSIS — I10 Essential (primary) hypertension: Secondary | ICD-10-CM

## 2020-10-16 ENCOUNTER — Encounter (INDEPENDENT_AMBULATORY_CARE_PROVIDER_SITE_OTHER): Payer: Self-pay

## 2020-10-18 ENCOUNTER — Telehealth (INDEPENDENT_AMBULATORY_CARE_PROVIDER_SITE_OTHER): Payer: Self-pay

## 2020-10-18 ENCOUNTER — Other Ambulatory Visit (INDEPENDENT_AMBULATORY_CARE_PROVIDER_SITE_OTHER): Payer: Self-pay | Admitting: Nurse Practitioner

## 2020-10-18 MED ORDER — GABAPENTIN 300 MG PO CAPS: 300.0000 mg | ORAL_CAPSULE | Freq: Every day | ORAL | 5 refills | Status: DC

## 2020-10-18 NOTE — Telephone Encounter (Signed)
Sent!

## 2020-10-18 NOTE — Telephone Encounter (Signed)
The pt left a VM on the nurses line wanting a refill on gabapentin. Please advise.

## 2020-10-20 ENCOUNTER — Encounter (INDEPENDENT_AMBULATORY_CARE_PROVIDER_SITE_OTHER): Payer: Self-pay | Admitting: Nurse Practitioner

## 2020-10-20 NOTE — Progress Notes (Signed)
Subjective:    Patient ID: Carl Norris, male    DOB: Jan 25, 1956, 64 y.o.   MRN: [id number] Chief Complaint  Patient presents with  . Follow-up    4wk U/S follow up    The patient returns to the office for followup and review of the noninvasive studies. There have been no interval changes in lower extremity symptoms. No interval shortening of the patient's claudication distance or development of rest pain symptoms. No new ulcers or wounds have occurred since the last visit.  The patient continues to have claudication within his right lower extremity and there is a small wound remaining from his femoral endarterectomy.  Less than 1 cm.  There have been no significant changes to the patient's overall health care.  The patient denies amaurosis fugax or recent TIA symptoms. There are no recent neurological changes noted. The patient denies history of DVT, PE or superficial thrombophlebitis. The patient denies recent episodes of angina or shortness of breath.   ABI Rt=0.70 and Lt=0.94  (previous ABI's Rt=0.70 and Lt=0.44) Duplex ultrasound of the right lower extremity reveals monophasic tibial artery waveforms with dampened toe waveforms.  The left lower extremity has triphasic tibial artery waveforms with strong toe waveforms.   Review of Systems  Skin: Positive for wound.       Objective:   Physical Exam Vitals reviewed.  HENT:     Head: Normocephalic.  Cardiovascular:     Rate and Rhythm: Normal rate.     Pulses:          Dorsalis pedis pulses are detected w/ Doppler on the right side and 1+ on the left side.       Posterior tibial pulses are detected w/ Doppler on the right side and 1+ on the left side.  Pulmonary:     Effort: Pulmonary effort is normal.  Skin:    General: Skin is warm and dry.  Neurological:     Mental Status: He is alert and oriented to person, place, and time.  Psychiatric:        Mood and Affect: Mood normal.        Behavior: Behavior normal.          Thought Content: Thought content normal.        Judgment: Judgment normal.     BP 118/74   Pulse 99   Ht 5\' 10"  (1.778 m)   Wt 203 lb (92.1 kg)   BMI 29.13 kg/m   Past Medical History:  Diagnosis Date  . Hypertension   . Peripheral vascular disease (HCC)     Social History   Socioeconomic History  . Marital status: Single    Spouse name: Not on file  . Number of children: 0  . Years of education: Not on file  . Highest education level: Not on file  Occupational History  . Not on file  Tobacco Use  . Smoking status: Current Every Day Smoker    Packs/day: 2.00    Years: 47.00    Pack years: 94.00    Types: Cigarettes  . Smokeless tobacco: Never Used  . Tobacco comment: SMOKED TODAY   Substance and Sexual Activity  . Alcohol use: Yes    Comment: socially  . Drug use: Never  . Sexual activity: Not on file  Other Topics Concern  . Not on file  Social History Narrative   Lives by himself    Social Determinants of Health   Financial Resource Strain:   . Difficulty  of Paying Living Expenses: Not on file  Food Insecurity:   . Worried About Programme researcher, broadcasting/film/video in the Last Year: Not on file  . Ran Out of Food in the Last Year: Not on file  Transportation Needs:   . Lack of Transportation (Medical): Not on file  . Lack of Transportation (Non-Medical): Not on file  Physical Activity:   . Days of Exercise per Week: Not on file  . Minutes of Exercise per Session: Not on file  Stress:   . Feeling of Stress : Not on file  Social Connections:   . Frequency of Communication with Friends and Family: Not on file  . Frequency of Social Gatherings with Friends and Family: Not on file  . Attends Religious Services: Not on file  . Active Member of Clubs or Organizations: Not on file  . Attends Banker Meetings: Not on file  . Marital Status: Not on file  Intimate Partner Violence:   . Fear of Current or Ex-Partner: Not on file  . Emotionally Abused: Not  on file  . Physically Abused: Not on file  . Sexually Abused: Not on file    Past Surgical History:  Procedure Laterality Date  . LOWER EXTREMITY ANGIOGRAPHY Left 08/27/2020   Procedure: LOWER EXTREMITY ANGIOGRAPHY;  Surgeon: Renford Dills, MD;  Location: [facility];  Service: Cardiovascular;  Laterality: Left;  . LOWER EXTREMITY ANGIOGRAPHY Left 09/03/2020   Procedure: LOWER EXTREMITY ANGIOGRAPHY;  Surgeon: Renford Dills, MD;  Location: [facility];  Service: Cardiovascular;  Laterality: Left;  . NECK SURGERY    . THROMBECTOMY FEMORAL ARTERY Left 09/04/2020   Procedure: THROMBECTOMY FEMORAL ARTERY possible stent;  Surgeon: Renford Dills, MD;  Location: [facility] OR[facility];  Service: Vascular;  Laterality: Left;    Family History  Problem Relation Age of Onset  . Cancer Father   . Vascular Disease Sister     Allergies  Allergen Reactions  . Oxycodone-Acetaminophen Itching    CBC Latest Ref Rng & Units 09/10/2020 09/09/2020 09/07/2020  WBC 4.0 - 10.5 K/uL 7.8 8.9 12.2(H)  Hemoglobin 13.0 - 17.0 g/dL 3.1(S) 9.7(W) 10.3(L)  Hematocrit 39 - 52 % 27.7(L) 27.9(L) 29.7(L)  Platelets 150 - 400 K/uL 295 259 212      CMP     Component Value Date/Time   NA 135 09/10/2020 0528   NA 141 03/16/2014 1656   K 4.0 09/10/2020 0528   K 4.0 03/16/2014 1656   CL 104 09/10/2020 0528   CL 106 03/16/2014 1656   CO2 26 09/10/2020 0528   CO2 29 03/16/2014 1656   GLUCOSE 123 (H) 09/10/2020 0528   GLUCOSE 79 03/16/2014 1656   BUN 11 09/10/2020 0528   BUN 16 03/16/2014 1656   CREATININE 0.82 09/10/2020 0528   CREATININE 0.97 03/16/2014 1656   CALCIUM 7.8 (L) 09/10/2020 0528   CALCIUM 8.9 03/16/2014 1656   PROT 5.8 (L) 09/09/2020 0541   PROT 7.6 03/16/2014 1656   ALBUMIN 2.4 (L) 09/09/2020 0541   ALBUMIN 3.7 03/16/2014 1656   AST 19 09/09/2020 0541   AST 25 03/16/2014 1656   ALT 16 09/09/2020 0541   ALT 43 03/16/2014 1656   ALKPHOS 32 (L) 09/09/2020 0541    ALKPHOS 68 03/16/2014 1656   BILITOT 0.6 09/09/2020 0541   BILITOT 0.3 03/16/2014 1656   GFRNONAA >60 09/10/2020 0528   GFRNONAA >60 03/16/2014 1656   GFRAA >60 03/16/2014 1656     VAS [place]  ABI WITH/WO TBI  Result Date: 10/17/2020 LOWER EXTREMITY DOPPLER STUDY Indications: Rest pain.  Vascular Interventions: 08/27/2020: Crosser Atherectomy of the Left SFA and                         Popliteal Artery. PTA and Stent placement of the Left                         SFA and Popliteal Artery. PTA of the Profunda Femoral                         Artery.                         09/03/2020 Left SFA thrombectomy with SFA and pop                         stents. Comparison Study: 09/02/2020 Performing Technologist: Reece Agar RT (R)(VS)  Examination Guidelines: A complete evaluation includes at minimum, Doppler waveform signals and systolic blood pressure reading at the level of bilateral brachial, anterior tibial, and posterior tibial arteries, when vessel segments are accessible. Bilateral testing is considered an integral part of a complete examination. Photoelectric Plethysmograph (PPG) waveforms and toe systolic pressure readings are included as required and additional duplex testing as needed. Limited examinations for reoccurring indications may be performed as noted.  ABI Findings: +---------+------------------+-----+----------+--------+ Right    Rt Pressure (mmHg)IndexWaveform  Comment  +---------+------------------+-----+----------+--------+ Brachial 130                                       +---------+------------------+-----+----------+--------+ ATA      69                0.53 monophasic         +---------+------------------+-----+----------+--------+ PTA      91                0.70 monophasic         +---------+------------------+-----+----------+--------+ Great Toe150               1.15 Abnormal           +---------+------------------+-----+----------+--------+  +---------+------------------+-----+---------+-------+ Left     Lt Pressure (mmHg)IndexWaveform Comment +---------+------------------+-----+---------+-------+ Brachial 129                                     +---------+------------------+-----+---------+-------+ ATA      113               0.87 triphasic        +---------+------------------+-----+---------+-------+ PTA      122               0.94 triphasic        +---------+------------------+-----+---------+-------+ Great Toe209               1.61 Normal           +---------+------------------+-----+---------+-------+ +-------+-----------+-----------+------------+------------+ ABI/TBIToday's ABIToday's TBIPrevious ABIPrevious TBI +-------+-----------+-----------+------------+------------+ Right  .70        1.15       .70         .39          +-------+-----------+-----------+------------+------------+ Left   .94  1.61       .44         0            +-------+-----------+-----------+------------+------------+ Right ABIs appear essentially unchanged compared to prior study on 09/02/2020. Left ABIs appear increased compared to prior study on 09/02/2020. Biateral TBI's appear increased as compared to the previous exam on 09/02/2020.  Summary: Right: Resting right ankle-brachial index indicates moderate right lower extremity arterial disease. The right toe-brachial index is normal. Although ankle brachial indices are within normal limits (0.95-1.29), arterial Doppler waveforms at the ankle suggest some component of arterial occlusive disease. Left: Resting left ankle-brachial index indicates mild left lower extremity arterial disease. The left toe-brachial index is normal.  *See table(s) above for measurements and observations.  Electronically signed by Levora Dredge MD on 10/17/2020 at 4:15:53 PM.    Final    VAS [place] ABI WITH/WO TBI  Result Date: 09/02/2020 LOWER EXTREMITY DOPPLER STUDY Indications: Rest pain.   Vascular Interventions: 08/27/2020: Crosser Atherectomy of the Left SFA and                         Popliteal Artery. PTA and Stent placement of the Left                         SFA and Popliteal Artery. PTA of the Profunda Femoral                         Artery. Performing Technologist: Debbe Bales RVS  Examination Guidelines: A complete evaluation includes at minimum, Doppler waveform signals and systolic blood pressure reading at the level of bilateral brachial, anterior tibial, and posterior tibial arteries, when vessel segments are accessible. Bilateral testing is considered an integral part of a complete examination. Photoelectric Plethysmograph (PPG) waveforms and toe systolic pressure readings are included as required and additional duplex testing as needed. Limited examinations for reoccurring indications may be performed as noted.  ABI Findings: +---------+------------------+-----+--------+--------+ Right    Rt Pressure (mmHg)IndexWaveformComment  +---------+------------------+-----+--------+--------+ Brachial 152                                     +---------+------------------+-----+--------+--------+ ATA      107               0.70                  +---------+------------------+-----+--------+--------+ PTA      101               0.66                  +---------+------------------+-----+--------+--------+ Great Toe59                0.39                  +---------+------------------+-----+--------+--------+ +---------+------------------+-----+--------+-------+ Left     Lt Pressure (mmHg)IndexWaveformComment +---------+------------------+-----+--------+-------+ Brachial 152                                    +---------+------------------+-----+--------+-------+ ATA      40                0.26                 +---------+------------------+-----+--------+-------+  PTA      67                0.44                  +---------+------------------+-----+--------+-------+ Great Toe0                 0.00                 +---------+------------------+-----+--------+-------+ +-------+-----------+-----------+------------+------------+ ABI/TBIToday's ABIToday's TBIPrevious ABIPrevious TBI +-------+-----------+-----------+------------+------------+ Right  .70        .39                                 +-------+-----------+-----------+------------+------------+ Left   .44        0.0                                 +-------+-----------+-----------+------------+------------+  Summary: Right: Resting right ankle-brachial index indicates moderate right lower extremity arterial disease. The right toe-brachial index is abnormal. Imaged and obtained Waveforms at the level of the Ankle. Left: Resting left ankle-brachial index indicates severe left lower extremity arterial disease. The left toe-brachial index is abnormal. Imaged and obtained Waveforms at the level of the Ankle. Imaged the CFA,SFA and Popliteal Artery as Well, No flow seen in the SFA and Popliteal Artery.  *See table(s) above for measurements and observations.  Electronically signed by Levora DredgeGregory Schnier MD on 09/02/2020 at 5:08:33 PM.    Final        Assessment & Plan:   1. Atherosclerosis of artery of extremity with rest pain [facility](HCC) Patient is recovering well post left femoral endarterectomy there is just a very small area that remains open.  Patient has been utilizing the ointment prescribed however has not been covering the area.  The patient is advised to use the ointment and use a Band-Aid over the area.  Currently the patient does not have any rest pain or any limb threatening symptoms of the right lower extremity.  However he does have significant claudication.  We will plan on having the patient return to the office in 3 months to repeat noninvasive studies intervention of the wound is fully healed before proceeding on right lower extremity  intervention.  However the patient is noted that if the pain becomes worse prior to follow-up please contact her office for sooner follow-up.  2. Type 2 diabetes mellitus with hyperglycemia, without long-term current use of insulin (HCC) Continue hypoglycemic medications as already ordered, these medications have been reviewed and there are no changes at this time.  Hgb A1C to be monitored as already arranged by primary service   3. Essential (primary) hypertension Continue antihypertensive medications as already ordered, these medications have been reviewed and there are no changes at this time.   4. Tobacco user Smoking cessation was discussed, 3-10 minutes spent on this topic specifically    Current Outpatient Medications on File Prior to Visit  Medication Sig Dispense Refill  . atorvastatin (LIPITOR) 10 MG tablet Take 1 tablet (10 mg total) by mouth daily. 30 tablet 11  . clopidogrel (PLAVIX) 75 MG tablet Take 1 tablet (75 mg total) by mouth daily. 30 tablet 4  . hydrochlorothiazide (HYDRODIURIL) 25 MG tablet Take 25 mg by mouth daily.     Marland Kitchen. HYDROcodone-acetaminophen (NORCO) 7.5-325 MG tablet Take 1-2 tablets by mouth every 6 (six) hours as  needed for moderate pain or severe pain. 50 tablet 0  . metoprolol succinate (TOPROL-XL) 100 MG 24 hr tablet Take 100 mg by mouth daily.     . pantoprazole (PROTONIX) 40 MG tablet Take by mouth.    Marland Kitchen amoxicillin-clavulanate (AUGMENTIN) 875-125 MG tablet Take 1 tablet by mouth 2 (two) times daily. (Patient not taking: Reported on 10/15/2020) 14 tablet 0  . aspirin EC 81 MG tablet Take 1 tablet (81 mg total) by mouth daily. Swallow whole. (Patient not taking: Reported on 10/15/2020) 150 tablet 2  . mupirocin ointment (BACTROBAN) 2 % Apply 1 application topically 2 (two) times daily. (Patient not taking: Reported on 10/15/2020) 22 g 0  . naproxen sodium (ALEVE) 220 MG tablet Take 440 mg by mouth daily.  (Patient not taking: Reported on 10/15/2020)      . vitamin B-12 (CYANOCOBALAMIN) 1000 MCG tablet Take 1,000 mcg by mouth daily. (Patient not taking: Reported on 10/15/2020)     No current facility-administered medications on file prior to visit.    There are no Patient Instructions on file for this visit. No follow-ups on file.   Georgiana Spinner, NP

## 2020-11-25 DIAGNOSIS — B079 Viral wart, unspecified: Secondary | ICD-10-CM | POA: Diagnosis not present

## 2020-11-25 DIAGNOSIS — D2272 Melanocytic nevi of left lower limb, including hip: Secondary | ICD-10-CM | POA: Diagnosis not present

## 2020-11-25 DIAGNOSIS — D2261 Melanocytic nevi of right upper limb, including shoulder: Secondary | ICD-10-CM | POA: Diagnosis not present

## 2020-11-25 DIAGNOSIS — D225 Melanocytic nevi of trunk: Secondary | ICD-10-CM | POA: Diagnosis not present

## 2020-11-25 DIAGNOSIS — D2262 Melanocytic nevi of left upper limb, including shoulder: Secondary | ICD-10-CM | POA: Diagnosis not present

## 2020-11-25 DIAGNOSIS — D485 Neoplasm of uncertain behavior of skin: Secondary | ICD-10-CM | POA: Diagnosis not present

## 2020-12-25 ENCOUNTER — Other Ambulatory Visit (INDEPENDENT_AMBULATORY_CARE_PROVIDER_SITE_OTHER): Payer: Self-pay | Admitting: Nurse Practitioner

## 2020-12-25 ENCOUNTER — Telehealth (INDEPENDENT_AMBULATORY_CARE_PROVIDER_SITE_OTHER): Payer: Self-pay

## 2020-12-25 MED ORDER — GABAPENTIN 600 MG PO TABS: 600.0000 mg | ORAL_TABLET | Freq: Every day | ORAL | 2 refills | Status: DC

## 2020-12-25 NOTE — Telephone Encounter (Signed)
I sent in a 600 mg capsule.  However, he should understand that Gabapentin isn't like oxycodone in that it isn't a pain reliever.  It is meant to help the numbness/stinging and dull the sensation.  It may never take the pain completely away.  We can discuss further at his upcoming follow up

## 2020-12-25 NOTE — Telephone Encounter (Signed)
Patient was made aware and verbalized understanding. 

## 2021-01-03 ENCOUNTER — Other Ambulatory Visit (INDEPENDENT_AMBULATORY_CARE_PROVIDER_SITE_OTHER): Payer: Self-pay | Admitting: Nurse Practitioner

## 2021-01-03 DIAGNOSIS — I70229 Atherosclerosis of native arteries of extremities with rest pain, unspecified extremity: Secondary | ICD-10-CM

## 2021-01-07 ENCOUNTER — Ambulatory Visit (INDEPENDENT_AMBULATORY_CARE_PROVIDER_SITE_OTHER): Payer: 59 | Admitting: Vascular Surgery

## 2021-01-07 ENCOUNTER — Encounter (INDEPENDENT_AMBULATORY_CARE_PROVIDER_SITE_OTHER): Payer: 59

## 2021-01-09 DIAGNOSIS — D649 Anemia, unspecified: Secondary | ICD-10-CM | POA: Diagnosis not present

## 2021-01-09 DIAGNOSIS — I10 Essential (primary) hypertension: Secondary | ICD-10-CM | POA: Diagnosis not present

## 2021-01-09 DIAGNOSIS — E1165 Type 2 diabetes mellitus with hyperglycemia: Secondary | ICD-10-CM | POA: Diagnosis not present

## 2021-01-09 DIAGNOSIS — Z0000 Encounter for general adult medical examination without abnormal findings: Secondary | ICD-10-CM | POA: Diagnosis not present

## 2021-01-23 DIAGNOSIS — I739 Peripheral vascular disease, unspecified: Secondary | ICD-10-CM | POA: Diagnosis not present

## 2021-01-23 DIAGNOSIS — E1165 Type 2 diabetes mellitus with hyperglycemia: Secondary | ICD-10-CM | POA: Diagnosis not present

## 2021-01-23 DIAGNOSIS — I10 Essential (primary) hypertension: Secondary | ICD-10-CM | POA: Diagnosis not present

## 2021-01-23 DIAGNOSIS — Z720 Tobacco use: Secondary | ICD-10-CM | POA: Diagnosis not present

## 2021-03-21 IMAGING — DX DG CHEST 1V
2 series · 2 of 2 positions shown · non-contrast
Comparison: None.

CLINICAL DATA: Cough

EXAM:
CHEST  1 VIEW

[chest ap (1 of 2)]
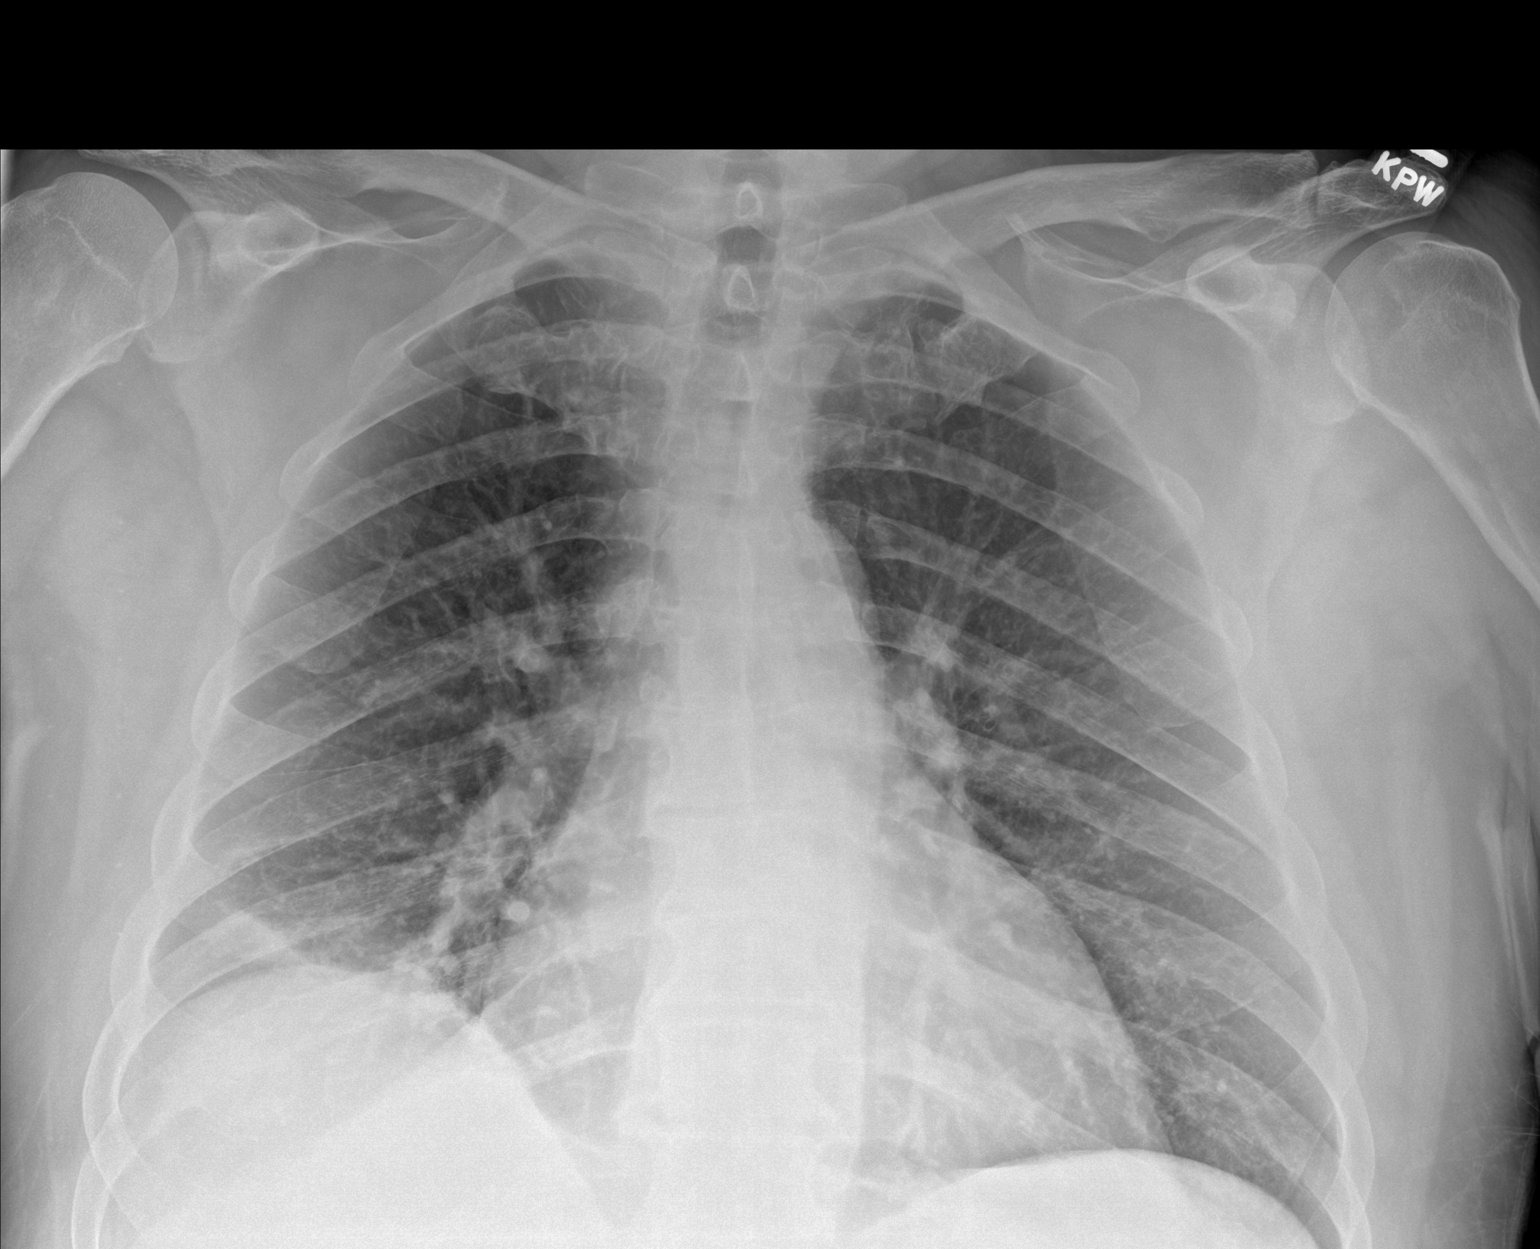

[chest ap (2 of 2)]
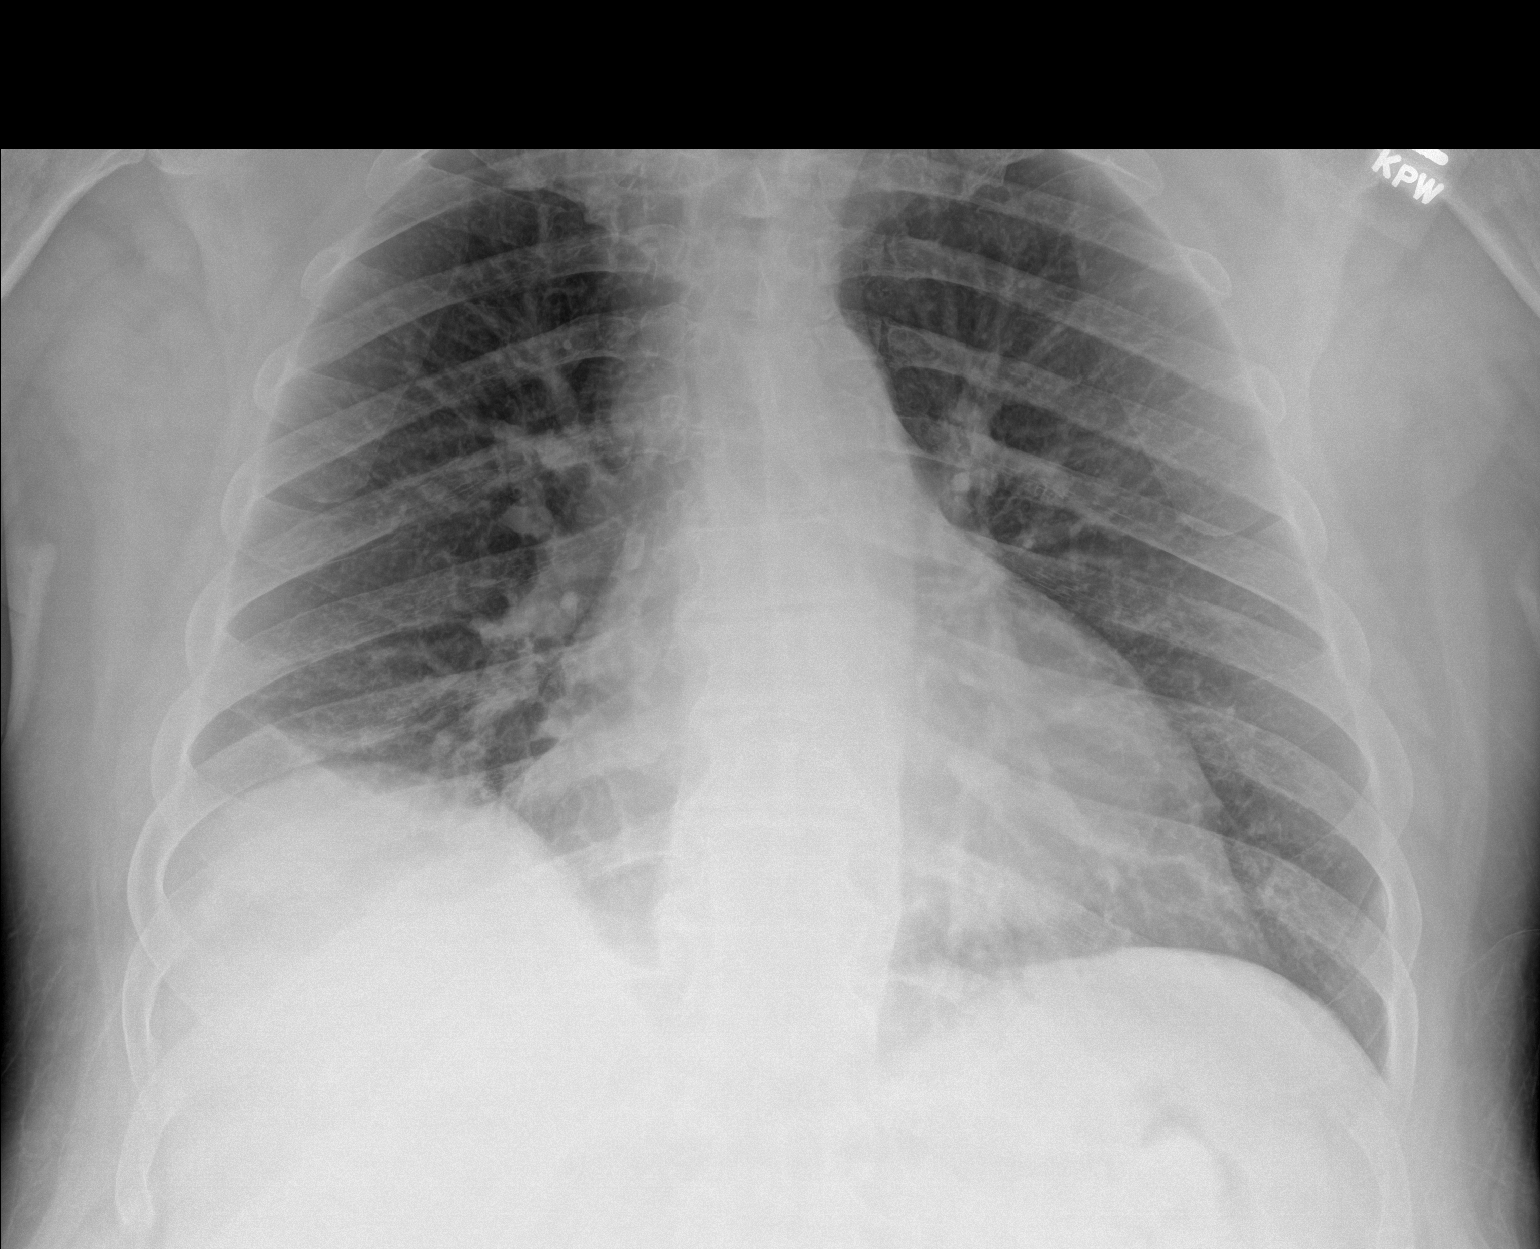

[2 of 2 positions shown; findings below may reference images not displayed]

FINDINGS: There is mild elevation of the right hemidiaphragm and associated
right basilar atelectasis. The lungs are otherwise clear. No
pneumothorax or pleural effusion. Cardiac size within normal limits.
No acute bone abnormality.
IMPRESSION: No active disease.

## 2021-04-13 ENCOUNTER — Other Ambulatory Visit (INDEPENDENT_AMBULATORY_CARE_PROVIDER_SITE_OTHER): Payer: Self-pay | Admitting: Nurse Practitioner

## 2021-04-16 NOTE — Telephone Encounter (Signed)
Is this ok to fill? 

## 2021-06-25 DIAGNOSIS — J029 Acute pharyngitis, unspecified: Secondary | ICD-10-CM | POA: Diagnosis not present

## 2021-06-25 DIAGNOSIS — R112 Nausea with vomiting, unspecified: Secondary | ICD-10-CM | POA: Diagnosis not present

## 2021-06-25 DIAGNOSIS — R42 Dizziness and giddiness: Secondary | ICD-10-CM | POA: Diagnosis not present

## 2021-06-25 DIAGNOSIS — R197 Diarrhea, unspecified: Secondary | ICD-10-CM | POA: Diagnosis not present

## 2021-07-03 DIAGNOSIS — M2550 Pain in unspecified joint: Secondary | ICD-10-CM | POA: Diagnosis not present

## 2021-07-03 DIAGNOSIS — E1165 Type 2 diabetes mellitus with hyperglycemia: Secondary | ICD-10-CM | POA: Diagnosis not present

## 2021-07-03 DIAGNOSIS — Z114 Encounter for screening for human immunodeficiency virus [HIV]: Secondary | ICD-10-CM | POA: Diagnosis not present

## 2021-07-03 DIAGNOSIS — R197 Diarrhea, unspecified: Secondary | ICD-10-CM | POA: Diagnosis not present

## 2021-07-03 DIAGNOSIS — R112 Nausea with vomiting, unspecified: Secondary | ICD-10-CM | POA: Diagnosis not present

## 2021-07-03 DIAGNOSIS — U071 COVID-19: Secondary | ICD-10-CM | POA: Diagnosis not present

## 2021-07-04 DIAGNOSIS — R638 Other symptoms and signs concerning food and fluid intake: Secondary | ICD-10-CM | POA: Diagnosis not present

## 2021-07-10 ENCOUNTER — Telehealth (INDEPENDENT_AMBULATORY_CARE_PROVIDER_SITE_OTHER): Payer: Self-pay

## 2021-07-10 NOTE — Telephone Encounter (Signed)
Bring him in for abis and L DVT study

## 2021-07-10 NOTE — Telephone Encounter (Signed)
Called stating that his symptoms have worsened (cold left foot)Provider states to bring him in tomorrow with just abi studies and have patient evaluated. Patient agreed. I aslo advised patient if things worsen before tomorrow he is to go to the ED per provider.   This note is for documentation purposes only.

## 2021-07-11 ENCOUNTER — Encounter (INDEPENDENT_AMBULATORY_CARE_PROVIDER_SITE_OTHER): Payer: Self-pay | Admitting: Nurse Practitioner

## 2021-07-11 ENCOUNTER — Ambulatory Visit (INDEPENDENT_AMBULATORY_CARE_PROVIDER_SITE_OTHER): Payer: BC Managed Care – PPO | Admitting: Nurse Practitioner

## 2021-07-11 ENCOUNTER — Encounter (INDEPENDENT_AMBULATORY_CARE_PROVIDER_SITE_OTHER): Payer: Self-pay

## 2021-07-11 ENCOUNTER — Ambulatory Visit (INDEPENDENT_AMBULATORY_CARE_PROVIDER_SITE_OTHER): Payer: BC Managed Care – PPO

## 2021-07-11 ENCOUNTER — Encounter (INDEPENDENT_AMBULATORY_CARE_PROVIDER_SITE_OTHER): Payer: BC Managed Care – PPO

## 2021-07-11 ENCOUNTER — Other Ambulatory Visit (INDEPENDENT_AMBULATORY_CARE_PROVIDER_SITE_OTHER): Payer: Self-pay | Admitting: Nurse Practitioner

## 2021-07-11 ENCOUNTER — Other Ambulatory Visit: Payer: Self-pay

## 2021-07-11 VITALS — BP 112/72 | HR 84 | Resp 16 | Wt 203.0 lb

## 2021-07-11 DIAGNOSIS — R208 Other disturbances of skin sensation: Secondary | ICD-10-CM | POA: Diagnosis not present

## 2021-07-11 DIAGNOSIS — R209 Unspecified disturbances of skin sensation: Secondary | ICD-10-CM

## 2021-07-11 DIAGNOSIS — I10 Essential (primary) hypertension: Secondary | ICD-10-CM | POA: Diagnosis not present

## 2021-07-11 DIAGNOSIS — R200 Anesthesia of skin: Secondary | ICD-10-CM

## 2021-07-11 DIAGNOSIS — I70222 Atherosclerosis of native arteries of extremities with rest pain, left leg: Secondary | ICD-10-CM | POA: Diagnosis not present

## 2021-07-11 DIAGNOSIS — Z95828 Presence of other vascular implants and grafts: Secondary | ICD-10-CM

## 2021-07-11 DIAGNOSIS — Z720 Tobacco use: Secondary | ICD-10-CM

## 2021-07-11 DIAGNOSIS — E1165 Type 2 diabetes mellitus with hyperglycemia: Secondary | ICD-10-CM | POA: Diagnosis not present

## 2021-07-12 ENCOUNTER — Encounter (INDEPENDENT_AMBULATORY_CARE_PROVIDER_SITE_OTHER): Payer: Self-pay | Admitting: Nurse Practitioner

## 2021-07-12 NOTE — Progress Notes (Signed)
Subjective:    Patient ID: Carl Norris, male    DOB: 06/16/1956, 65 y.o.   MRN: [id number] Chief Complaint  Patient presents with   Follow-up    Left leg numbness     Carl Norris is a 65 year old male that presents today due to numbness in his left lower extremity.  The patient notes that this began approximately a day or so ago.  The patient has had multiple vascular interventions on his left lower extremity.  Including the femoral endarterectomy not quite 1 year ago.  The patient also reports that he has not been taking his Plavix or aspirin.  The patient recently had a bout with COVID-19 approximately 3 weeks ago as well.  He denies pain in his leg however it is numb and has recently begun to feel cold.  He denies any open wounds or ulcerations.  Today his SFA is occluded from the proximal portion down to the mid popliteal.  There is dampened flow in the tibial arteries.   Review of Systems  Skin:  Positive for pallor.  Neurological:  Positive for numbness.  All other systems reviewed and are negative.     Objective:   Physical Exam Vitals reviewed.  HENT:     Head: Normocephalic.  Cardiovascular:     Rate and Rhythm: Normal rate.     Pulses:          Dorsalis pedis pulses are 0 on the left side.       Posterior tibial pulses are 0 on the left side.  Pulmonary:     Effort: Pulmonary effort is normal.  Skin:    General: Skin is cool.  Neurological:     Mental Status: He is alert and oriented to person, place, and time.  Psychiatric:        Mood and Affect: Mood normal.        Behavior: Behavior normal.        Thought Content: Thought content normal.        Judgment: Judgment normal.    BP 112/72 (BP Location: Right Arm)   Pulse 84   Resp 16   Wt 203 lb (92.1 kg)   BMI 29.13 kg/m   Past Medical History:  Diagnosis Date   Hypertension    Peripheral vascular disease (HCC)     Social History   Socioeconomic History   Marital status: Single    Spouse  name: Not on file   Number of children: 0   Years of education: Not on file   Highest education level: Not on file  Occupational History   Not on file  Tobacco Use   Smoking status: Every Day    Packs/day: 2.00    Years: 47.00    Pack years: 94.00    Types: Cigarettes   Smokeless tobacco: Never   Tobacco comments:    SMOKED TODAY   Substance and Sexual Activity   Alcohol use: Yes    Comment: socially   Drug use: Never   Sexual activity: Not on file  Other Topics Concern   Not on file  Social History Narrative   Lives by himself    Social Determinants of Health   Financial Resource Strain: Not on file  Food Insecurity: Not on file  Transportation Needs: Not on file  Physical Activity: Not on file  Stress: Not on file  Social Connections: Not on file  Intimate Partner Violence: Not on file    Past Surgical History:  Procedure  Laterality Date   LOWER EXTREMITY ANGIOGRAPHY Left 08/27/2020   Procedure: LOWER EXTREMITY ANGIOGRAPHY;  Surgeon: Renford Dills, MD;  Location: [facility];  Service: Cardiovascular;  Laterality: Left;   LOWER EXTREMITY ANGIOGRAPHY Left 09/03/2020   Procedure: LOWER EXTREMITY ANGIOGRAPHY;  Surgeon: Renford Dills, MD;  Location: [facility];  Service: Cardiovascular;  Laterality: Left;   NECK SURGERY     THROMBECTOMY FEMORAL ARTERY Left 09/04/2020   Procedure: THROMBECTOMY FEMORAL ARTERY possible stent;  Surgeon: Renford Dills, MD;  Location: [facility];  Service: Vascular;  Laterality: Left;    Family History  Problem Relation Age of Onset   Cancer Father    Vascular Disease Sister     Allergies  Allergen Reactions   Oxycodone-Acetaminophen Itching    CBC Latest Ref Rng & Units 09/10/2020 09/09/2020 09/07/2020  WBC 4.0 - 10.5 K/uL 7.8 8.9 12.2(H)  Hemoglobin 13.0 - 17.0 g/dL 7.8(G) 9.5(A) 10.3(L)  Hematocrit 39.0 - 52.0 % 27.7(L) 27.9(L) 29.7(L)  Platelets 150 - 400 K/uL 295 259 212      CMP      Component Value Date/Time   NA 135 09/10/2020 0528   NA 141 03/16/2014 1656   K 4.0 09/10/2020 0528   K 4.0 03/16/2014 1656   CL 104 09/10/2020 0528   CL 106 03/16/2014 1656   CO2 26 09/10/2020 0528   CO2 29 03/16/2014 1656   GLUCOSE 123 (H) 09/10/2020 0528   GLUCOSE 79 03/16/2014 1656   BUN 11 09/10/2020 0528   BUN 16 03/16/2014 1656   CREATININE 0.82 09/10/2020 0528   CREATININE 0.97 03/16/2014 1656   CALCIUM 7.8 (L) 09/10/2020 0528   CALCIUM 8.9 03/16/2014 1656   PROT 5.8 (L) 09/09/2020 0541   PROT 7.6 03/16/2014 1656   ALBUMIN 2.4 (L) 09/09/2020 0541   ALBUMIN 3.7 03/16/2014 1656   AST 19 09/09/2020 0541   AST 25 03/16/2014 1656   ALT 16 09/09/2020 0541   ALT 43 03/16/2014 1656   ALKPHOS 32 (L) 09/09/2020 0541   ALKPHOS 68 03/16/2014 1656   BILITOT 0.6 09/09/2020 0541   BILITOT 0.3 03/16/2014 1656   GFRNONAA >60 09/10/2020 0528   GFRNONAA >60 03/16/2014 1656   GFRAA >60 03/16/2014 1656     No results found.     Assessment & Plan:   1. Atherosclerosis of native artery of left lower extremity with rest pain (HCC) Recommend:  The patient has evidence of severe atherosclerotic changes of both lower extremities with rest pain that is associated with preulcerative changes and impending tissue loss of the foot.  This represents a limb threatening ischemia and places the patient at the risk for limb loss.  Patient should undergo angiography of the lower extremities with the hope for intervention for limb salvage.  The risks and benefits as well as the alternative therapies was discussed in detail with the patient.  All questions were answered.  Patient agrees to proceed with angiography.  The patient was given Eliquis and advised to be able to begin taking 2 tablets twice a day until the day of his procedure.  He should hold on the morning of.  The patient is also advised that if his foot becomes discolored or if he begins experiencing pain that he should present to the  emergency room over the weekend.  The patient will follow up with me in the office after the procedure.       2. Tobacco user Smoking cessation was discussed,  3-10 minutes spent on this topic specifically   3. Type 2 diabetes mellitus with hyperglycemia, without long-term current use of insulin (HCC) Continue hypoglycemic medications as already ordered, these medications have been reviewed and there are no changes at this time.  Hgb A1C to be monitored as already arranged by primary service   4. Essential (primary) hypertension Continue antihypertensive medications as already ordered, these medications have been reviewed and there are no changes at this time.    Current Outpatient Medications on File Prior to Visit  Medication Sig Dispense Refill   atorvastatin (LIPITOR) 10 MG tablet Take 1 tablet (10 mg total) by mouth daily. 30 tablet 11   clopidogrel (PLAVIX) 75 MG tablet Take 1 tablet (75 mg total) by mouth daily. 30 tablet 4   gabapentin (NEURONTIN) 600 MG tablet TAKE 1 TABLET BY MOUTH AT BEDTIME. 30 tablet 2   hydrochlorothiazide (HYDRODIURIL) 25 MG tablet Take 25 mg by mouth daily.      metoprolol succinate (TOPROL-XL) 100 MG 24 hr tablet Take 100 mg by mouth daily.      sitaGLIPtin (JANUVIA) 100 MG tablet Take 100 mg by mouth daily.     amoxicillin-clavulanate (AUGMENTIN) 875-125 MG tablet Take 1 tablet by mouth 2 (two) times daily. (Patient not taking: No sig reported) 14 tablet 0   aspirin EC 81 MG tablet Take 1 tablet (81 mg total) by mouth daily. Swallow whole. (Patient not taking: No sig reported) 150 tablet 2   HYDROcodone-acetaminophen (NORCO) 7.5-325 MG tablet Take 1-2 tablets by mouth every 6 (six) hours as needed for moderate pain or severe pain. (Patient not taking: Reported on 07/11/2021) 50 tablet 0   mupirocin ointment (BACTROBAN) 2 % Apply 1 application topically 2 (two) times daily. (Patient not taking: No sig reported) 22 g 0   naproxen sodium (ALEVE) 220 MG  tablet Take 440 mg by mouth daily.  (Patient not taking: No sig reported)     pantoprazole (PROTONIX) 40 MG tablet Take by mouth. (Patient not taking: Reported on 07/11/2021)     vitamin B-12 (CYANOCOBALAMIN) 1000 MCG tablet Take 1,000 mcg by mouth daily. (Patient not taking: No sig reported)     No current facility-administered medications on file prior to visit.    There are no Patient Instructions on file for this visit. No follow-ups on file.   Georgiana Spinner, NP

## 2021-07-12 NOTE — H&P (View-Only) (Signed)
Subjective:    Patient ID: Carl Norris, male    DOB: 06/16/1956, 65 y.o.   MRN: [id number] Chief Complaint  Patient presents with   Follow-up    Left leg numbness     Carl Norris is a 65 year old male that presents today due to numbness in his left lower extremity.  The patient notes that this began approximately a day or so ago.  The patient has had multiple vascular interventions on his left lower extremity.  Including the femoral endarterectomy not quite 1 year ago.  The patient also reports that he has not been taking his Plavix or aspirin.  The patient recently had a bout with COVID-19 approximately 3 weeks ago as well.  He denies pain in his leg however it is numb and has recently begun to feel cold.  He denies any open wounds or ulcerations.  Today his SFA is occluded from the proximal portion down to the mid popliteal.  There is dampened flow in the tibial arteries.   Review of Systems  Skin:  Positive for pallor.  Neurological:  Positive for numbness.  All other systems reviewed and are negative.     Objective:   Physical Exam Vitals reviewed.  HENT:     Head: Normocephalic.  Cardiovascular:     Rate and Rhythm: Normal rate.     Pulses:          Dorsalis pedis pulses are 0 on the left side.       Posterior tibial pulses are 0 on the left side.  Pulmonary:     Effort: Pulmonary effort is normal.  Skin:    General: Skin is cool.  Neurological:     Mental Status: He is alert and oriented to person, place, and time.  Psychiatric:        Mood and Affect: Mood normal.        Behavior: Behavior normal.        Thought Content: Thought content normal.        Judgment: Judgment normal.    BP 112/72 (BP Location: Right Arm)   Pulse 84   Resp 16   Wt 203 lb (92.1 kg)   BMI 29.13 kg/m   Past Medical History:  Diagnosis Date   Hypertension    Peripheral vascular disease (HCC)     Social History   Socioeconomic History   Marital status: Single    Spouse  name: Not on file   Number of children: 0   Years of education: Not on file   Highest education level: Not on file  Occupational History   Not on file  Tobacco Use   Smoking status: Every Day    Packs/day: 2.00    Years: 47.00    Pack years: 94.00    Types: Cigarettes   Smokeless tobacco: Never   Tobacco comments:    SMOKED TODAY   Substance and Sexual Activity   Alcohol use: Yes    Comment: socially   Drug use: Never   Sexual activity: Not on file  Other Topics Concern   Not on file  Social History Narrative   Lives by himself    Social Determinants of Health   Financial Resource Strain: Not on file  Food Insecurity: Not on file  Transportation Needs: Not on file  Physical Activity: Not on file  Stress: Not on file  Social Connections: Not on file  Intimate Partner Violence: Not on file    Past Surgical History:  Procedure  Laterality Date   LOWER EXTREMITY ANGIOGRAPHY Left 08/27/2020   Procedure: LOWER EXTREMITY ANGIOGRAPHY;  Surgeon: Renford Dills, MD;  Location: [facility];  Service: Cardiovascular;  Laterality: Left;   LOWER EXTREMITY ANGIOGRAPHY Left 09/03/2020   Procedure: LOWER EXTREMITY ANGIOGRAPHY;  Surgeon: Renford Dills, MD;  Location: [facility];  Service: Cardiovascular;  Laterality: Left;   NECK SURGERY     THROMBECTOMY FEMORAL ARTERY Left 09/04/2020   Procedure: THROMBECTOMY FEMORAL ARTERY possible stent;  Surgeon: Renford Dills, MD;  Location: [facility];  Service: Vascular;  Laterality: Left;    Family History  Problem Relation Age of Onset   Cancer Father    Vascular Disease Sister     Allergies  Allergen Reactions   Oxycodone-Acetaminophen Itching    CBC Latest Ref Rng & Units 09/10/2020 09/09/2020 09/07/2020  WBC 4.0 - 10.5 K/uL 7.8 8.9 12.2(H)  Hemoglobin 13.0 - 17.0 g/dL 7.8(G) 9.5(A) 10.3(L)  Hematocrit 39.0 - 52.0 % 27.7(L) 27.9(L) 29.7(L)  Platelets 150 - 400 K/uL 295 259 212      CMP      Component Value Date/Time   NA 135 09/10/2020 0528   NA 141 03/16/2014 1656   K 4.0 09/10/2020 0528   K 4.0 03/16/2014 1656   CL 104 09/10/2020 0528   CL 106 03/16/2014 1656   CO2 26 09/10/2020 0528   CO2 29 03/16/2014 1656   GLUCOSE 123 (H) 09/10/2020 0528   GLUCOSE 79 03/16/2014 1656   BUN 11 09/10/2020 0528   BUN 16 03/16/2014 1656   CREATININE 0.82 09/10/2020 0528   CREATININE 0.97 03/16/2014 1656   CALCIUM 7.8 (L) 09/10/2020 0528   CALCIUM 8.9 03/16/2014 1656   PROT 5.8 (L) 09/09/2020 0541   PROT 7.6 03/16/2014 1656   ALBUMIN 2.4 (L) 09/09/2020 0541   ALBUMIN 3.7 03/16/2014 1656   AST 19 09/09/2020 0541   AST 25 03/16/2014 1656   ALT 16 09/09/2020 0541   ALT 43 03/16/2014 1656   ALKPHOS 32 (L) 09/09/2020 0541   ALKPHOS 68 03/16/2014 1656   BILITOT 0.6 09/09/2020 0541   BILITOT 0.3 03/16/2014 1656   GFRNONAA >60 09/10/2020 0528   GFRNONAA >60 03/16/2014 1656   GFRAA >60 03/16/2014 1656     No results found.     Assessment & Plan:   1. Atherosclerosis of native artery of left lower extremity with rest pain (HCC) Recommend:  The patient has evidence of severe atherosclerotic changes of both lower extremities with rest pain that is associated with preulcerative changes and impending tissue loss of the foot.  This represents a limb threatening ischemia and places the patient at the risk for limb loss.  Patient should undergo angiography of the lower extremities with the hope for intervention for limb salvage.  The risks and benefits as well as the alternative therapies was discussed in detail with the patient.  All questions were answered.  Patient agrees to proceed with angiography.  The patient was given Eliquis and advised to be able to begin taking 2 tablets twice a day until the day of his procedure.  He should hold on the morning of.  The patient is also advised that if his foot becomes discolored or if he begins experiencing pain that he should present to the  emergency room over the weekend.  The patient will follow up with me in the office after the procedure.       2. Tobacco user Smoking cessation was discussed,  3-10 minutes spent on this topic specifically   3. Type 2 diabetes mellitus with hyperglycemia, without long-term current use of insulin (HCC) Continue hypoglycemic medications as already ordered, these medications have been reviewed and there are no changes at this time.  Hgb A1C to be monitored as already arranged by primary service   4. Essential (primary) hypertension Continue antihypertensive medications as already ordered, these medications have been reviewed and there are no changes at this time.    Current Outpatient Medications on File Prior to Visit  Medication Sig Dispense Refill   atorvastatin (LIPITOR) 10 MG tablet Take 1 tablet (10 mg total) by mouth daily. 30 tablet 11   clopidogrel (PLAVIX) 75 MG tablet Take 1 tablet (75 mg total) by mouth daily. 30 tablet 4   gabapentin (NEURONTIN) 600 MG tablet TAKE 1 TABLET BY MOUTH AT BEDTIME. 30 tablet 2   hydrochlorothiazide (HYDRODIURIL) 25 MG tablet Take 25 mg by mouth daily.      metoprolol succinate (TOPROL-XL) 100 MG 24 hr tablet Take 100 mg by mouth daily.      sitaGLIPtin (JANUVIA) 100 MG tablet Take 100 mg by mouth daily.     amoxicillin-clavulanate (AUGMENTIN) 875-125 MG tablet Take 1 tablet by mouth 2 (two) times daily. (Patient not taking: No sig reported) 14 tablet 0   aspirin EC 81 MG tablet Take 1 tablet (81 mg total) by mouth daily. Swallow whole. (Patient not taking: No sig reported) 150 tablet 2   HYDROcodone-acetaminophen (NORCO) 7.5-325 MG tablet Take 1-2 tablets by mouth every 6 (six) hours as needed for moderate pain or severe pain. (Patient not taking: Reported on 07/11/2021) 50 tablet 0   mupirocin ointment (BACTROBAN) 2 % Apply 1 application topically 2 (two) times daily. (Patient not taking: No sig reported) 22 g 0   naproxen sodium (ALEVE) 220 MG  tablet Take 440 mg by mouth daily.  (Patient not taking: No sig reported)     pantoprazole (PROTONIX) 40 MG tablet Take by mouth. (Patient not taking: Reported on 07/11/2021)     vitamin B-12 (CYANOCOBALAMIN) 1000 MCG tablet Take 1,000 mcg by mouth daily. (Patient not taking: No sig reported)     No current facility-administered medications on file prior to visit.    There are no Patient Instructions on file for this visit. No follow-ups on file.   Georgiana Spinner, NP

## 2021-07-14 ENCOUNTER — Other Ambulatory Visit (INDEPENDENT_AMBULATORY_CARE_PROVIDER_SITE_OTHER): Payer: Self-pay | Admitting: Nurse Practitioner

## 2021-07-14 ENCOUNTER — Observation Stay
Admission: RE | Admit: 2021-07-14 | Discharge: 2021-07-16 | Disposition: A | Discharge status: Home / Self Care | Payer: BC Managed Care – PPO | Attending: Vascular Surgery | Admitting: Vascular Surgery

## 2021-07-14 ENCOUNTER — Encounter: Payer: Self-pay | Admitting: Vascular Surgery

## 2021-07-14 ENCOUNTER — Encounter (INDEPENDENT_AMBULATORY_CARE_PROVIDER_SITE_OTHER): Payer: BC Managed Care – PPO

## 2021-07-14 ENCOUNTER — Other Ambulatory Visit: Payer: Self-pay

## 2021-07-14 ENCOUNTER — Other Ambulatory Visit (INDEPENDENT_AMBULATORY_CARE_PROVIDER_SITE_OTHER): Payer: Self-pay | Admitting: Vascular Surgery

## 2021-07-14 ENCOUNTER — Encounter
Admission: RE | Disposition: A | Discharge status: Home / Self Care | Payer: Self-pay | Source: Home / Self Care | Attending: Vascular Surgery

## 2021-07-14 ENCOUNTER — Ambulatory Visit (INDEPENDENT_AMBULATORY_CARE_PROVIDER_SITE_OTHER): Payer: BC Managed Care – PPO | Admitting: Vascular Surgery

## 2021-07-14 DIAGNOSIS — Z885 Allergy status to narcotic agent status: Secondary | ICD-10-CM | POA: Insufficient documentation

## 2021-07-14 DIAGNOSIS — I70229 Atherosclerosis of native arteries of extremities with rest pain, unspecified extremity: Secondary | ICD-10-CM | POA: Diagnosis present

## 2021-07-14 DIAGNOSIS — Z79899 Other long term (current) drug therapy: Secondary | ICD-10-CM | POA: Diagnosis not present

## 2021-07-14 DIAGNOSIS — Z7982 Long term (current) use of aspirin: Secondary | ICD-10-CM | POA: Insufficient documentation

## 2021-07-14 DIAGNOSIS — I70223 Atherosclerosis of native arteries of extremities with rest pain, bilateral legs: Secondary | ICD-10-CM | POA: Diagnosis not present

## 2021-07-14 DIAGNOSIS — F17210 Nicotine dependence, cigarettes, uncomplicated: Secondary | ICD-10-CM | POA: Diagnosis not present

## 2021-07-14 DIAGNOSIS — I10 Essential (primary) hypertension: Secondary | ICD-10-CM | POA: Insufficient documentation

## 2021-07-14 DIAGNOSIS — E1151 Type 2 diabetes mellitus with diabetic peripheral angiopathy without gangrene: Secondary | ICD-10-CM | POA: Diagnosis not present

## 2021-07-14 DIAGNOSIS — Z8616 Personal history of COVID-19: Secondary | ICD-10-CM | POA: Diagnosis not present

## 2021-07-14 DIAGNOSIS — Z794 Long term (current) use of insulin: Secondary | ICD-10-CM | POA: Insufficient documentation

## 2021-07-14 DIAGNOSIS — Z7902 Long term (current) use of antithrombotics/antiplatelets: Secondary | ICD-10-CM | POA: Insufficient documentation

## 2021-07-14 DIAGNOSIS — I82432 Acute embolism and thrombosis of left popliteal vein: Secondary | ICD-10-CM | POA: Diagnosis not present

## 2021-07-14 DIAGNOSIS — E1165 Type 2 diabetes mellitus with hyperglycemia: Secondary | ICD-10-CM | POA: Diagnosis not present

## 2021-07-14 DIAGNOSIS — I998 Other disorder of circulatory system: Secondary | ICD-10-CM | POA: Diagnosis present

## 2021-07-14 DIAGNOSIS — I70222 Atherosclerosis of native arteries of extremities with rest pain, left leg: Secondary | ICD-10-CM | POA: Diagnosis not present

## 2021-07-14 HISTORY — DX: Type 2 diabetes mellitus without complications: E11.9

## 2021-07-14 HISTORY — PX: LOWER EXTREMITY ANGIOGRAPHY: CATH118251

## 2021-07-14 LAB — MRSA NEXT GEN BY PCR, NASAL: MRSA by PCR Next Gen: NOT DETECTED

## 2021-07-14 LAB — FIBRINOGEN: Fibrinogen: 564 mg/dL — ABNORMAL HIGH (ref 210–475)

## 2021-07-14 LAB — GLUCOSE, CAPILLARY: Glucose-Capillary: 105 mg/dL — ABNORMAL HIGH (ref 70–99)

## 2021-07-14 LAB — CBC
HCT: 38.1 % — ABNORMAL LOW (ref 39.0–52.0)
Hemoglobin: 13.9 g/dL (ref 13.0–17.0)
MCH: 34.7 pg — ABNORMAL HIGH (ref 26.0–34.0)
MCHC: 36.5 g/dL — ABNORMAL HIGH (ref 30.0–36.0)
MCV: 95 fL (ref 80.0–100.0)
Platelets: 168 10*3/uL (ref 150–400)
RBC: 4.01 MIL/uL — ABNORMAL LOW (ref 4.22–5.81)
RDW: 13 % (ref 11.5–15.5)
WBC: 7.5 10*3/uL (ref 4.0–10.5)
nRBC: 0 % (ref 0.0–0.2)

## 2021-07-14 LAB — APTT: aPTT: 40 seconds — ABNORMAL HIGH (ref 24–36)

## 2021-07-14 LAB — HIV ANTIBODY (ROUTINE TESTING W REFLEX): HIV Screen 4th Generation wRfx: NONREACTIVE

## 2021-07-14 LAB — PROTIME-INR
INR: 1.4 — ABNORMAL HIGH (ref 0.8–1.2)
Prothrombin Time: 16.9 seconds — ABNORMAL HIGH (ref 11.4–15.2)

## 2021-07-14 SURGERY — LOWER EXTREMITY ANGIOGRAPHY
Anesthesia: Moderate Sedation | Site: Leg Lower | Laterality: Left

## 2021-07-14 MED ORDER — MIDAZOLAM HCL 2 MG/2ML IJ SOLN
INTRAMUSCULAR | Status: AC
  Filled 2021-07-14: qty 2

## 2021-07-14 MED ORDER — SODIUM CHLORIDE 0.9 % IV SOLN: 0.5000 mg/h | INTRAVENOUS | Status: DC

## 2021-07-14 MED ORDER — FAMOTIDINE 20 MG PO TABS: 40.0000 mg | ORAL_TABLET | Freq: Once | ORAL | Status: DC | PRN

## 2021-07-14 MED ORDER — SODIUM CHLORIDE 0.9% FLUSH: 3.0000 mL | INTRAVENOUS | Status: DC | PRN

## 2021-07-14 MED ORDER — MIDAZOLAM HCL 2 MG/ML PO SYRP: 8.0000 mg | ORAL_SOLUTION | Freq: Once | ORAL | Status: DC | PRN

## 2021-07-14 MED ORDER — HEPARIN (PORCINE) 25000 UT/250ML-% IV SOLN
INTRAVENOUS | Status: AC
  Administered 2021-07-14: 600 [IU]/h via INTRAVENOUS
  Filled 2021-07-14: qty 250

## 2021-07-14 MED ORDER — ACETAMINOPHEN 325 MG PO TABS
650.0000 mg | ORAL_TABLET | Freq: Four times a day (QID) | ORAL | Status: DC | PRN
  Administered 2021-07-15: 650 mg via ORAL
  Filled 2021-07-14: qty 2

## 2021-07-14 MED ORDER — FENTANYL CITRATE PF 50 MCG/ML IJ SOSY: 12.5000 ug | PREFILLED_SYRINGE | Freq: Once | INTRAMUSCULAR | Status: DC | PRN

## 2021-07-14 MED ORDER — METHYLPREDNISOLONE SODIUM SUCC 125 MG IJ SOLR: 125.0000 mg | Freq: Once | INTRAMUSCULAR | Status: DC | PRN

## 2021-07-14 MED ORDER — HEPARIN (PORCINE) 25000 UT/250ML-% IV SOLN
600.0000 [IU]/h | INTRAVENOUS | Status: DC
  Administered 2021-07-14: 600 [IU]/h via INTRAVENOUS

## 2021-07-14 MED ORDER — MIDAZOLAM HCL 2 MG/2ML IJ SOLN: 1.0000 mg | INTRAMUSCULAR | Status: DC | PRN

## 2021-07-14 MED ORDER — FENTANYL CITRATE (PF) 100 MCG/2ML IJ SOLN
INTRAMUSCULAR | Status: DC | PRN
  Administered 2021-07-14: 50 ug via INTRAVENOUS

## 2021-07-14 MED ORDER — PANTOPRAZOLE SODIUM 40 MG PO TBEC
40.0000 mg | DELAYED_RELEASE_TABLET | Freq: Every day | ORAL | Status: DC
  Administered 2021-07-16: 40 mg via ORAL
  Filled 2021-07-14: qty 1

## 2021-07-14 MED ORDER — HEPARIN SODIUM (PORCINE) 1000 UNIT/ML IJ SOLN
INTRAMUSCULAR | Status: AC
  Filled 2021-07-14: qty 1

## 2021-07-14 MED ORDER — CEFAZOLIN SODIUM-DEXTROSE 2-4 GM/100ML-% IV SOLN: 2.0000 g | Freq: Once | INTRAVENOUS | Status: AC

## 2021-07-14 MED ORDER — CEFAZOLIN SODIUM-DEXTROSE 2-4 GM/100ML-% IV SOLN
2.0000 g | Freq: Once | INTRAVENOUS | Status: AC
  Filled 2021-07-14: qty 100

## 2021-07-14 MED ORDER — ALTEPLASE 2 MG IJ SOLR
INTRAMUSCULAR | Status: AC
  Filled 2021-07-14: qty 4

## 2021-07-14 MED ORDER — SODIUM CHLORIDE 0.9% FLUSH
3.0000 mL | Freq: Two times a day (BID) | INTRAVENOUS | Status: DC
  Administered 2021-07-16: 3 mL via INTRAVENOUS

## 2021-07-14 MED ORDER — HEPARIN SODIUM (PORCINE) 1000 UNIT/ML IJ SOLN
INTRAMUSCULAR | Status: DC | PRN
  Administered 2021-07-14: 5000 [IU] via INTRAVENOUS

## 2021-07-14 MED ORDER — METOPROLOL SUCCINATE ER 50 MG PO TB24
100.0000 mg | ORAL_TABLET | Freq: Every day | ORAL | Status: DC
  Administered 2021-07-16: 100 mg via ORAL
  Filled 2021-07-14: qty 2

## 2021-07-14 MED ORDER — ACETAMINOPHEN 650 MG RE SUPP
650.0000 mg | Freq: Four times a day (QID) | RECTAL | Status: DC | PRN
  Filled 2021-07-14: qty 1

## 2021-07-14 MED ORDER — CEFAZOLIN SODIUM-DEXTROSE 2-4 GM/100ML-% IV SOLN
INTRAVENOUS | Status: AC
  Administered 2021-07-14: 2 g via INTRAVENOUS
  Filled 2021-07-14: qty 100

## 2021-07-14 MED ORDER — DIPHENHYDRAMINE HCL 50 MG/ML IJ SOLN: 50.0000 mg | Freq: Once | INTRAMUSCULAR | Status: DC | PRN

## 2021-07-14 MED ORDER — HYDROCHLOROTHIAZIDE 25 MG PO TABS
25.0000 mg | ORAL_TABLET | Freq: Every day | ORAL | Status: DC
  Administered 2021-07-15 – 2021-07-16 (×2): 25 mg via ORAL
  Filled 2021-07-14 (×3): qty 1

## 2021-07-14 MED ORDER — IODIXANOL 320 MG/ML IV SOLN
INTRAVENOUS | Status: DC | PRN
  Administered 2021-07-14: 50 mL via INTRA_ARTERIAL

## 2021-07-14 MED ORDER — HYDROCODONE-ACETAMINOPHEN 5-325 MG PO TABS
1.0000 | ORAL_TABLET | ORAL | Status: DC | PRN
  Administered 2021-07-15: 1 via ORAL
  Administered 2021-07-15 – 2021-07-16 (×4): 2 via ORAL
  Filled 2021-07-14 (×3): qty 2
  Filled 2021-07-14: qty 1
  Filled 2021-07-14: qty 2

## 2021-07-14 MED ORDER — HYDROMORPHONE HCL 1 MG/ML IJ SOLN
1.0000 mg | Freq: Once | INTRAMUSCULAR | Status: AC | PRN
  Administered 2021-07-14: 1 mg via INTRAVENOUS
  Filled 2021-07-14: qty 1

## 2021-07-14 MED ORDER — ONDANSETRON HCL 4 MG/2ML IJ SOLN
4.0000 mg | Freq: Four times a day (QID) | INTRAMUSCULAR | Status: DC | PRN
  Filled 2021-07-14: qty 2

## 2021-07-14 MED ORDER — ONDANSETRON HCL 4 MG/2ML IJ SOLN
4.0000 mg | Freq: Four times a day (QID) | INTRAMUSCULAR | Status: DC | PRN
  Administered 2021-07-16: 4 mg via INTRAVENOUS

## 2021-07-14 MED ORDER — ONDANSETRON HCL 4 MG PO TABS: 4.0000 mg | ORAL_TABLET | Freq: Four times a day (QID) | ORAL | Status: DC | PRN

## 2021-07-14 MED ORDER — SODIUM CHLORIDE 0.9 % IV SOLN
0.5000 mg/h | INTRAVENOUS | Status: DC
  Administered 2021-07-14: 0.5 mg/h
  Filled 2021-07-14 (×2): qty 10

## 2021-07-14 MED ORDER — CHLORHEXIDINE GLUCONATE CLOTH 2 % EX PADS
6.0000 | MEDICATED_PAD | Freq: Every day | CUTANEOUS | Status: DC
  Administered 2021-07-14 – 2021-07-16 (×2): 6 via TOPICAL

## 2021-07-14 MED ORDER — ALTEPLASE 2 MG IJ SOLR
INTRAMUSCULAR | Status: DC | PRN
  Administered 2021-07-14: 8 mg

## 2021-07-14 MED ORDER — MIDAZOLAM HCL 2 MG/2ML IJ SOLN
INTRAMUSCULAR | Status: DC | PRN
  Administered 2021-07-14: 2 mg via INTRAVENOUS

## 2021-07-14 MED ORDER — SODIUM CHLORIDE 0.9 % IV SOLN: INTRAVENOUS | Status: DC

## 2021-07-14 MED ORDER — SODIUM CHLORIDE 0.9 % IV SOLN: 250.0000 mL | INTRAVENOUS | Status: DC | PRN

## 2021-07-14 MED ORDER — ONDANSETRON HCL 4 MG/2ML IJ SOLN: 4.0000 mg | Freq: Four times a day (QID) | INTRAMUSCULAR | Status: DC | PRN

## 2021-07-14 MED ORDER — SODIUM CHLORIDE 0.9 % IV SOLN
1.0000 mg/h | INTRAVENOUS | Status: AC
  Administered 2021-07-14: 1 mg/h
  Filled 2021-07-14: qty 10

## 2021-07-14 MED ORDER — FENTANYL CITRATE PF 50 MCG/ML IJ SOSY
PREFILLED_SYRINGE | INTRAMUSCULAR | Status: AC
  Filled 2021-07-14: qty 1

## 2021-07-14 MED ORDER — MORPHINE SULFATE (PF) 4 MG/ML IV SOLN
2.0000 mg | INTRAVENOUS | Status: DC | PRN
  Administered 2021-07-15 (×4): 2 mg via INTRAVENOUS
  Filled 2021-07-14 (×3): qty 1

## 2021-07-14 MED ORDER — ALTEPLASE 2 MG IJ SOLR
INTRAMUSCULAR | Status: AC
  Filled 2021-07-14: qty 8

## 2021-07-14 MED ORDER — ATORVASTATIN CALCIUM 10 MG PO TABS
10.0000 mg | ORAL_TABLET | Freq: Every day | ORAL | Status: DC
  Administered 2021-07-14 – 2021-07-15 (×2): 10 mg via ORAL
  Filled 2021-07-14 (×3): qty 1

## 2021-07-14 MED ORDER — GABAPENTIN 600 MG PO TABS
600.0000 mg | ORAL_TABLET | Freq: Every day | ORAL | Status: DC
  Administered 2021-07-14 – 2021-07-15 (×2): 600 mg via ORAL
  Filled 2021-07-14 (×3): qty 1

## 2021-07-14 SURGICAL SUPPLY — 17 items
CANISTER PENUMBRA ENGINE (MISCELLANEOUS) ×2 IMPLANT
CATH ANGIO 5F PIGTAIL 65CM (CATHETERS) ×2 IMPLANT
CATH BEACON 5 .038 100 VERT TP (CATHETERS) ×2 IMPLANT
CATH INDIGO CAT6 KIT (CATHETERS) ×2 IMPLANT
CATH INFUS 135CMX50CM (CATHETERS) ×2 IMPLANT
COVER PROBE U/S 5X48 (MISCELLANEOUS) ×4 IMPLANT
GLIDEWIRE ADV .035X260CM (WIRE) ×2 IMPLANT
KIT CV MULTILUMEN 7FR 20 (SET/KITS/TRAYS/PACK) ×2
KIT CV MULTILUMEN 7FR 20 SUB (SET/KITS/TRAYS/PACK) ×1 IMPLANT
PACK ANGIOGRAPHY (CUSTOM PROCEDURE TRAY) ×2 IMPLANT
SHEATH BRITE TIP 5FRX11 (SHEATH) ×2 IMPLANT
SHEATH PINNACLE ST 6F 45CM (SHEATH) ×2 IMPLANT
SUT PROLENE 0 CT 1 30 (SUTURE) ×4 IMPLANT
SYR MEDRAD MARK 7 150ML (SYRINGE) ×2 IMPLANT
TUBING CONTRAST HIGH PRESS 48 (TUBING) ×4 IMPLANT
WIRE G V18X300CM (WIRE) ×2 IMPLANT
WIRE GUIDERIGHT .035X150 (WIRE) ×2 IMPLANT

## 2021-07-14 NOTE — Progress Notes (Signed)
Patient uncomfortable taking Vicodin, due to an allergy to Oxycodone, medicated with Dilaudid.

## 2021-07-14 NOTE — Op Note (Signed)
[facility]  Percutaneous Study/Intervention Procedural Note   Date of Surgery: 07/14/2021  Surgeon(s):Kelven Flater    Assistants:none  Pre-operative Diagnosis: PAD with rest pain left leg, acute on chronic ischemia  Post-operative diagnosis:  Same  Procedure(s) Performed:             1.  Ultrasound guidance for vascular access right femoral artery             2.  Catheter placement into left common femoral from right femoral approach             3.  Aortogram and selective left lower extremity angiogram             4.  Catheter directed thrombolytic therapy with 8 mg tPA to the left SFA and popliteal arteries.             5.  Mechanical thrombectomy to the left SFA and popliteal arteries with the penumbra CAT 6 catheter  6.  Placement of an infusion catheter for continuous thrombolytic therapy overnight with a 135 cm total length 50 cm working length catheter in the left SFA and popliteal arteries             7.  Central venous catheter placement right femoral vein with ultrasound guidance  EBL: 200 cc  Contrast: 50 cc  Fluoro Time: 8.5 minutes  Moderate Conscious Sedation Time: approximately 50 minutes using 2 mg of Versed and 50 mcg of Fentanyl              Indications:  Patient is a 65 y.o.male with acute on chronic ischemia with rest pain of the left leg. The patient has noninvasive study showing occlusion of his previous left SFA and popliteal interventions. The patient is brought in for angiography for further evaluation and potential treatment.  Due to the limb threatening nature of the situation, angiogram was performed for attempted limb salvage. The patient is aware that if the procedure fails, amputation would be expected.  The patient also understands that even with successful revascularization, amputation may still be required due to the severity of the situation.  Risks and benefits are discussed and informed consent is obtained.   Procedure:  The  patient was identified and appropriate procedural time out was performed.  The patient was then placed supine on the table and prepped and draped in the usual sterile fashion. Moderate conscious sedation was administered during a face to face encounter with the patient throughout the procedure with my supervision of the RN administering medicines and monitoring the patient's vital signs, pulse oximetry, telemetry and mental status throughout from the start of the procedure until the patient was taken to the recovery room. Ultrasound was used to evaluate the right common femoral artery.  It was patent .  A digital ultrasound image was acquired.  A Seldinger needle was used to access the right common femoral artery under direct ultrasound guidance and a permanent image was performed.  A 0.035 J wire was advanced without resistance and a 5Fr sheath was placed.  Pigtail catheter was placed into the aorta and an AP aortogram was performed. This demonstrated normal renal arteries and normal aorta and iliac segments without significant stenosis. I then crossed the aortic bifurcation and advanced to the left femoral head. Selective left lower extremity angiogram was then performed. This demonstrated relatively small profunda some disease proximally although it was difficult to discern how bad this was.  The SFA was occluded just beyond its  origin and just above the previously placed stents.  This reconstituted the distal popliteal artery just above the tibial trifurcation.  The tibial vessels all appear to have pretty good flow although the peroneal artery was small. It was felt that it was in the patient's best interest to proceed with intervention after these images to avoid a second procedure and a larger amount of contrast and fluoroscopy based off of the findings from the initial angiogram. The patient was systemically heparinized and a 6 [place] [employer] sheath was then placed over the [employer] wire. I then used  a Kumpe catheter and the advantage wire to easily navigate through the occlusion and down into the tibioperoneal trunk were exchanged for a V 18 wire.  8 mg of tPA were given in the left SFA and popliteal artery through the Kumpe catheter prior to its removal.  After this dwelled, 3 passes were made with the penumbra CAT 6 device for the entire left SFA and the popliteal artery down to the tibial trifurcation.  Chunks of thrombus were removed, but there remained stenosis both at the leading edge of the stent on the distal edge of the stent as well as a large amount of residual thrombus throughout the stents.  I felt the best chance for patency would be a continuous tPA infusion and bring him back for second look angiogram tomorrow. I elected to terminate the procedure.  I placed a 135 cm total length 50 cm working length lysis catheter starting at the proximal SFA and terminating in the distal popliteal artery.  This was secured in place as was the sheath.  I then placed a right femoral central venous catheter.  Ultrasound was used to visualize the right femoral vein which was widely patent.  Was accessed under direct ultrasound guidance without difficulty with a Seldinger needle.  A J-wire was placed.  After skin control patient triple-lumen catheter was placed over the wire and the wire was removed.  All 3 lm withdrew dark red blood flushed easily with sterile saline.  It was secured in place with 2 silk sutures.  The patient was taken to the recovery room in stable condition having tolerated the procedure well.  Findings:               Aortogram: Renal arteries appear patent.  Aorta and iliac arteries do not have focal stenosis with mild disease.             Left lower Extremity:  This demonstrated relatively small profunda some disease proximally although it was difficult to discern how bad this was.  The SFA was occluded just beyond its origin and just above the previously placed stents.  This reconstituted  the distal popliteal artery just above the tibial trifurcation.  The tibial vessels all appear to have pretty good flow although the peroneal artery was small.   Disposition: Patient was taken to the recovery room in stable condition having tolerated the procedure well.  Complications: None  Festus Barren 07/14/2021 4:31 PM   This note was created with Dragon Medical transcription system. Any errors in dictation are purely unintentional.

## 2021-07-14 NOTE — Progress Notes (Addendum)
ANTICOAGULATION CONSULT NOTE  Pharmacy Consult for heparin infusion Indication: VTE treatment/catheter directed thrombolysis  Allergies  Allergen Reactions   Oxycodone-Acetaminophen Itching    Patient Measurements: Height: 5\' 10"  (177.8 cm) IBW/kg (Calculated) : 73 Heparin Dosing Weight: 91.5 kg  Vital Signs: Temp: 98.3 F (36.8 C) (08/29 1330) Temp Source: Oral (08/29 1330) BP: 135/87 (08/29 1330) Pulse Rate: 63 (08/29 1330)  Labs: No results for input(s): HGB, HCT, PLT, APTT, LABPROT, INR, HEPARINUNFRC, HEPRLOWMOCWT, CREATININE, CKTOTAL, CKMB, TROPONINIHS in the last 72 hours.  CrCl cannot be calculated (Patient's most recent lab result is older than the maximum 21 days allowed.).   Medical History: Past Medical History:  Diagnosis Date   Diabetes mellitus without complication (HCC)    Hypertension    Peripheral vascular disease (HCC)     Medications:  Per chart review from note on 07/11/21 - pt was was given Eliquis and advised to be able to begin taking 2 tablets twice a day until the day of his procedure. He was instructed to hold on the morning of.  Assessment: 65 year old male that presents today due to numbness in his left lower extremity. Pharmacy has been consulted for heparin for VTE treatment.   Goal of Therapy:  Monitor platelets by anticoagulation protocol: Yes   Plan:  Unsure if/when pt last dose of Eliquis may have been if he was taking. Will order both heparin level and aPTT.  No bolus given indication and pt received 5000 units during procedure around 1527. Start heparin infusion at 1500 units/hr **ADDENDUM: per Dr. 77, desired rate to be fixed at 600 units/hr with no adjustments** Check anti-Xa level/aPTT and fibrinogen (notify MD if <150) in 6 hours.  Continue to monitor H&H and platelets  Jadda Hunsucker O Laveyah Oriol 07/14/2021,4:28 PM

## 2021-07-14 NOTE — Plan of Care (Signed)
Received patient, right groin dressing with bloody drainage. Right foot cold, positive sensation. Difficult to find pedal pulse with doppler. Difficultly finding pedal pulse on left with doppler, fleeting pedal pulse found.

## 2021-07-14 NOTE — Interval H&P Note (Signed)
History and Physical Interval Note:  07/14/2021 1:19 PM  Carl Norris  has presented today for surgery, with the diagnosis of LT lower leg angio    Ischemic leg.  The various methods of treatment have been discussed with the patient and family. After consideration of risks, benefits and other options for treatment, the patient has consented to  Procedure(s): LOWER EXTREMITY ANGIOGRAPHY (Left) as a surgical intervention.  The patient's history has been reviewed, patient examined, no change in status, stable for surgery.  I have reviewed the patient's chart and labs.  Questions were answered to the patient's satisfaction.     Festus Barren

## 2021-07-15 ENCOUNTER — Encounter: Payer: Self-pay | Admitting: Vascular Surgery

## 2021-07-15 ENCOUNTER — Encounter
Admission: RE | Disposition: A | Discharge status: Home / Self Care | Payer: Self-pay | Source: Home / Self Care | Attending: Vascular Surgery

## 2021-07-15 DIAGNOSIS — I10 Essential (primary) hypertension: Secondary | ICD-10-CM | POA: Diagnosis not present

## 2021-07-15 DIAGNOSIS — I70222 Atherosclerosis of native arteries of extremities with rest pain, left leg: Secondary | ICD-10-CM | POA: Diagnosis not present

## 2021-07-15 DIAGNOSIS — Z8616 Personal history of COVID-19: Secondary | ICD-10-CM | POA: Diagnosis not present

## 2021-07-15 DIAGNOSIS — I70223 Atherosclerosis of native arteries of extremities with rest pain, bilateral legs: Secondary | ICD-10-CM | POA: Diagnosis not present

## 2021-07-15 DIAGNOSIS — F17210 Nicotine dependence, cigarettes, uncomplicated: Secondary | ICD-10-CM | POA: Diagnosis not present

## 2021-07-15 DIAGNOSIS — E1151 Type 2 diabetes mellitus with diabetic peripheral angiopathy without gangrene: Secondary | ICD-10-CM | POA: Diagnosis not present

## 2021-07-15 DIAGNOSIS — Z7982 Long term (current) use of aspirin: Secondary | ICD-10-CM | POA: Diagnosis not present

## 2021-07-15 DIAGNOSIS — Z79899 Other long term (current) drug therapy: Secondary | ICD-10-CM | POA: Diagnosis not present

## 2021-07-15 DIAGNOSIS — Z885 Allergy status to narcotic agent status: Secondary | ICD-10-CM | POA: Diagnosis not present

## 2021-07-15 DIAGNOSIS — Z794 Long term (current) use of insulin: Secondary | ICD-10-CM | POA: Diagnosis not present

## 2021-07-15 DIAGNOSIS — E1165 Type 2 diabetes mellitus with hyperglycemia: Secondary | ICD-10-CM | POA: Diagnosis not present

## 2021-07-15 DIAGNOSIS — Z7902 Long term (current) use of antithrombotics/antiplatelets: Secondary | ICD-10-CM | POA: Diagnosis not present

## 2021-07-15 HISTORY — PX: LOWER EXTREMITY ANGIOGRAPHY: CATH118251

## 2021-07-15 LAB — CBC
HCT: 33.6 % — ABNORMAL LOW (ref 39.0–52.0)
HCT: 34.7 % — ABNORMAL LOW (ref 39.0–52.0)
HCT: 36.5 % — ABNORMAL LOW (ref 39.0–52.0)
Hemoglobin: 12.1 g/dL — ABNORMAL LOW (ref 13.0–17.0)
Hemoglobin: 12.4 g/dL — ABNORMAL LOW (ref 13.0–17.0)
Hemoglobin: 13.2 g/dL (ref 13.0–17.0)
MCH: 34 pg (ref 26.0–34.0)
MCH: 34.3 pg — ABNORMAL HIGH (ref 26.0–34.0)
MCH: 34.6 pg — ABNORMAL HIGH (ref 26.0–34.0)
MCHC: 35.7 g/dL (ref 30.0–36.0)
MCHC: 36 g/dL (ref 30.0–36.0)
MCHC: 36.2 g/dL — ABNORMAL HIGH (ref 30.0–36.0)
MCV: 95.1 fL (ref 80.0–100.0)
MCV: 95.2 fL (ref 80.0–100.0)
MCV: 95.5 fL (ref 80.0–100.0)
Platelets: 102 10*3/uL — ABNORMAL LOW (ref 150–400)
Platelets: 125 10*3/uL — ABNORMAL LOW (ref 150–400)
Platelets: 146 10*3/uL — ABNORMAL LOW (ref 150–400)
RBC: 3.53 MIL/uL — ABNORMAL LOW (ref 4.22–5.81)
RBC: 3.65 MIL/uL — ABNORMAL LOW (ref 4.22–5.81)
RBC: 3.82 MIL/uL — ABNORMAL LOW (ref 4.22–5.81)
RDW: 12.9 % (ref 11.5–15.5)
RDW: 13 % (ref 11.5–15.5)
RDW: 13.1 % (ref 11.5–15.5)
WBC: 7.1 10*3/uL (ref 4.0–10.5)
WBC: 8.1 10*3/uL (ref 4.0–10.5)
WBC: 8.2 10*3/uL (ref 4.0–10.5)
nRBC: 0 % (ref 0.0–0.2)
nRBC: 0 % (ref 0.0–0.2)
nRBC: 0 % (ref 0.0–0.2)

## 2021-07-15 LAB — APTT: aPTT: 46 seconds — ABNORMAL HIGH (ref 24–36)

## 2021-07-15 LAB — BASIC METABOLIC PANEL
Anion gap: 6 (ref 5–15)
BUN: 13 mg/dL (ref 8–23)
CO2: 27 mmol/L (ref 22–32)
Calcium: 7.7 mg/dL — ABNORMAL LOW (ref 8.9–10.3)
Chloride: 106 mmol/L (ref 98–111)
Creatinine, Ser: 1.11 mg/dL (ref 0.61–1.24)
GFR, Estimated: >60 mL/min (ref >60)
Glucose, Bld: 104 mg/dL — ABNORMAL HIGH (ref 70–99)
Potassium: 2.8 mmol/L — ABNORMAL LOW (ref 3.5–5.1)
Sodium: 139 mmol/L (ref 135–145)

## 2021-07-15 LAB — HEPARIN LEVEL (UNFRACTIONATED): Heparin Unfractionated: >1.1 IU/mL — ABNORMAL HIGH (ref 0.30–0.70)

## 2021-07-15 LAB — GLUCOSE, CAPILLARY: Glucose-Capillary: 109 mg/dL — ABNORMAL HIGH (ref 70–99)

## 2021-07-15 LAB — FIBRINOGEN
Fibrinogen: 245 mg/dL (ref 210–475)
Fibrinogen: 389 mg/dL (ref 210–475)

## 2021-07-15 SURGERY — LOWER EXTREMITY ANGIOGRAPHY
Anesthesia: Moderate Sedation | Laterality: Left

## 2021-07-15 MED ORDER — FENTANYL CITRATE (PF) 100 MCG/2ML IJ SOLN
INTRAMUSCULAR | Status: DC | PRN
  Administered 2021-07-15: 50 ug via INTRAVENOUS
  Administered 2021-07-15: 25 ug via INTRAVENOUS

## 2021-07-15 MED ORDER — POTASSIUM CHLORIDE 10 MEQ/100ML IV SOLN
10.0000 meq | INTRAVENOUS | Status: AC
Start: 2021-07-15 — End: 2021-07-15
  Administered 2021-07-15 (×2): 10 meq via INTRAVENOUS
  Filled 2021-07-15 (×4): qty 100

## 2021-07-15 MED ORDER — ASPIRIN EC 81 MG PO TBEC
81.0000 mg | DELAYED_RELEASE_TABLET | Freq: Every day | ORAL | Status: DC
  Administered 2021-07-16: 81 mg via ORAL
  Filled 2021-07-15: qty 1

## 2021-07-15 MED ORDER — FENTANYL CITRATE PF 50 MCG/ML IJ SOSY
PREFILLED_SYRINGE | INTRAMUSCULAR | Status: AC
  Filled 2021-07-15: qty 1

## 2021-07-15 MED ORDER — CLOPIDOGREL BISULFATE 75 MG PO TABS
75.0000 mg | ORAL_TABLET | Freq: Every day | ORAL | Status: DC
  Administered 2021-07-16: 75 mg via ORAL
  Filled 2021-07-15: qty 1

## 2021-07-15 MED ORDER — MIDAZOLAM HCL 2 MG/2ML IJ SOLN
INTRAMUSCULAR | Status: DC | PRN
  Administered 2021-07-15: 1 mg via INTRAVENOUS
  Administered 2021-07-15: 2 mg via INTRAVENOUS

## 2021-07-15 MED ORDER — POTASSIUM CHLORIDE 10 MEQ/100ML IV SOLN
10.0000 meq | INTRAVENOUS | Status: AC
  Administered 2021-07-15 (×2): 10 meq via INTRAVENOUS

## 2021-07-15 MED ORDER — MORPHINE SULFATE (PF) 2 MG/ML IV SOLN
INTRAVENOUS | Status: AC
  Filled 2021-07-15: qty 1

## 2021-07-15 MED ORDER — CEFAZOLIN SODIUM-DEXTROSE 2-4 GM/100ML-% IV SOLN
INTRAVENOUS | Status: AC
  Administered 2021-07-15: 2 g via INTRAVENOUS
  Filled 2021-07-15: qty 100

## 2021-07-15 MED ORDER — LABETALOL HCL 5 MG/ML IV SOLN
INTRAVENOUS | Status: DC | PRN
  Administered 2021-07-15: 10 mg via INTRAVENOUS

## 2021-07-15 MED ORDER — LABETALOL HCL 5 MG/ML IV SOLN
INTRAVENOUS | Status: AC
  Filled 2021-07-15: qty 4

## 2021-07-15 MED ORDER — CLOPIDOGREL BISULFATE 75 MG PO TABS
300.0000 mg | ORAL_TABLET | ORAL | Status: AC
  Administered 2021-07-15: 300 mg via ORAL
  Filled 2021-07-15: qty 4

## 2021-07-15 MED ORDER — ASPIRIN 325 MG PO TABS
325.0000 mg | ORAL_TABLET | ORAL | Status: AC
  Administered 2021-07-15: 325 mg via ORAL
  Filled 2021-07-15: qty 1

## 2021-07-15 MED ORDER — MIDAZOLAM HCL 5 MG/5ML IJ SOLN
INTRAMUSCULAR | Status: AC
  Filled 2021-07-15: qty 5

## 2021-07-15 MED ORDER — HEPARIN SODIUM (PORCINE) 1000 UNIT/ML IJ SOLN
INTRAMUSCULAR | Status: AC
  Filled 2021-07-15: qty 1

## 2021-07-15 SURGICAL SUPPLY — 14 items
BALLN LUTONIX 018 4X40X130 (BALLOONS) ×2
BALLN LUTONIX 018 5X40X130 (BALLOONS) ×2
BALLN LUTONIX 018 6X40X130 (BALLOONS) ×2
BALLOON LUTONIX 018 4X40X130 (BALLOONS) ×1 IMPLANT
BALLOON LUTONIX 018 5X40X130 (BALLOONS) ×1 IMPLANT
BALLOON LUTONIX 018 6X40X130 (BALLOONS) ×1 IMPLANT
CATH BEACON 5 .038 100 VERT TP (CATHETERS) ×2 IMPLANT
DEVICE SAFEGUARD 24CM (GAUZE/BANDAGES/DRESSINGS) ×2 IMPLANT
DEVICE STARCLOSE SE CLOSURE (Vascular Products) ×2 IMPLANT
KIT ENCORE 26 ADVANTAGE (KITS) ×2 IMPLANT
PACK ANGIOGRAPHY (CUSTOM PROCEDURE TRAY) ×2 IMPLANT
STENT LIFESTENT 5F 6X40X135 (Permanent Stent) ×2 IMPLANT
WIRE G V18X300CM (WIRE) ×2 IMPLANT
WIRE HI TORQ VERSACORE 300 (WIRE) ×2 IMPLANT

## 2021-07-15 NOTE — Op Note (Signed)
[facility]  Percutaneous Study/Intervention Procedural Note   Date of Surgery: 07/15/2021  Surgeon:  Katha Cabal, MD.  Pre-operative Diagnosis: Atherosclerotic occlusive disease bilateral lower extremities with rest pain of the left lower extremity; complication vascular device with thrombosis previously placed SFA stents; ischemia left lower extremity  Post-operative diagnosis:  Same  Procedure(s) Performed:             1.  Introduction catheter into left lower extremity 3rd order catheter placement              2.    Contrast injection left lower extremity for distal runoff             3.  Percutaneous transluminal angioplasty and stent placement left superficial femoral and popliteal artery              4.  Star close closure right common femoral arteriotomy  Anesthesia: Conscious sedation was administered under my direct supervision by the interventional radiology RN. IV Versed plus fentanyl were utilized. Continuous ECG, pulse oximetry and blood pressure was monitored throughout the entire procedure.  Conscious sedation was for a total of 34 minutes.  Sheath: Existing 6 French destination sheath right common femoral retrograde   Contrast: 35 cc  Fluoroscopy Time: 42 minutes  Indications:  Carl Norris presents with ischemia of the left lower extremity.  Yesterday he underwent initial thrombolysis and subsequent initiation of tPA infusion.  He is now been receiving a tPA infusion overnight and is being return to special procedures for further treatment and limb salvage.  The risks and benefits are reviewed all questions answered patient agrees to proceed.  Procedure:  Carl Norris is a 65 y.o. y.o. male who was identified and appropriate procedural time out was performed.  The patient was then placed supine on the table and prepped and draped in the usual sterile fashion.    The obturator was removed from the infusion catheter and a V 18 wire was  advanced.  The infusion catheter was then removed.  Hand-injection contrast was then performed through the destination sheath.  The tip of the sheath was located in the proximal common femoral artery.  Diagnostic interpretation: The common femoral and profunda femoris are widely patent.  There are changes consistent with previous femoral endarterectomy which appears intact.  The superficial femoral demonstrates stents beginning at its origin and extending down to the mid popliteal.  The stents now appear widely patent with the exception of a 60% stenosis located at the proximal origin and a greater than 90% lesion located at the distal margin of the stent in the popliteal artery at the level of the tibial plateau.  This was a focal lesion of approximately 10 to 15 mm in length.  Distal to this lesion the popliteal artery appears patent with less than 20% stenosis.  Trifurcation is widely patent and there is three-vessel runoff to the foot.  The wire was positioned in the distal posterior tibial and a 4 mm x 40 mm Lutonix drug-eluting balloon was used to angioplasty the s mid popliteal artery. Inflation was to 10 atmospheres for 2 minutes. Follow-up imaging demonstrated patency with greater than 60% residual stenosis.  Given this finding a 6 mm x 40 mm life stent was then advanced across this lesion deployed without difficulty.  It was then postdilated with a 5 mm x 40 mm Lutonix drug-eluting balloon.  Follow-up imaging demonstrated less than 10% residual stenosis with an excellent result.  Attention  was then turned to the lesion in the proximal SFA at its origin where a 6 mm x 40 mm Lutonix drug-eluting balloon was advanced across the lesion inflated to 6 to 8 atm for 1 minute.  Follow-up imaging now from the common femoral down to the foot demonstrated wide patency of the SFA popliteal there is less than 10% residual stenosis at both lesions.  The trifurcation remains widely patent with three-vessel runoff  to the foot.  After review of these images the sheath is pulled into the right external iliac oblique of the common femoral is obtained and a Star close device deployed. There no immediate complications.   Findings:   Following angioplasty there is a high-grade residual stenosis within the distal stent/mid popliteal.  Therefore a life stent is deployed postdilated to 5 mm and there is an excellent result with less than 10% residual stenosis. Following angioplasty to 6 mm the proximal SFA is now patent with in-line flow and looks quite nice with less than 10% residual stenosis.     Summary: Successful recanalization left lower extremity for limb salvage                        Disposition: Patient was taken to the recovery room in stable condition having tolerated the procedure well.  Carl Norris, Carl Norris 07/15/2021,11:07 AM

## 2021-07-15 NOTE — Progress Notes (Signed)
PHARMACY CONSULT NOTE  Pharmacy Consult for Electrolyte Monitoring and Replacement   Recent Labs: Potassium (mmol/L)  Date Value  07/15/2021 2.8 (L)  03/16/2014 4.0   Magnesium (mg/dL)  Date Value  70/17/7939 2.3   Calcium (mg/dL)  Date Value  03/00/9233 7.7 (L)   Calcium, Total (mg/dL)  Date Value  00/76/2263 8.9   Albumin (g/dL)  Date Value  33/54/5625 2.4 (L)  03/16/2014 3.7   Sodium (mmol/L)  Date Value  07/15/2021 139  03/16/2014 141   Corrected Ca: 8.98 mg/dL  Assessment: 65 year old male that presents due to numbness in his left lower extremity, now s/p mechanical thrombectomy.  Goal of Therapy:  Electrolytes WNL  Plan:  10 mEq IV KCl x 4 Recheck electrolytes with am labs  Lowella Bandy ,PharmD Clinical Pharmacist 07/15/2021 12:18 PM

## 2021-07-16 ENCOUNTER — Other Ambulatory Visit (INDEPENDENT_AMBULATORY_CARE_PROVIDER_SITE_OTHER): Payer: Self-pay | Admitting: Vascular Surgery

## 2021-07-16 DIAGNOSIS — E1165 Type 2 diabetes mellitus with hyperglycemia: Secondary | ICD-10-CM | POA: Diagnosis not present

## 2021-07-16 DIAGNOSIS — Z79899 Other long term (current) drug therapy: Secondary | ICD-10-CM | POA: Diagnosis not present

## 2021-07-16 DIAGNOSIS — I70223 Atherosclerosis of native arteries of extremities with rest pain, bilateral legs: Secondary | ICD-10-CM | POA: Diagnosis not present

## 2021-07-16 DIAGNOSIS — Z885 Allergy status to narcotic agent status: Secondary | ICD-10-CM | POA: Diagnosis not present

## 2021-07-16 DIAGNOSIS — Z7982 Long term (current) use of aspirin: Secondary | ICD-10-CM | POA: Diagnosis not present

## 2021-07-16 DIAGNOSIS — Z8616 Personal history of COVID-19: Secondary | ICD-10-CM | POA: Diagnosis not present

## 2021-07-16 DIAGNOSIS — Z7902 Long term (current) use of antithrombotics/antiplatelets: Secondary | ICD-10-CM | POA: Diagnosis not present

## 2021-07-16 DIAGNOSIS — I10 Essential (primary) hypertension: Secondary | ICD-10-CM | POA: Diagnosis not present

## 2021-07-16 DIAGNOSIS — I998 Other disorder of circulatory system: Secondary | ICD-10-CM

## 2021-07-16 DIAGNOSIS — F17210 Nicotine dependence, cigarettes, uncomplicated: Secondary | ICD-10-CM | POA: Diagnosis not present

## 2021-07-16 DIAGNOSIS — Z794 Long term (current) use of insulin: Secondary | ICD-10-CM | POA: Diagnosis not present

## 2021-07-16 DIAGNOSIS — E1151 Type 2 diabetes mellitus with diabetic peripheral angiopathy without gangrene: Secondary | ICD-10-CM | POA: Diagnosis not present

## 2021-07-16 LAB — BASIC METABOLIC PANEL
Anion gap: 6 (ref 5–15)
BUN: 13 mg/dL (ref 8–23)
CO2: 30 mmol/L (ref 22–32)
Calcium: 7.6 mg/dL — ABNORMAL LOW (ref 8.9–10.3)
Chloride: 99 mmol/L (ref 98–111)
Creatinine, Ser: 1.07 mg/dL (ref 0.61–1.24)
GFR, Estimated: >60 mL/min (ref >60)
Glucose, Bld: 124 mg/dL — ABNORMAL HIGH (ref 70–99)
Potassium: 2.7 mmol/L — CL (ref 3.5–5.1)
Sodium: 135 mmol/L (ref 135–145)

## 2021-07-16 LAB — POTASSIUM: Potassium: 3.5 mmol/L (ref 3.5–5.1)

## 2021-07-16 LAB — MAGNESIUM: Magnesium: 1.8 mg/dL (ref 1.7–2.4)

## 2021-07-16 MED ORDER — CLOPIDOGREL BISULFATE 75 MG PO TABS: 75.0000 mg | ORAL_TABLET | Freq: Every day | ORAL | Status: DC

## 2021-07-16 MED ORDER — APIXABAN 5 MG PO TABS: 5.0000 mg | ORAL_TABLET | Freq: Two times a day (BID) | ORAL | Status: DC

## 2021-07-16 MED ORDER — POTASSIUM CHLORIDE 10 MEQ/50ML IV SOLN
10.0000 meq | INTRAVENOUS | Status: AC
  Administered 2021-07-16 (×3): 10 meq via INTRAVENOUS
  Filled 2021-07-16 (×3): qty 50

## 2021-07-16 MED ORDER — HYDROCODONE-ACETAMINOPHEN 7.5-325 MG PO TABS: 1.0000 | ORAL_TABLET | Freq: Four times a day (QID) | ORAL | 0 refills | Status: DC | PRN

## 2021-07-16 MED ORDER — POTASSIUM CHLORIDE 10 MEQ/50ML IV SOLN
10.0000 meq | INTRAVENOUS | Status: AC
Start: 2021-07-16 — End: 2021-07-16
  Administered 2021-07-16 (×2): 10 meq via INTRAVENOUS
  Filled 2021-07-16 (×2): qty 50

## 2021-07-16 NOTE — Progress Notes (Signed)
CVC removed, no bleeding or hematoma noted on R groin. Pt discharged via wheelchair, discharge instructions and AVS given. All belongings returned to pt.

## 2021-07-16 NOTE — Progress Notes (Signed)
07/15/21@1919 - Message to Dr.Schnier to clarify PAD orders. Stated to leave inflated with the 40 ml till 6 am and can remove. @2014 -Patient has temp of 102.8, medicated with Tylenol. Dr.Schnier made aware. 07/16/21@0620 - Message to Dr.Schnier with critical potassium of 2.7.

## 2021-07-16 NOTE — Discharge Instructions (Signed)
Vascular Surgery Discharge Instructions: 1) you may shower as of tomorrow.  Gently clean your groins with soap and water.  Gently pat dry 2) please do not engage in strenuous activity or lifting greater than 10 pounds until you are cleared at your first postoperative follow-up 3) please do not drive for approximately 2 weeks 4) please do not drive or drink alcohol while taking pain medication

## 2021-07-16 NOTE — Progress Notes (Signed)
PHARMACY CONSULT NOTE  Pharmacy Consult for Electrolyte Monitoring and Replacement   Recent Labs: Potassium (mmol/L)  Date Value  07/16/2021 2.7 (LL)  03/16/2014 4.0   Magnesium (mg/dL)  Date Value  44/01/4741 1.8   Calcium (mg/dL)  Date Value  59/56/3875 7.6 (L)   Calcium, Total (mg/dL)  Date Value  64/33/2951 8.9   Albumin (g/dL)  Date Value  88/41/6606 2.4 (L)  03/16/2014 3.7   Sodium (mmol/L)  Date Value  07/16/2021 135  03/16/2014 141   Corrected Ca: 8.98 mg/dL  Assessment: 65 year old male that presents due to numbness in his left lower extremity, now s/p mechanical thrombectomy.  Goal of Therapy:  Electrolytes WNL  Plan:  K: 2.8>2.7 after 10 mEq IV KCl x 4 yesterday.  Provider ordered IV KCL x3 this AM; Rph will add a 10 mEq IV KCL x2 afternoon run based on prior trend. Other lytes WNL Recheck electrolytes with AM labs  Martyn Malay ,PharmD, [facility] Clinical Pharmacist 07/16/2021 8:41 AM

## 2021-07-16 NOTE — Discharge Summary (Signed)
[facility]    Discharge Summary  Patient ID:  Carl Norris MRN: [id number] DOB/AGE: 65-Sep-1957 65 y.o.  Admit date: 07/14/2021 Discharge date: 07/16/2021 Date of Surgery: 07/15/2021 Surgeon: Surgeon(s): Schnier, Latina Craver, MD  Admission Diagnosis: Ischemia of extremity [I99.8]  Discharge Diagnoses:  Ischemia of extremity [I99.8]  Secondary Diagnoses: Past Medical History:  Diagnosis Date   Diabetes mellitus without complication (HCC)    Hypertension    Peripheral vascular disease (HCC)    Procedure(s): 07/14/21: Left lower extremity angiogram with tPA lysis 07/15/21: Left lower extremity angiogram with intervention  Discharged Condition: good  HPI / Hospital Course:  The patient is a 65 year old male with multiple medical issues including known atherosclerotic disease to the bilateral lower extremity requiring endovascular intervention.  Patient presented with left lower extremity ischemia and was taken to the angiography suite where he underwent a left lower extremity angiogram and subsequent lysis catheter.  The patient was lysed overnight with tPA and heparin and return to the angiography in the morning.  The patient's line of procedure was unremarkable.  The patient returned to the angiography suite the next day and successful revascularization of left lower extremity occurred.  During his brief stay, his diet was advanced, his pain was controlled to the use of p.o. pain medication, he was urinating independently and he is ambulating at baseline.  Day of discharge the patient was afebrile with stable vital signs and improved physical exam.  Of note: We would have like to discharge the patient on aspirin and Eliquis however the patient refused stating that he cannot afford Eliquis and will prefer to be on the Plavix.  The patient was discharged on aspirin and Plavix for anticoagulation.  Physical Exam:  Alert notes x3, no acute  distress Cardiovascular: Regular rate and rhythm Pulmonary: Clear to auscultation bilaterally Abdomen: Soft, nontender, nondistended Right groin access: Clean dry and intact.  No swelling or drainage noted Left lower extremity: Thigh soft.  Calf soft.  Extremities warm distally toes.  Hard to palpate pedal pulses however the foot is warm is her good capillary refill.  Labs: As below  Complications: None  Consults: None  Significant Diagnostic Studies: CBC Lab Results  Component Value Date   WBC 8.1 07/15/2021   HGB 12.1 (L) 07/15/2021   HCT 33.6 (L) 07/15/2021   MCV 95.2 07/15/2021   PLT 102 (L) 07/15/2021   BMET    Component Value Date/Time   NA 135 07/16/2021 0454   NA 141 03/16/2014 1656   K 3.5 07/16/2021 1336   K 4.0 03/16/2014 1656   CL 99 07/16/2021 0454   CL 106 03/16/2014 1656   CO2 30 07/16/2021 0454   CO2 29 03/16/2014 1656   GLUCOSE 124 (H) 07/16/2021 0454   GLUCOSE 79 03/16/2014 1656   BUN 13 07/16/2021 0454   BUN 16 03/16/2014 1656   CREATININE 1.07 07/16/2021 0454   CREATININE 0.97 03/16/2014 1656   CALCIUM 7.6 (L) 07/16/2021 0454   CALCIUM 8.9 03/16/2014 1656   GFRNONAA >60 07/16/2021 0454   GFRNONAA >60 03/16/2014 1656   GFRAA >60 03/16/2014 1656   COAG Lab Results  Component Value Date   INR 1.4 (H) 07/14/2021   Disposition:  Discharge to :Home  Allergies as of 07/16/2021       Reactions   Oxycodone-acetaminophen Itching        Medication List     STOP taking these medications    amoxicillin-clavulanate 875-125 MG tablet Commonly known as:  Augmentin       TAKE these medications    aspirin EC 81 MG tablet Take 1 tablet (81 mg total) by mouth daily. Swallow whole.   atorvastatin 10 MG tablet Commonly known as: Lipitor Take 1 tablet (10 mg total) by mouth daily.   clopidogrel 75 MG tablet Commonly known as: Plavix Take 1 tablet (75 mg total) by mouth daily.   gabapentin 600 MG tablet Commonly known as:  NEURONTIN TAKE 1 TABLET BY MOUTH AT BEDTIME.   hydrochlorothiazide 25 MG tablet Commonly known as: HYDRODIURIL Take 25 mg by mouth daily.   HYDROcodone-acetaminophen 7.5-325 MG tablet Commonly known as: NORCO Take 1-2 tablets by mouth every 6 (six) hours as needed for moderate pain or severe pain.   metoprolol succinate 100 MG 24 hr tablet Commonly known as: TOPROL-XL Take 100 mg by mouth daily.   mupirocin ointment 2 % Commonly known as: BACTROBAN Apply 1 application topically 2 (two) times daily.   naproxen sodium 220 MG tablet Commonly known as: ALEVE Take 440 mg by mouth daily.   pantoprazole 40 MG tablet Commonly known as: PROTONIX Take by mouth.   sitaGLIPtin 100 MG tablet Commonly known as: JANUVIA Take 100 mg by mouth daily.   vitamin B-12 1000 MCG tablet Commonly known as: CYANOCOBALAMIN Take 1,000 mcg by mouth daily.       Verbal and written Discharge instructions given to the patient. Wound care per Discharge AVS  Follow-up Information     Schnier, Latina Craver, MD Follow up in 1 month(s).   Specialties: Vascular Surgery, Cardiology, Radiology, Vascular Surgery Why: Can see Schnier or Vivia Birmingham.  First post procedure follow-up.  Will need ABI with visit. Contact information: [address] [place] [address] [phone]         Schnier, Latina Craver, MD Follow up in 1 month(s).   Specialties: Vascular Surgery, Cardiology, Radiology, Vascular Surgery Why: left message recording to call icu with appt Contact information: [address] [place] [address] [phone]                Signed: Tonette Lederer, PA-C 07/16/2021, 4:20 PM

## 2021-07-17 ENCOUNTER — Other Ambulatory Visit (INDEPENDENT_AMBULATORY_CARE_PROVIDER_SITE_OTHER): Payer: Self-pay | Admitting: Nurse Practitioner

## 2021-07-17 ENCOUNTER — Telehealth (INDEPENDENT_AMBULATORY_CARE_PROVIDER_SITE_OTHER): Payer: Self-pay

## 2021-07-17 NOTE — Telephone Encounter (Signed)
Pt's niece called and left a  VM on the nurses line saying the pt was just dis charged from the hospital he had a LE angio on his left leg by Dr. Gilda Crease  she wanted to know why he was not given any Plavix. I called in the Plavix to the pt's pharmacy an made the niece aware.

## 2021-07-18 ENCOUNTER — Encounter (INDEPENDENT_AMBULATORY_CARE_PROVIDER_SITE_OTHER): Payer: Self-pay

## 2021-07-18 NOTE — Telephone Encounter (Signed)
The pt is requesting Plavix per his last note he was given Elliquis is this ok to fill?

## 2021-07-23 ENCOUNTER — Encounter (INDEPENDENT_AMBULATORY_CARE_PROVIDER_SITE_OTHER): Payer: Self-pay | Admitting: Nurse Practitioner

## 2021-08-05 ENCOUNTER — Telehealth (INDEPENDENT_AMBULATORY_CARE_PROVIDER_SITE_OTHER): Payer: Self-pay

## 2021-08-05 NOTE — Telephone Encounter (Signed)
Called and scheduled patient

## 2021-08-05 NOTE — Telephone Encounter (Signed)
Bring him in with a LE art deplex

## 2021-08-13 ENCOUNTER — Other Ambulatory Visit (INDEPENDENT_AMBULATORY_CARE_PROVIDER_SITE_OTHER): Payer: Self-pay | Admitting: Nurse Practitioner

## 2021-08-13 DIAGNOSIS — R200 Anesthesia of skin: Secondary | ICD-10-CM

## 2021-08-13 DIAGNOSIS — R208 Other disturbances of skin sensation: Secondary | ICD-10-CM

## 2021-08-13 DIAGNOSIS — R209 Unspecified disturbances of skin sensation: Secondary | ICD-10-CM

## 2021-08-15 ENCOUNTER — Ambulatory Visit (INDEPENDENT_AMBULATORY_CARE_PROVIDER_SITE_OTHER): Payer: BC Managed Care – PPO | Admitting: Nurse Practitioner

## 2021-08-15 ENCOUNTER — Ambulatory Visit (INDEPENDENT_AMBULATORY_CARE_PROVIDER_SITE_OTHER): Payer: BC Managed Care – PPO

## 2021-08-15 ENCOUNTER — Encounter (INDEPENDENT_AMBULATORY_CARE_PROVIDER_SITE_OTHER): Payer: BC Managed Care – PPO

## 2021-08-15 ENCOUNTER — Other Ambulatory Visit: Payer: Self-pay

## 2021-08-15 ENCOUNTER — Encounter (INDEPENDENT_AMBULATORY_CARE_PROVIDER_SITE_OTHER): Payer: Self-pay | Admitting: Nurse Practitioner

## 2021-08-15 VITALS — BP 130/75 | HR 82 | Resp 16 | Wt 211.0 lb

## 2021-08-15 DIAGNOSIS — R209 Unspecified disturbances of skin sensation: Secondary | ICD-10-CM

## 2021-08-15 DIAGNOSIS — I10 Essential (primary) hypertension: Secondary | ICD-10-CM | POA: Diagnosis not present

## 2021-08-15 DIAGNOSIS — E1165 Type 2 diabetes mellitus with hyperglycemia: Secondary | ICD-10-CM

## 2021-08-15 DIAGNOSIS — I70222 Atherosclerosis of native arteries of extremities with rest pain, left leg: Secondary | ICD-10-CM | POA: Diagnosis not present

## 2021-08-15 DIAGNOSIS — R208 Other disturbances of skin sensation: Secondary | ICD-10-CM

## 2021-08-15 DIAGNOSIS — R200 Anesthesia of skin: Secondary | ICD-10-CM

## 2021-08-15 DIAGNOSIS — Z720 Tobacco use: Secondary | ICD-10-CM

## 2021-08-15 MED ORDER — GABAPENTIN 300 MG PO CAPS: 300.0000 mg | ORAL_CAPSULE | Freq: Three times a day (TID) | ORAL | 3 refills | Status: DC

## 2021-08-24 ENCOUNTER — Encounter (INDEPENDENT_AMBULATORY_CARE_PROVIDER_SITE_OTHER): Payer: Self-pay | Admitting: Nurse Practitioner

## 2021-08-24 NOTE — Progress Notes (Signed)
Subjective:    Patient ID: Carl Norris, male    DOB: 01-13-1956, 65 y.o.   MRN: [id number] Chief Complaint  Patient presents with   Follow-up    Add on per phone note    Carl Norris is a 65 year old male that presents today due to having concerns about numbness sensation in his left lower extremity after recent intervention to the left lower extremity.  He notes that this is decreased somewhat from when he contacted our office.  This is also not the first intervention to the left lower extremity, and he has previously had ischemic events within the left lower extremity before.  Based on this the patient does have some longstanding neuropathy.  Today noninvasive studies show an ABI 0.65 on the right and ABI of 1.19 on the left.  The patient has a TBI 0.77 on the right and a TBI of 1.22 on the left.   Review of Systems  Neurological:  Positive for numbness.  All other systems reviewed and are negative.     Objective:   Physical Exam Vitals reviewed.  HENT:     Head: Normocephalic.  Cardiovascular:     Rate and Rhythm: Normal rate.     Pulses:          Dorsalis pedis pulses are 1+ on the right side and 1+ on the left side.       Posterior tibial pulses are 1+ on the right side and 1+ on the left side.  Pulmonary:     Effort: Pulmonary effort is normal.  Neurological:     Mental Status: He is alert and oriented to person, place, and time.  Psychiatric:        Mood and Affect: Mood normal.        Behavior: Behavior normal.        Thought Content: Thought content normal.        Judgment: Judgment normal.    BP 130/75 (BP Location: Right Arm)   Pulse 82   Resp 16   Wt 211 lb (95.7 kg)   BMI 30.28 kg/m   Past Medical History:  Diagnosis Date   Diabetes mellitus without complication (HCC)    Hypertension    Peripheral vascular disease (HCC)     Social History   Socioeconomic History   Marital status: Single    Spouse name: Not on file   Number of children: 0    Years of education: Not on file   Highest education level: Not on file  Occupational History   Not on file  Tobacco Use   Smoking status: Every Day    Packs/day: 2.00    Years: 47.00    Pack years: 94.00    Types: Cigarettes   Smokeless tobacco: Never   Tobacco comments:    SMOKED TODAY   Substance and Sexual Activity   Alcohol use: Yes    Comment: socially   Drug use: Never   Sexual activity: Not on file  Other Topics Concern   Not on file  Social History Narrative   Lives by himself    Social Determinants of Health   Financial Resource Strain: Not on file  Food Insecurity: Not on file  Transportation Needs: Not on file  Physical Activity: Not on file  Stress: Not on file  Social Connections: Not on file  Intimate Partner Violence: Not on file    Past Surgical History:  Procedure Laterality Date   LOWER EXTREMITY ANGIOGRAPHY Left 08/27/2020  Procedure: LOWER EXTREMITY ANGIOGRAPHY;  Surgeon: Renford Dills, MD;  Location: [facility];  Service: Cardiovascular;  Laterality: Left;   LOWER EXTREMITY ANGIOGRAPHY Left 09/03/2020   Procedure: LOWER EXTREMITY ANGIOGRAPHY;  Surgeon: Renford Dills, MD;  Location: [facility];  Service: Cardiovascular;  Laterality: Left;   LOWER EXTREMITY ANGIOGRAPHY Left 07/14/2021   Procedure: LOWER EXTREMITY ANGIOGRAPHY;  Surgeon: Annice Needy, MD;  Location: [facility];  Service: Cardiovascular;  Laterality: Left;   LOWER EXTREMITY ANGIOGRAPHY Left 07/15/2021   Procedure: Lower Extremity Angiography;  Surgeon: Renford Dills, MD;  Location: [facility];  Service: Cardiovascular;  Laterality: Left;   NECK SURGERY     THROMBECTOMY FEMORAL ARTERY Left 09/04/2020   Procedure: THROMBECTOMY FEMORAL ARTERY possible stent;  Surgeon: Renford Dills, MD;  Location: [facility];  Service: Vascular;  Laterality: Left;    Family History  Problem Relation Age of Onset   Cancer Father    Vascular  Disease Sister     Allergies  Allergen Reactions   Oxycodone-Acetaminophen Itching    CBC Latest Ref Rng & Units 07/15/2021 07/15/2021 07/15/2021  WBC 4.0 - 10.5 K/uL 8.1 7.1 8.2  Hemoglobin 13.0 - 17.0 g/dL 12.1(L) 12.4(L) 13.2  Hematocrit 39.0 - 52.0 % 33.6(L) 34.7(L) 36.5(L)  Platelets 150 - 400 K/uL 102(L) 125(L) 146(L)      CMP     Component Value Date/Time   NA 135 07/16/2021 0454   NA 141 03/16/2014 1656   K 3.5 07/16/2021 1336   K 4.0 03/16/2014 1656   CL 99 07/16/2021 0454   CL 106 03/16/2014 1656   CO2 30 07/16/2021 0454   CO2 29 03/16/2014 1656   GLUCOSE 124 (H) 07/16/2021 0454   GLUCOSE 79 03/16/2014 1656   BUN 13 07/16/2021 0454   BUN 16 03/16/2014 1656   CREATININE 1.07 07/16/2021 0454   CREATININE 0.97 03/16/2014 1656   CALCIUM 7.6 (L) 07/16/2021 0454   CALCIUM 8.9 03/16/2014 1656   PROT 5.8 (L) 09/09/2020 0541   PROT 7.6 03/16/2014 1656   ALBUMIN 2.4 (L) 09/09/2020 0541   ALBUMIN 3.7 03/16/2014 1656   AST 19 09/09/2020 0541   AST 25 03/16/2014 1656   ALT 16 09/09/2020 0541   ALT 43 03/16/2014 1656   ALKPHOS 32 (L) 09/09/2020 0541   ALKPHOS 68 03/16/2014 1656   BILITOT 0.6 09/09/2020 0541   BILITOT 0.3 03/16/2014 1656   GFRNONAA >60 07/16/2021 0454   GFRNONAA >60 03/16/2014 1656   GFRAA >60 03/16/2014 1656     VAS [place] ABI WITH/WO TBI  Result Date: 08/15/2021  LOWER EXTREMITY DOPPLER STUDY Patient Name:  Carl Norris  Date of Exam:   08/15/2021 Medical Rec #: [id number]       Accession #:    [id number] Date of Birth: 04-09-1956      Patient Gender: M Patient Age:   15 years Exam Location:   [facility] Procedure:      VAS [place] ABI WITH/WO TBI Referring Phys: Vivia Birmingham Felicite Zeimet --------------------------------------------------------------------------------  Indications: Rest pain.  Vascular Interventions: 08/27/2020: Crosser Atherectomy of the Left SFA and                         Popliteal Artery. PTA and Stent placement of the Left                          SFA and  Popliteal Artery. PTA of the Profunda Femoral                         Artery.                         09/03/2020 Left SFA thrombectomy with SFA and pop                         stents.                          09/04/2020: Endart Lt CFA, PFA and SFA. Lt SFA and                         Popliteal Artery Thrombectomy and Stents.                          07/14/2021: Mechanical Thrombectomy to the Left SFA and                         Popliteal Artery.                          07/15/2021: PTA and Stent Left SFA and Popliteal Artery. Comparison Study: 10/15/2020 Performing Technologist: Debbe Bales RVS  Examination Guidelines: A complete evaluation includes at minimum, Doppler waveform signals and systolic blood pressure reading at the level of bilateral brachial, anterior tibial, and posterior tibial arteries, when vessel segments are accessible. Bilateral testing is considered an integral part of a complete examination. Photoelectric Plethysmograph (PPG) waveforms and toe systolic pressure readings are included as required and additional duplex testing as needed. Limited examinations for reoccurring indications may be performed as noted.  ABI Findings: +---------+------------------+-----+--------+--------+ Right    Rt Pressure (mmHg)IndexWaveformComment  +---------+------------------+-----+--------+--------+ Brachial 127                                     +---------+------------------+-----+--------+--------+ ATA      79                     biphasic.60      +---------+------------------+-----+--------+--------+ PTA      86                0.65 biphasic         +---------+------------------+-----+--------+--------+ Great Toe101               0.77 Normal           +---------+------------------+-----+--------+--------+ +---------+------------------+-----+---------+-------+ Left     Lt Pressure (mmHg)IndexWaveform Comment  +---------+------------------+-----+---------+-------+ Brachial 132                                     +---------+------------------+-----+---------+-------+ ATA      138                    triphasic1.05    +---------+------------------+-----+---------+-------+ PTA      157               1.19 triphasic        +---------+------------------+-----+---------+-------+ Delphia Grates  1.22 Normal           +---------+------------------+-----+---------+-------+ +-------+-----------+-----------+------------+------------+ ABI/TBIToday's ABIToday's TBIPrevious ABIPrevious TBI +-------+-----------+-----------+------------+------------+ Right  .65        .77        .70         1.15         +-------+-----------+-----------+------------+------------+ Left   1.19       1.22       .94         1.61         +-------+-----------+-----------+------------+------------+ Left ABIs appear increased compared to prior study on 10/15/2020. Right ABIs appear decreased compared to prior study on 10/15/2020. Rt TBIs appear decreased compared to prior study on 10/15/2020. Lt TBIs appear essentially unchanged compared to prior study on 10/15/2020.  Summary: Right: Resting right ankle-brachial index indicates moderate right lower extremity arterial disease. The right toe-brachial index is normal. Left: Resting left ankle-brachial index is within normal range. No evidence of significant left lower extremity arterial disease. The left toe-brachial index is normal.  *See table(s) above for measurements and observations.  Electronically signed by Festus Barren MD on 08/15/2021 at 11:31:31 AM.    Final        Assessment & Plan:   1. Atherosclerosis of native artery of left lower extremity with rest pain (HCC)  Recommend:  The patient has evidence of atherosclerosis of the lower extremities with claudication.  The patient does not voice lifestyle limiting changes at this point in  time.  Noninvasive studies do not suggest clinically significant change.  No invasive studies, angiography or surgery at this time The patient should continue walking and begin a more formal exercise program.  The patient should continue antiplatelet therapy and aggressive treatment of the lipid abnormalities  No changes in the patient's medications at this time  The patient should continue wearing graduated compression socks 10-15 mmHg strength to control the mild edema.   - VAS [place] ABI WITH/WO TBI; Future  2. Tobacco user Smoking cessation was discussed, 3-10 minutes spent on this topic specifically   3. Type 2 diabetes mellitus with hyperglycemia, without long-term current use of insulin (HCC) Continue hypoglycemic medications as already ordered, these medications have been reviewed and there are no changes at this time.  Hgb A1C to be monitored as already arranged by primary service   4. Essential (primary) hypertension Continue antihypertensive medications as already ordered, these medications have been reviewed and there are no changes at this time.    Current Outpatient Medications on File Prior to Visit  Medication Sig Dispense Refill   aspirin EC 81 MG tablet Take 1 tablet (81 mg total) by mouth daily. Swallow whole. 150 tablet 2   atorvastatin (LIPITOR) 10 MG tablet Take 1 tablet (10 mg total) by mouth daily. 30 tablet 11   clopidogrel (PLAVIX) 75 MG tablet TAKE 1 TABLET BY MOUTH EVERY DAY 90 tablet 1   hydrochlorothiazide (HYDRODIURIL) 25 MG tablet Take 25 mg by mouth daily.      naproxen sodium (ALEVE) 220 MG tablet Take 440 mg by mouth daily.     sitaGLIPtin (JANUVIA) 100 MG tablet Take 100 mg by mouth daily.     HYDROcodone-acetaminophen (NORCO) 7.5-325 MG tablet Take 1-2 tablets by mouth every 6 (six) hours as needed for moderate pain or severe pain. 50 tablet 0   metoprolol succinate (TOPROL-XL) 100 MG 24 hr tablet Take 100 mg by mouth daily.      mupirocin ointment  (BACTROBAN) 2 % Apply  1 application topically 2 (two) times daily. 22 g 0   pantoprazole (PROTONIX) 40 MG tablet Take by mouth. (Patient not taking: No sig reported)     vitamin B-12 (CYANOCOBALAMIN) 1000 MCG tablet Take 1,000 mcg by mouth daily.     No current facility-administered medications on file prior to visit.    There are no Patient Instructions on file for this visit. Return in about 3 months (around 11/14/2021) for PAD; GS/FB.   Georgiana Spinner, NP

## 2021-08-28 ENCOUNTER — Encounter (INDEPENDENT_AMBULATORY_CARE_PROVIDER_SITE_OTHER): Payer: Self-pay

## 2021-08-28 ENCOUNTER — Other Ambulatory Visit (INDEPENDENT_AMBULATORY_CARE_PROVIDER_SITE_OTHER): Payer: Self-pay | Admitting: Nurse Practitioner

## 2021-09-15 ENCOUNTER — Ambulatory Visit (INDEPENDENT_AMBULATORY_CARE_PROVIDER_SITE_OTHER): Payer: BC Managed Care – PPO | Admitting: Vascular Surgery

## 2021-09-15 ENCOUNTER — Encounter (INDEPENDENT_AMBULATORY_CARE_PROVIDER_SITE_OTHER): Payer: BC Managed Care – PPO

## 2021-09-23 DIAGNOSIS — Z1159 Encounter for screening for other viral diseases: Secondary | ICD-10-CM | POA: Diagnosis not present

## 2021-09-23 DIAGNOSIS — E1165 Type 2 diabetes mellitus with hyperglycemia: Secondary | ICD-10-CM | POA: Diagnosis not present

## 2021-09-23 DIAGNOSIS — I10 Essential (primary) hypertension: Secondary | ICD-10-CM | POA: Diagnosis not present

## 2021-09-23 DIAGNOSIS — Z125 Encounter for screening for malignant neoplasm of prostate: Secondary | ICD-10-CM | POA: Diagnosis not present

## 2021-09-23 DIAGNOSIS — M79604 Pain in right leg: Secondary | ICD-10-CM | POA: Diagnosis not present

## 2021-09-23 DIAGNOSIS — D649 Anemia, unspecified: Secondary | ICD-10-CM | POA: Diagnosis not present

## 2021-09-23 DIAGNOSIS — Z114 Encounter for screening for human immunodeficiency virus [HIV]: Secondary | ICD-10-CM | POA: Diagnosis not present

## 2021-09-30 DIAGNOSIS — I70219 Atherosclerosis of native arteries of extremities with intermittent claudication, unspecified extremity: Secondary | ICD-10-CM | POA: Diagnosis not present

## 2021-09-30 DIAGNOSIS — Z0000 Encounter for general adult medical examination without abnormal findings: Secondary | ICD-10-CM | POA: Diagnosis not present

## 2021-09-30 DIAGNOSIS — Z720 Tobacco use: Secondary | ICD-10-CM | POA: Diagnosis not present

## 2021-09-30 DIAGNOSIS — Z23 Encounter for immunization: Secondary | ICD-10-CM | POA: Diagnosis not present

## 2021-09-30 DIAGNOSIS — E1165 Type 2 diabetes mellitus with hyperglycemia: Secondary | ICD-10-CM | POA: Diagnosis not present

## 2021-09-30 DIAGNOSIS — I739 Peripheral vascular disease, unspecified: Secondary | ICD-10-CM | POA: Diagnosis not present

## 2021-09-30 DIAGNOSIS — I10 Essential (primary) hypertension: Secondary | ICD-10-CM | POA: Diagnosis not present

## 2021-10-02 ENCOUNTER — Encounter: Payer: Self-pay | Admitting: *Deleted

## 2021-10-28 ENCOUNTER — Encounter (INDEPENDENT_AMBULATORY_CARE_PROVIDER_SITE_OTHER): Payer: Self-pay

## 2021-10-29 ENCOUNTER — Other Ambulatory Visit (INDEPENDENT_AMBULATORY_CARE_PROVIDER_SITE_OTHER): Payer: Self-pay | Admitting: Nurse Practitioner

## 2021-10-29 MED ORDER — GABAPENTIN 300 MG PO CAPS: 300.0000 mg | ORAL_CAPSULE | Freq: Three times a day (TID) | ORAL | 3 refills | Status: DC

## 2021-10-29 MED ORDER — ATORVASTATIN CALCIUM 10 MG PO TABS: 10.0000 mg | ORAL_TABLET | Freq: Every day | ORAL | 11 refills | Status: DC

## 2021-11-14 ENCOUNTER — Encounter (INDEPENDENT_AMBULATORY_CARE_PROVIDER_SITE_OTHER): Payer: Self-pay | Admitting: Nurse Practitioner

## 2021-11-14 ENCOUNTER — Ambulatory Visit (INDEPENDENT_AMBULATORY_CARE_PROVIDER_SITE_OTHER): Payer: BC Managed Care – PPO | Admitting: Nurse Practitioner

## 2021-11-14 ENCOUNTER — Ambulatory Visit (INDEPENDENT_AMBULATORY_CARE_PROVIDER_SITE_OTHER): Payer: BC Managed Care – PPO

## 2021-11-14 VITALS — BP 132/76 | HR 68 | Resp 16 | Wt 221.0 lb

## 2021-11-14 DIAGNOSIS — I10 Essential (primary) hypertension: Secondary | ICD-10-CM

## 2021-11-14 DIAGNOSIS — E1165 Type 2 diabetes mellitus with hyperglycemia: Secondary | ICD-10-CM | POA: Diagnosis not present

## 2021-11-14 DIAGNOSIS — I70213 Atherosclerosis of native arteries of extremities with intermittent claudication, bilateral legs: Secondary | ICD-10-CM

## 2021-11-14 DIAGNOSIS — I70222 Atherosclerosis of native arteries of extremities with rest pain, left leg: Secondary | ICD-10-CM

## 2021-11-14 DIAGNOSIS — Z720 Tobacco use: Secondary | ICD-10-CM | POA: Diagnosis not present

## 2021-11-15 ENCOUNTER — Encounter (INDEPENDENT_AMBULATORY_CARE_PROVIDER_SITE_OTHER): Payer: Self-pay | Admitting: Nurse Practitioner

## 2021-11-15 NOTE — Progress Notes (Signed)
Subjective:    Patient ID: Carl Norris, male    DOB: 1955/11/29, 65 y.o.   MRN: [id number] Chief Complaint  Patient presents with   Follow-up    Ultrasound follow up    Carl Norris is a 65 year old male that returns to the office for followup and review of the noninvasive studies.  Over the last several years Mr. Hervey Ard is underwent several interventions on his left lower extremity with a knot in his right.  There have been no interval changes in lower extremity symptoms, he continues to have claudication within the right.  No interval shortening of the patient's claudication distance or development of rest pain symptoms. No new ulcers or wounds have occurred since the last visit.  There have been no significant changes to the patient's overall health care.  The patient denies amaurosis fugax or recent TIA symptoms. There are no recent neurological changes noted. The patient denies history of DVT, PE or superficial thrombophlebitis. The patient denies recent episodes of angina or shortness of breath.   ABI Rt=0.69 and Lt=1.15  (previous ABI's Rt=0.65 and Lt=1.19) Duplex ultrasound of the left lower extremity reveals triphasic waveforms with good toe waveforms whereas the right has monophasic/biphasic waveforms with decreased toe waveforms.   Review of Systems  Cardiovascular:        Right lower extremity claudication   Skin:  Negative for wound.  All other systems reviewed and are negative.     Objective:   Physical Exam Vitals reviewed.  HENT:     Head: Normocephalic.  Cardiovascular:     Rate and Rhythm: Normal rate.  Pulmonary:     Effort: Pulmonary effort is normal.  Skin:    General: Skin is warm and dry.  Neurological:     Mental Status: He is alert and oriented to person, place, and time.  Psychiatric:        Mood and Affect: Mood normal.        Behavior: Behavior normal.        Thought Content: Thought content normal.        Judgment: Judgment normal.     BP 132/76 (BP Location: Left Arm)    Pulse 68    Resp 16    Wt 221 lb (100.2 kg)    BMI 31.71 kg/m   Past Medical History:  Diagnosis Date   Diabetes mellitus without complication (HCC)    Hypertension    Peripheral vascular disease (HCC)     Social History   Socioeconomic History   Marital status: Single    Spouse name: Not on file   Number of children: 0   Years of education: Not on file   Highest education level: Not on file  Occupational History   Not on file  Tobacco Use   Smoking status: Every Day    Packs/day: 2.00    Years: 47.00    Pack years: 94.00    Types: Cigarettes   Smokeless tobacco: Never   Tobacco comments:    SMOKED TODAY   Substance and Sexual Activity   Alcohol use: Yes    Comment: socially   Drug use: Never   Sexual activity: Not on file  Other Topics Concern   Not on file  Social History Narrative   Lives by himself    Social Determinants of Health   Financial Resource Strain: Not on file  Food Insecurity: Not on file  Transportation Needs: Not on file  Physical Activity: Not on file  Stress:  Not on file  Social Connections: Not on file  Intimate Partner Violence: Not on file    Past Surgical History:  Procedure Laterality Date   LOWER EXTREMITY ANGIOGRAPHY Left 08/27/2020   Procedure: LOWER EXTREMITY ANGIOGRAPHY;  Surgeon: Renford Dills, MD;  Location: [facility];  Service: Cardiovascular;  Laterality: Left;   LOWER EXTREMITY ANGIOGRAPHY Left 09/03/2020   Procedure: LOWER EXTREMITY ANGIOGRAPHY;  Surgeon: Renford Dills, MD;  Location: [facility];  Service: Cardiovascular;  Laterality: Left;   LOWER EXTREMITY ANGIOGRAPHY Left 07/14/2021   Procedure: LOWER EXTREMITY ANGIOGRAPHY;  Surgeon: Annice Needy, MD;  Location: [facility];  Service: Cardiovascular;  Laterality: Left;   LOWER EXTREMITY ANGIOGRAPHY Left 07/15/2021   Procedure: Lower Extremity Angiography;  Surgeon: Renford Dills, MD;   Location: [facility];  Service: Cardiovascular;  Laterality: Left;   NECK SURGERY     THROMBECTOMY FEMORAL ARTERY Left 09/04/2020   Procedure: THROMBECTOMY FEMORAL ARTERY possible stent;  Surgeon: Renford Dills, MD;  Location: [facility];  Service: Vascular;  Laterality: Left;    Family History  Problem Relation Age of Onset   Cancer Father    Vascular Disease Sister     Allergies  Allergen Reactions   Oxycodone-Acetaminophen Itching    CBC Latest Ref Rng & Units 07/15/2021 07/15/2021 07/15/2021  WBC 4.0 - 10.5 K/uL 8.1 7.1 8.2  Hemoglobin 13.0 - 17.0 g/dL 12.1(L) 12.4(L) 13.2  Hematocrit 39.0 - 52.0 % 33.6(L) 34.7(L) 36.5(L)  Platelets 150 - 400 K/uL 102(L) 125(L) 146(L)      CMP     Component Value Date/Time   NA 135 07/16/2021 0454   NA 141 03/16/2014 1656   K 3.5 07/16/2021 1336   K 4.0 03/16/2014 1656   CL 99 07/16/2021 0454   CL 106 03/16/2014 1656   CO2 30 07/16/2021 0454   CO2 29 03/16/2014 1656   GLUCOSE 124 (H) 07/16/2021 0454   GLUCOSE 79 03/16/2014 1656   BUN 13 07/16/2021 0454   BUN 16 03/16/2014 1656   CREATININE 1.07 07/16/2021 0454   CREATININE 0.97 03/16/2014 1656   CALCIUM 7.6 (L) 07/16/2021 0454   CALCIUM 8.9 03/16/2014 1656   PROT 5.8 (L) 09/09/2020 0541   PROT 7.6 03/16/2014 1656   ALBUMIN 2.4 (L) 09/09/2020 0541   ALBUMIN 3.7 03/16/2014 1656   AST 19 09/09/2020 0541   AST 25 03/16/2014 1656   ALT 16 09/09/2020 0541   ALT 43 03/16/2014 1656   ALKPHOS 32 (L) 09/09/2020 0541   ALKPHOS 68 03/16/2014 1656   BILITOT 0.6 09/09/2020 0541   BILITOT 0.3 03/16/2014 1656   GFRNONAA >60 07/16/2021 0454   GFRNONAA >60 03/16/2014 1656   GFRAA >60 03/16/2014 1656     No results found.     Assessment & Plan:   1. Atherosclerosis of native artery of both lower extremities with intermittent claudication (HCC)  Recommend:  The patient has evidence of atherosclerosis of the lower extremities with claudication.  The patient does not  voice lifestyle limiting changes at this point in time.  Noninvasive studies do not suggest clinically significant change.  No invasive studies, angiography or surgery at this time The patient should continue walking and begin a more formal exercise program.  The patient should continue antiplatelet therapy and aggressive treatment of the lipid abnormalities  No changes in the patient's medications at this time  The patient should continue wearing graduated compression socks 10-15 mmHg strength to  control the mild edema.    2. Type 2 diabetes mellitus with hyperglycemia, without long-term current use of insulin (HCC) Continue hypoglycemic medications as already ordered, these medications have been reviewed and there are no changes at this time.  Hgb A1C to be monitored as already arranged by primary service   3. Essential (primary) hypertension Continue antihypertensive medications as already ordered, these medications have been reviewed and there are no changes at this time.   4. Tobacco user Smoking cessation was discussed, 3-10 minutes spent on this topic specifically    Current Outpatient Medications on File Prior to Visit  Medication Sig Dispense Refill   atorvastatin (LIPITOR) 10 MG tablet Take 1 tablet (10 mg total) by mouth daily. 30 tablet 11   clopidogrel (PLAVIX) 75 MG tablet TAKE 1 TABLET BY MOUTH EVERY DAY 90 tablet 1   gabapentin (NEURONTIN) 300 MG capsule Take 1 capsule (300 mg total) by mouth 3 (three) times daily. (Patient taking differently: Take 900 mg by mouth daily. Take 300mg  in morning 600mg  in evening) 90 capsule 3   hydrochlorothiazide (HYDRODIURIL) 25 MG tablet Take 25 mg by mouth daily.      losartan (COZAAR) 25 MG tablet Take 25 mg by mouth daily.     metoprolol succinate (TOPROL-XL) 100 MG 24 hr tablet Take 100 mg by mouth daily.      pantoprazole (PROTONIX) 40 MG tablet Take by mouth.     sitaGLIPtin (JANUVIA) 100 MG tablet Take 100 mg by mouth daily.      HYDROcodone-acetaminophen (NORCO) 7.5-325 MG tablet Take 1-2 tablets by mouth every 6 (six) hours as needed for moderate pain or severe pain. 50 tablet 0   mupirocin ointment (BACTROBAN) 2 % Apply 1 application topically 2 (two) times daily. 22 g 0   naproxen sodium (ALEVE) 220 MG tablet Take 440 mg by mouth daily.     vitamin B-12 (CYANOCOBALAMIN) 1000 MCG tablet Take 1,000 mcg by mouth daily.     No current facility-administered medications on file prior to visit.    There are no Patient Instructions on file for this visit. No follow-ups on file.   Kris Hartmann, NP

## 2022-03-26 ENCOUNTER — Encounter (INDEPENDENT_AMBULATORY_CARE_PROVIDER_SITE_OTHER): Payer: Self-pay

## 2022-03-27 ENCOUNTER — Other Ambulatory Visit (INDEPENDENT_AMBULATORY_CARE_PROVIDER_SITE_OTHER): Payer: Self-pay | Admitting: Nurse Practitioner

## 2022-03-27 MED ORDER — GABAPENTIN 300 MG PO CAPS: 900.0000 mg | ORAL_CAPSULE | Freq: Every day | ORAL | 3 refills | Status: DC

## 2022-03-30 DIAGNOSIS — I70219 Atherosclerosis of native arteries of extremities with intermittent claudication, unspecified extremity: Secondary | ICD-10-CM | POA: Diagnosis not present

## 2022-03-30 DIAGNOSIS — E1165 Type 2 diabetes mellitus with hyperglycemia: Secondary | ICD-10-CM | POA: Diagnosis not present

## 2022-03-30 DIAGNOSIS — Z720 Tobacco use: Secondary | ICD-10-CM | POA: Diagnosis not present

## 2022-03-30 DIAGNOSIS — I10 Essential (primary) hypertension: Secondary | ICD-10-CM | POA: Diagnosis not present

## 2022-03-30 DIAGNOSIS — I739 Peripheral vascular disease, unspecified: Secondary | ICD-10-CM | POA: Diagnosis not present

## 2022-04-23 ENCOUNTER — Other Ambulatory Visit (INDEPENDENT_AMBULATORY_CARE_PROVIDER_SITE_OTHER): Payer: Self-pay | Admitting: Vascular Surgery

## 2022-04-23 DIAGNOSIS — Z9889 Other specified postprocedural states: Secondary | ICD-10-CM

## 2022-04-23 DIAGNOSIS — H2513 Age-related nuclear cataract, bilateral: Secondary | ICD-10-CM | POA: Diagnosis not present

## 2022-04-23 DIAGNOSIS — H353131 Nonexudative age-related macular degeneration, bilateral, early dry stage: Secondary | ICD-10-CM | POA: Diagnosis not present

## 2022-04-23 DIAGNOSIS — Z0100 Encounter for examination of eyes and vision without abnormal findings: Secondary | ICD-10-CM | POA: Diagnosis not present

## 2022-05-06 ENCOUNTER — Ambulatory Visit (INDEPENDENT_AMBULATORY_CARE_PROVIDER_SITE_OTHER): Payer: BC Managed Care – PPO

## 2022-05-06 ENCOUNTER — Ambulatory Visit (INDEPENDENT_AMBULATORY_CARE_PROVIDER_SITE_OTHER): Payer: BC Managed Care – PPO | Admitting: Nurse Practitioner

## 2022-05-06 ENCOUNTER — Encounter (INDEPENDENT_AMBULATORY_CARE_PROVIDER_SITE_OTHER): Payer: Self-pay | Admitting: Nurse Practitioner

## 2022-05-06 VITALS — BP 128/77 | HR 61 | Resp 17 | Ht 70.0 in | Wt 218.0 lb

## 2022-05-06 DIAGNOSIS — I70213 Atherosclerosis of native arteries of extremities with intermittent claudication, bilateral legs: Secondary | ICD-10-CM

## 2022-05-06 DIAGNOSIS — I739 Peripheral vascular disease, unspecified: Secondary | ICD-10-CM

## 2022-05-06 DIAGNOSIS — Z9889 Other specified postprocedural states: Secondary | ICD-10-CM | POA: Diagnosis not present

## 2022-05-06 DIAGNOSIS — I10 Essential (primary) hypertension: Secondary | ICD-10-CM | POA: Diagnosis not present

## 2022-05-06 DIAGNOSIS — Z720 Tobacco use: Secondary | ICD-10-CM

## 2022-05-06 DIAGNOSIS — E1165 Type 2 diabetes mellitus with hyperglycemia: Secondary | ICD-10-CM

## 2022-05-19 ENCOUNTER — Encounter (INDEPENDENT_AMBULATORY_CARE_PROVIDER_SITE_OTHER): Payer: Self-pay | Admitting: Nurse Practitioner

## 2022-05-19 NOTE — Progress Notes (Signed)
Subjective:    Patient ID: Carl Norris, male    DOB: 03-07-1956, 66 y.o.   MRN: [id number] No chief complaint on file.   The patient returns to the office for followup and review of the noninvasive studies.  The patient has had multiple interventions to his left lower extremity  There have been no interval changes in lower extremity symptoms. No interval shortening of the patient's claudication distance or development of rest pain symptoms. No new ulcers or wounds have occurred since the last visit.  There have been no significant changes to the patient's overall health care.  The patient denies amaurosis fugax or recent TIA symptoms. There are no documented recent neurological changes noted. There is no history of DVT, PE or superficial thrombophlebitis. The patient denies recent episodes of angina or shortness of breath.   ABI Rt=0.85 and Lt=1.21  (previous ABI's Rt=0.69 and Lt=1.15) Duplex ultrasound of the lower extremity shows biphasic waveforms throughout with a patent stent    Review of Systems  Skin:  Negative for wound.  All other systems reviewed and are negative.      Objective:   Physical Exam Vitals reviewed.  HENT:     Head: Normocephalic.  Cardiovascular:     Rate and Rhythm: Normal rate.     Pulses:          Dorsalis pedis pulses are detected w/ Doppler on the right side and detected w/ Doppler on the left side.       Posterior tibial pulses are detected w/ Doppler on the right side and detected w/ Doppler on the left side.  Pulmonary:     Effort: Pulmonary effort is normal.  Skin:    General: Skin is warm and dry.  Neurological:     Mental Status: He is alert and oriented to person, place, and time.  Psychiatric:        Mood and Affect: Mood normal.        Behavior: Behavior normal.        Thought Content: Thought content normal.        Judgment: Judgment normal.     BP 128/77 (BP Location: Right Arm)   Pulse 61   Resp 17   Ht 5\' 10"  (1.778  m)   Wt 218 lb (98.9 kg)   BMI 31.28 kg/m   Past Medical History:  Diagnosis Date   Diabetes mellitus without complication (HCC)    Hypertension    Peripheral vascular disease (HCC)     Social History   Socioeconomic History   Marital status: Single    Spouse name: Not on file   Number of children: 0   Years of education: Not on file   Highest education level: Not on file  Occupational History   Not on file  Tobacco Use   Smoking status: Every Day    Packs/day: 2.00    Years: 47.00    Total pack years: 94.00    Types: Cigarettes   Smokeless tobacco: Never   Tobacco comments:    SMOKED TODAY   Substance and Sexual Activity   Alcohol use: Yes    Comment: socially   Drug use: Never   Sexual activity: Not on file  Other Topics Concern   Not on file  Social History Narrative   Lives by himself    Social Determinants of Health   Financial Resource Strain: Not on file  Food Insecurity: Not on file  Transportation Needs: Not on file  Physical  Activity: Not on file  Stress: Not on file  Social Connections: Not on file  Intimate Partner Violence: Not on file    Past Surgical History:  Procedure Laterality Date   LOWER EXTREMITY ANGIOGRAPHY Left 08/27/2020   Procedure: LOWER EXTREMITY ANGIOGRAPHY;  Surgeon: Renford Dills, MD;  Location: [facility];  Service: Cardiovascular;  Laterality: Left;   LOWER EXTREMITY ANGIOGRAPHY Left 09/03/2020   Procedure: LOWER EXTREMITY ANGIOGRAPHY;  Surgeon: Renford Dills, MD;  Location: [facility];  Service: Cardiovascular;  Laterality: Left;   LOWER EXTREMITY ANGIOGRAPHY Left 07/14/2021   Procedure: LOWER EXTREMITY ANGIOGRAPHY;  Surgeon: Annice Needy, MD;  Location: [facility];  Service: Cardiovascular;  Laterality: Left;   LOWER EXTREMITY ANGIOGRAPHY Left 07/15/2021   Procedure: Lower Extremity Angiography;  Surgeon: Renford Dills, MD;  Location: [facility];  Service:  Cardiovascular;  Laterality: Left;   NECK SURGERY     THROMBECTOMY FEMORAL ARTERY Left 09/04/2020   Procedure: THROMBECTOMY FEMORAL ARTERY possible stent;  Surgeon: Renford Dills, MD;  Location: [facility];  Service: Vascular;  Laterality: Left;    Family History  Problem Relation Age of Onset   Cancer Father    Vascular Disease Sister     Allergies  Allergen Reactions   Oxycodone-Acetaminophen Itching       Latest Ref Rng & Units 07/15/2021   12:17 PM 07/15/2021    6:06 AM 07/15/2021   12:18 AM  CBC  WBC 4.0 - 10.5 K/uL 8.1  7.1  8.2   Hemoglobin 13.0 - 17.0 g/dL 13.2  44.0  10.2   Hematocrit 39.0 - 52.0 % 33.6  34.7  36.5   Platelets 150 - 400 K/uL 102  125  146       CMP     Component Value Date/Time   NA 135 07/16/2021 0454   NA 141 03/16/2014 1656   K 3.5 07/16/2021 1336   K 4.0 03/16/2014 1656   CL 99 07/16/2021 0454   CL 106 03/16/2014 1656   CO2 30 07/16/2021 0454   CO2 29 03/16/2014 1656   GLUCOSE 124 (H) 07/16/2021 0454   GLUCOSE 79 03/16/2014 1656   BUN 13 07/16/2021 0454   BUN 16 03/16/2014 1656   CREATININE 1.07 07/16/2021 0454   CREATININE 0.97 03/16/2014 1656   CALCIUM 7.6 (L) 07/16/2021 0454   CALCIUM 8.9 03/16/2014 1656   PROT 5.8 (L) 09/09/2020 0541   PROT 7.6 03/16/2014 1656   ALBUMIN 2.4 (L) 09/09/2020 0541   ALBUMIN 3.7 03/16/2014 1656   AST 19 09/09/2020 0541   AST 25 03/16/2014 1656   ALT 16 09/09/2020 0541   ALT 43 03/16/2014 1656   ALKPHOS 32 (L) 09/09/2020 0541   ALKPHOS 68 03/16/2014 1656   BILITOT 0.6 09/09/2020 0541   BILITOT 0.3 03/16/2014 1656   GFRNONAA >60 07/16/2021 0454   GFRNONAA >60 03/16/2014 1656   GFRAA >60 03/16/2014 1656     VAS [place] ABI WITH/WO TBI  Result Date: 05/07/2022  LOWER EXTREMITY DOPPLER STUDY Patient Name:  Carl Norris  Date of Exam:   05/06/2022 Medical Rec #: [id number]       Accession #:    [id number] Date of Birth: 06-15-1956      Patient Gender: M Patient Age:   66 years Exam Location:   [facility] Procedure:      VAS [place] ABI WITH/WO TBI Referring Phys: --------------------------------------------------------------------------------  Indications: Rest pain.  Vascular  Interventions: 08/27/2020: Crosser Atherectomy of the Left SFA and                         Popliteal Artery. PTA and Stent placement of the Left                         SFA and Popliteal Artery. PTA of the Profunda Femoral                         Artery.                         09/03/2020 Left SFA thrombectomy with SFA and pop                         stents.                          09/04/2020: Endart Lt CFA, PFA and SFA. Lt SFA and                         Popliteal Artery Thrombectomy and Stents.                          07/14/2021: Mechanical Thrombectomy to the Left SFA and                         Popliteal Artery.                          07/15/2021: PTA and Stent Left SFA and Popliteal Artery. Performing Technologist: Salvadore Farber RVT  Examination Guidelines: A complete evaluation includes at minimum, Doppler waveform signals and systolic blood pressure reading at the level of bilateral brachial, anterior tibial, and posterior tibial arteries, when vessel segments are accessible. Bilateral testing is considered an integral part of a complete examination. Photoelectric Plethysmograph (PPG) waveforms and toe systolic pressure readings are included as required and additional duplex testing as needed. Limited examinations for reoccurring indications may be performed as noted.  ABI Findings: +---------+------------------+-----+----------+--------+ Right    Rt Pressure (mmHg)IndexWaveform  Comment  +---------+------------------+-----+----------+--------+ Brachial 140                                       +---------+------------------+-----+----------+--------+ ATA      113               0.81 monophasic         +---------+------------------+-----+----------+--------+ PTA      119               0.85  biphasic           +---------+------------------+-----+----------+--------+ Great Toe91                0.65 Normal             +---------+------------------+-----+----------+--------+ +---------+------------------+-----+---------+-------+ Left     Lt Pressure (mmHg)IndexWaveform Comment +---------+------------------+-----+---------+-------+ Brachial 140                                     +---------+------------------+-----+---------+-------+ ATA  169               1.21 triphasic        +---------+------------------+-----+---------+-------+ PTA      166               1.19 triphasic        +---------+------------------+-----+---------+-------+ Great Toe137               0.98 Normal           +---------+------------------+-----+---------+-------+ +-------+-----------+-----------+------------+------------+ ABI/TBIToday's ABIToday's TBIPrevious ABIPrevious TBI +-------+-----------+-----------+------------+------------+ Right  .85        .65        .69         .49          +-------+-----------+-----------+------------+------------+ Left   1.21       .98        1.15        1.13         +-------+-----------+-----------+------------+------------+ Right ABIs and TBIs appear increased compared to prior study on 11/18/2021.  Summary: Right: Resting right ankle-brachial index indicates mild right lower extremity arterial disease. The right toe-brachial index is abnormal. Left: Resting left ankle-brachial index is within normal range. No evidence of significant left lower extremity arterial disease. The left toe-brachial index is normal. *See table(s) above for measurements and observations.  Electronically signed by Levora DredgeGregory Schnier MD on 05/07/2022 at 6:23:24 PM.    Final        Assessment & Plan:   1. Atherosclerosis of native artery of both lower extremities with intermittent claudication (HCC)  Recommend:  The patient has evidence of atherosclerosis of the  lower extremities with claudication.  The patient does not voice lifestyle limiting changes at this point in time.  Noninvasive studies do not suggest clinically significant change.  No invasive studies, angiography or surgery at this time The patient should continue walking and begin a more formal exercise program.  The patient should continue antiplatelet therapy and aggressive treatment of the lipid abnormalities  No changes in the patient's medications at this time  Continued surveillance is indicated as atherosclerosis is likely to progress with time.    The patient will continue follow up with noninvasive studies as ordered.    2. Type 2 diabetes mellitus with hyperglycemia, without long-term current use of insulin (HCC) Continue hypoglycemic medications as already ordered, these medications have been reviewed and there are no changes at this time.  Hgb A1C to be monitored as already arranged by primary service   3. Essential (primary) hypertension Continue antihypertensive medications as already ordered, these medications have been reviewed and there are no changes at this time.   4. Tobacco user Smoking cessation was discussed, 3-10 minutes spent on this topic specifically    Current Outpatient Medications on File Prior to Visit  Medication Sig Dispense Refill   atorvastatin (LIPITOR) 10 MG tablet Take 1 tablet (10 mg total) by mouth daily. 30 tablet 11   clopidogrel (PLAVIX) 75 MG tablet TAKE 1 TABLET BY MOUTH EVERY DAY 90 tablet 1   gabapentin (NEURONTIN) 300 MG capsule Take 3 capsules (900 mg total) by mouth daily. Take 300mg  in morning 600mg  in evening 270 capsule 3   hydrochlorothiazide (HYDRODIURIL) 25 MG tablet Take 25 mg by mouth daily.      losartan (COZAAR) 25 MG tablet Take 25 mg by mouth daily.     pantoprazole (PROTONIX) 40 MG tablet Take by mouth.     sitaGLIPtin (JANUVIA) 100 MG tablet Take  100 mg by mouth daily.     metoprolol succinate (TOPROL-XL) 100 MG 24  hr tablet Take 100 mg by mouth daily.      pantoprazole (PROTONIX) 40 MG tablet Take by mouth.     No current facility-administered medications on file prior to visit.    There are no Patient Instructions on file for this visit. No follow-ups on file.   Georgiana Spinner, NP

## 2022-06-08 DIAGNOSIS — H2512 Age-related nuclear cataract, left eye: Secondary | ICD-10-CM | POA: Diagnosis not present

## 2022-06-18 ENCOUNTER — Encounter: Payer: Self-pay | Admitting: Ophthalmology

## 2022-06-22 ENCOUNTER — Other Ambulatory Visit (INDEPENDENT_AMBULATORY_CARE_PROVIDER_SITE_OTHER): Payer: Self-pay | Admitting: Vascular Surgery

## 2022-06-23 NOTE — Discharge Instructions (Signed)

## 2022-06-23 NOTE — Anesthesia Preprocedure Evaluation (Signed)
Anesthesia Evaluation  Patient identified by MRN, date of birth, ID band Patient awake    Reviewed: Allergy & Precautions, NPO status , Patient's Chart, lab work & pertinent test results  History of Anesthesia Complications Negative for: history of anesthetic complications  Airway Mallampati: III  TM Distance: >3 FB Neck ROM: full    Dental no notable dental hx.    Pulmonary Current SmokerPatient did not abstain from smoking.,    Pulmonary exam normal        Cardiovascular hypertension, Pt. on medications and Pt. on home beta blockers + Peripheral Vascular Disease  Normal cardiovascular exam+ dysrhythmias (c/o palpitations)  Rhythm:Irregular Rate:Normal     Neuro/Psych negative neurological ROS  negative psych ROS   GI/Hepatic negative GI ROS, (+)     substance abuse  alcohol use,   Endo/Other  diabetes, Well Controlled, Type 2  Renal/GU      Musculoskeletal   Abdominal (+) + obese,   Peds  Hematology negative hematology ROS (+)   Anesthesia Other Findings Past Medical History: No date: Diabetes mellitus without complication (HCC) No date: Hypertension No date: Peripheral vascular disease ([place])  Past Surgical History: 08/27/2020: LOWER EXTREMITY ANGIOGRAPHY; Left     Comment:  Procedure: LOWER EXTREMITY ANGIOGRAPHY;  Surgeon:               Katha Cabal, MD;  Location: [facility];               Service: Cardiovascular;  Laterality: Left; 09/03/2020: LOWER EXTREMITY ANGIOGRAPHY; Left     Comment:  Procedure: LOWER EXTREMITY ANGIOGRAPHY;  Surgeon:               Katha Cabal, MD;  Location: [facility];               Service: Cardiovascular;  Laterality: Left; 07/14/2021: LOWER EXTREMITY ANGIOGRAPHY; Left     Comment:  Procedure: LOWER EXTREMITY ANGIOGRAPHY;  Surgeon: Algernon Huxley, MD;  Location: [facility];  Service:               Cardiovascular;   Laterality: Left; 07/15/2021: LOWER EXTREMITY ANGIOGRAPHY; Left     Comment:  Procedure: Lower Extremity Angiography;  Surgeon:               Katha Cabal, MD;  Location: [facility];               Service: Cardiovascular;  Laterality: Left; No date: NECK SURGERY 09/04/2020: THROMBECTOMY FEMORAL ARTERY; Left     Comment:  Procedure: THROMBECTOMY FEMORAL ARTERY possible stent;                Surgeon: Katha Cabal, MD;  Location: [facility];                Service: Vascular;  Laterality: Left;  BMI    Body Mass Index: 32.00 kg/m      Reproductive/Obstetrics negative OB ROS                            Anesthesia Physical Anesthesia Plan  ASA: 3  Anesthesia Plan: MAC   Post-op Pain Management: Minimal or no pain anticipated   Induction: Intravenous  PONV Risk Score and Plan: Treatment may vary due to age or medical condition  Airway Management Planned: Natural Airway  Additional Equipment:   Intra-op Plan:   Post-operative Plan:   Informed Consent: I have reviewed the patients History and Physical, chart, labs and discussed the procedure including the risks, benefits and alternatives for the proposed anesthesia with the patient or authorized representative who has indicated his/her understanding and acceptance.       Plan Discussed with: Anesthesiologist, CRNA and Surgeon  Anesthesia Plan Comments: (Discussed patient irregular heart rate with history of palpitations. Pt denies chest pain, fatigue, SOB, syncope, or decreased activity level. He was instructed to see his PCP for EKG and evaluation. )       Anesthesia Quick Evaluation

## 2022-06-24 ENCOUNTER — Ambulatory Visit: Payer: BC Managed Care – PPO | Admitting: Anesthesiology

## 2022-06-24 ENCOUNTER — Other Ambulatory Visit: Payer: Self-pay

## 2022-06-24 ENCOUNTER — Encounter: Payer: Self-pay | Admitting: Ophthalmology

## 2022-06-24 ENCOUNTER — Encounter
Admission: RE | Disposition: A | Discharge status: Home / Self Care | Payer: Self-pay | Source: Home / Self Care | Attending: Ophthalmology

## 2022-06-24 ENCOUNTER — Ambulatory Visit
Admission: RE | Admit: 2022-06-24 | Discharge: 2022-06-24 | Disposition: A | Discharge status: Home / Self Care | Payer: BC Managed Care – PPO | Attending: Ophthalmology | Admitting: Ophthalmology

## 2022-06-24 DIAGNOSIS — I10 Essential (primary) hypertension: Secondary | ICD-10-CM | POA: Diagnosis not present

## 2022-06-24 DIAGNOSIS — Z79899 Other long term (current) drug therapy: Secondary | ICD-10-CM | POA: Diagnosis not present

## 2022-06-24 DIAGNOSIS — E1151 Type 2 diabetes mellitus with diabetic peripheral angiopathy without gangrene: Secondary | ICD-10-CM | POA: Diagnosis not present

## 2022-06-24 DIAGNOSIS — E1165 Type 2 diabetes mellitus with hyperglycemia: Secondary | ICD-10-CM

## 2022-06-24 DIAGNOSIS — H2512 Age-related nuclear cataract, left eye: Secondary | ICD-10-CM | POA: Insufficient documentation

## 2022-06-24 DIAGNOSIS — E1136 Type 2 diabetes mellitus with diabetic cataract: Secondary | ICD-10-CM | POA: Insufficient documentation

## 2022-06-24 HISTORY — PX: CATARACT EXTRACTION W/PHACO: SHX586

## 2022-06-24 LAB — GLUCOSE, CAPILLARY
Glucose-Capillary: 148 mg/dL — ABNORMAL HIGH (ref 70–99)
Glucose-Capillary: 128 mg/dL — ABNORMAL HIGH (ref 70–99)

## 2022-06-24 SURGERY — PHACOEMULSIFICATION, CATARACT, WITH IOL INSERTION
Anesthesia: Monitor Anesthesia Care | Site: Eye | Laterality: Left

## 2022-06-24 MED ORDER — FENTANYL CITRATE (PF) 100 MCG/2ML IJ SOLN
INTRAMUSCULAR | Status: DC | PRN
  Administered 2022-06-24: 50 ug via INTRAVENOUS

## 2022-06-24 MED ORDER — LACTATED RINGERS IV SOLN: INTRAVENOUS | Status: DC

## 2022-06-24 MED ORDER — TETRACAINE HCL 0.5 % OP SOLN
1.0000 [drp] | OPHTHALMIC | Status: DC | PRN
  Administered 2022-06-24 (×3): 1 [drp] via OPHTHALMIC

## 2022-06-24 MED ORDER — SIGHTPATH DOSE#1 BSS IO SOLN
INTRAOCULAR | Status: DC | PRN
  Administered 2022-06-24: 1 mL

## 2022-06-24 MED ORDER — CEFUROXIME OPHTHALMIC INJECTION 1 MG/0.1 ML
INJECTION | OPHTHALMIC | Status: DC | PRN
  Administered 2022-06-24: 0.1 mL via INTRACAMERAL

## 2022-06-24 MED ORDER — BRIMONIDINE TARTRATE-TIMOLOL 0.2-0.5 % OP SOLN
OPHTHALMIC | Status: DC | PRN
  Administered 2022-06-24: 1 [drp] via OPHTHALMIC

## 2022-06-24 MED ORDER — SIGHTPATH DOSE#1 BSS IO SOLN
INTRAOCULAR | Status: DC | PRN
  Administered 2022-06-24: 15 mL

## 2022-06-24 MED ORDER — SIGHTPATH DOSE#1 NA HYALUR & NA CHOND-NA HYALUR IO KIT
PACK | INTRAOCULAR | Status: DC | PRN
  Administered 2022-06-24: 1 via OPHTHALMIC

## 2022-06-24 MED ORDER — MIDAZOLAM HCL 2 MG/2ML IJ SOLN
INTRAMUSCULAR | Status: DC | PRN
  Administered 2022-06-24 (×2): 1 mg via INTRAVENOUS

## 2022-06-24 MED ORDER — SIGHTPATH DOSE#1 BSS IO SOLN
INTRAOCULAR | Status: DC | PRN
  Administered 2022-06-24: 65 mL via OPHTHALMIC

## 2022-06-24 MED ORDER — ARMC OPHTHALMIC DILATING DROPS
1.0000 | OPHTHALMIC | Status: DC | PRN
  Administered 2022-06-24 (×3): 1 via OPHTHALMIC

## 2022-06-24 SURGICAL SUPPLY — 21 items
CANNULA ANT/CHMB 27G (MISCELLANEOUS) IMPLANT
CANNULA ANT/CHMB 27GA (MISCELLANEOUS) IMPLANT
CATARACT SUITE SIGHTPATH (MISCELLANEOUS) ×2 IMPLANT
FEE CATARACT SUITE SIGHTPATH (MISCELLANEOUS) ×1 IMPLANT
GLOVE SRG 8 PF TXTR STRL LF DI (GLOVE) ×1 IMPLANT
GLOVE SURG ENC TEXT LTX SZ7.5 (GLOVE) ×2 IMPLANT
GLOVE SURG GAMMEX PI TX LF 7.5 (GLOVE) IMPLANT
GLOVE SURG UNDER POLY LF SZ8 (GLOVE) ×2
LENS IOL TECNIS EYHANCE 22.5 (Intraocular Lens) ×1 IMPLANT
NDL FILTER BLUNT 18X1 1/2 (NEEDLE) ×1 IMPLANT
NDL RETROBULBAR .5 NSTRL (NEEDLE) IMPLANT
NEEDLE FILTER BLUNT 18X 1/2SAF (NEEDLE) ×1
NEEDLE FILTER BLUNT 18X1 1/2 (NEEDLE) ×1 IMPLANT
PACK VIT ANT 23G (MISCELLANEOUS) IMPLANT
RING MALYGIN 7.0 (MISCELLANEOUS) IMPLANT
SUT ETHILON 10-0 CS-B-6CS-B-6 (SUTURE)
SUT VICRYL  9 0 (SUTURE)
SUT VICRYL 9 0 (SUTURE) IMPLANT
SUTURE EHLN 10-0 CS-B-6CS-B-6 (SUTURE) IMPLANT
SYR 3ML LL SCALE MARK (SYRINGE) ×2 IMPLANT
WATER STERILE IRR 250ML POUR (IV SOLUTION) ×2 IMPLANT

## 2022-06-24 NOTE — Anesthesia Postprocedure Evaluation (Signed)
Anesthesia Post Note  Patient: Carl Norris  Procedure(s) Performed: CATARACT EXTRACTION PHACO AND INTRAOCULAR LENS PLACEMENT (IOC) LEFT DIABETIC (Left: Eye)     Patient location during evaluation: PACU Anesthesia Type: MAC Level of consciousness: awake and alert Pain management: pain level controlled Vital Signs Assessment: post-procedure vital signs reviewed and stable Respiratory status: spontaneous breathing, nonlabored ventilation and respiratory function stable Cardiovascular status: blood pressure returned to baseline and stable Postop Assessment: no apparent nausea or vomiting Anesthetic complications: no   No notable events documented.  [place] Ouch

## 2022-06-24 NOTE — Transfer of Care (Signed)
Immediate Anesthesia Transfer of Care Note  Patient: Carl Norris  Procedure(s) Performed: CATARACT EXTRACTION PHACO AND INTRAOCULAR LENS PLACEMENT (IOC) LEFT DIABETIC (Left: Eye)  Patient Location: PACU  Anesthesia Type: MAC  Level of Consciousness: awake, alert  and patient cooperative  Airway and Oxygen Therapy: Patient Spontanous Breathing and Patient connected to supplemental oxygen  Post-op Assessment: Post-op Vital signs reviewed, Patient's Cardiovascular Status Stable, Respiratory Function Stable, Patent Airway and No signs of Nausea or vomiting  Post-op Vital Signs: Reviewed and stable  Complications: No notable events documented.

## 2022-06-24 NOTE — Op Note (Signed)
OPERATIVE NOTE  Carl Norris [id number] 06/24/2022   PREOPERATIVE DIAGNOSIS:  Nuclear sclerotic cataract left eye. H25.12   POSTOPERATIVE DIAGNOSIS:    Nuclear sclerotic cataract left eye.     PROCEDURE:  Phacoemusification with posterior chamber intraocular lens placement of the left eye  Ultrasound time: Procedure(s) with comments: CATARACT EXTRACTION PHACO AND INTRAOCULAR LENS PLACEMENT (IOC) LEFT DIABETIC (Left) - 7.32 1:15.4  LENS:   Implant Name Type Inv. Item Serial No. Manufacturer Lot No. LRB No. Used Action  LENS IOL TECNIS EYHANCE 22.5 - [id number] Intraocular Lens LENS IOL TECNIS EYHANCE 22.5 [id number] [employer]  Left 1 Implanted      SURGEON:  Deirdre Evener, MD   ANESTHESIA:  Topical with tetracaine drops and 2% Xylocaine jelly, augmented with 1% preservative-free intracameral lidocaine.    COMPLICATIONS:  None.   DESCRIPTION OF PROCEDURE:  The patient was identified in the holding room and transported to the operating room and placed in the supine position under the operating microscope.  The left eye was identified as the operative eye and it was prepped and draped in the usual sterile ophthalmic fashion.   A 1 millimeter clear-corneal paracentesis was made at the 1:30 position.  0.5 ml of preservative-free 1% lidocaine was injected into the anterior chamber.  The anterior chamber was filled with Viscoat viscoelastic.  A 2.4 millimeter keratome was used to make a near-clear corneal incision at the 10:30 position.  .  A curvilinear capsulorrhexis was made with a cystotome and capsulorrhexis forceps.  Balanced salt solution was used to hydrodissect and hydrodelineate the nucleus.   Phacoemulsification was then used in stop and chop fashion to remove the lens nucleus and epinucleus.  The remaining cortex was then removed using the irrigation and aspiration handpiece. Provisc was then placed into the capsular bag to distend it for lens placement.  A lens was then  injected into the capsular bag.  The remaining viscoelastic was aspirated.   Wounds were hydrated with balanced salt solution.  The anterior chamber was inflated to a physiologic pressure with balanced salt solution.  No wound leaks were noted. Cefuroxime 0.1 ml of a 10mg /ml solution was injected into the anterior chamber for a dose of 1 mg of intracameral antibiotic at the completion of the case.   Timolol and Brimonidine drops were applied to the eye.  The patient was taken to the recovery room in stable condition without complications of anesthesia or surgery.  [name] 06/24/2022, 10:26 AM

## 2022-06-24 NOTE — Anesthesia Procedure Notes (Signed)
Procedure Name: MAC Date/Time: 06/24/2022 10:07 AM  Performed by: Jerrye Noble, CRNAPre-anesthesia Checklist: Patient identified, Emergency Drugs available, Suction available and Patient being monitored Patient Re-evaluated:Patient Re-evaluated prior to induction Oxygen Delivery Method: Nasal cannula

## 2022-06-24 NOTE — H&P (Signed)
[facility]   Primary Care Physician:  Mick Sell, MD Ophthalmologist: Dr. Lockie Mola  Pre-Procedure History & Physical: HPI:  Carl Norris is a 66 y.o. male here for ophthalmic surgery.   Past Medical History:  Diagnosis Date   Diabetes mellitus without complication (HCC)    Hypertension    Peripheral vascular disease (HCC)     Past Surgical History:  Procedure Laterality Date   LOWER EXTREMITY ANGIOGRAPHY Left 08/27/2020   Procedure: LOWER EXTREMITY ANGIOGRAPHY;  Surgeon: Renford Dills, MD;  Location: [facility];  Service: Cardiovascular;  Laterality: Left;   LOWER EXTREMITY ANGIOGRAPHY Left 09/03/2020   Procedure: LOWER EXTREMITY ANGIOGRAPHY;  Surgeon: Renford Dills, MD;  Location: [facility];  Service: Cardiovascular;  Laterality: Left;   LOWER EXTREMITY ANGIOGRAPHY Left 07/14/2021   Procedure: LOWER EXTREMITY ANGIOGRAPHY;  Surgeon: Annice Needy, MD;  Location: [facility];  Service: Cardiovascular;  Laterality: Left;   LOWER EXTREMITY ANGIOGRAPHY Left 07/15/2021   Procedure: Lower Extremity Angiography;  Surgeon: Renford Dills, MD;  Location: [facility];  Service: Cardiovascular;  Laterality: Left;   NECK SURGERY     THROMBECTOMY FEMORAL ARTERY Left 09/04/2020   Procedure: THROMBECTOMY FEMORAL ARTERY possible stent;  Surgeon: Renford Dills, MD;  Location: [facility];  Service: Vascular;  Laterality: Left;    Prior to Admission medications   Medication Sig Start Date End Date Taking? Authorizing Provider  ASPIRIN 81 PO Take by mouth daily.   Yes [provider]  atorvastatin (LIPITOR) 10 MG tablet Take 1 tablet (10 mg total) by mouth daily. 10/29/21 10/29/22 Yes Georgiana Spinner, NP  clopidogrel (PLAVIX) 75 MG tablet TAKE 1 TABLET BY MOUTH EVERY DAY 06/22/22  Yes Georgiana Spinner, NP  gabapentin (NEURONTIN) 300 MG capsule Take 3 capsules (900 mg total) by mouth daily. Take 300mg  in morning  600mg  in evening 03/27/22  Yes , NP  hydrochlorothiazide (HYDRODIURIL) 25 MG tablet Take 25 mg by mouth daily.  06/26/20  Yes [provider]  losartan (COZAAR) 25 MG tablet Take 25 mg by mouth daily. 09/30/21  Yes [provider]  metoprolol succinate (TOPROL-XL) 100 MG 24 hr tablet Take 100 mg by mouth daily.  06/26/20 06/24/22 Yes [provider]  pantoprazole (PROTONIX) 40 MG tablet Take by mouth. 11/27/21 06/24/22 Yes [provider]  sitaGLIPtin (JANUVIA) 100 MG tablet Take 100 mg by mouth daily.   Yes [provider]    Allergies as of 04/27/2022 - Review Complete 11/15/2021  Allergen Reaction Noted   Oxycodone-acetaminophen Itching 10/19/2019    Family History  Problem Relation Age of Onset   Cancer Father    Vascular Disease Sister     Social History   Socioeconomic History   Marital status: Single    Spouse name: Not on file   Number of children: 0   Years of education: Not on file   Highest education level: Not on file  Occupational History   Not on file  Tobacco Use   Smoking status: Every Day    Packs/day: 2.00    Years: 47.00    Total pack years: 94.00    Types: Cigarettes   Smokeless tobacco: Never   Tobacco comments:    SMOKED TODAY   Vaping Use   Vaping Use: Never used  Substance and Sexual Activity   Alcohol use: Yes    Comment: socially   Drug use: Never  Sexual activity: Not on file  Other Topics Concern   Not on file  Social History Narrative   Lives by himself    Social Determinants of Health   Financial Resource Strain: Not on file  Food Insecurity: Not on file  Transportation Needs: Not on file  Physical Activity: Not on file  Stress: Not on file  Social Connections: Not on file  Intimate Partner Violence: Not on file    Review of Systems: See HPI, otherwise negative ROS  Physical Exam: BP 126/65   Pulse 68   Temp 97.9 F (36.6 C) (Temporal)   Resp 18   Ht 5\' 10"  (1.778  m)   Wt 99.8 kg   SpO2 96%   BMI 31.57 kg/m  General:   Alert,  pleasant and cooperative in NAD Head:  Normocephalic and atraumatic. Lungs:  Clear to auscultation.   Wheezes bilat  Heart:  Regular rate and rhythm.   Impression/Plan: Carl Norris is here for ophthalmic surgery.  Risks, benefits, limitations, and alternatives regarding ophthalmic surgery have been reviewed with the patient.  Questions have been answered.  All parties agreeable.   Randa Spike, MD  06/24/2022, 9:34 AM

## 2022-06-25 ENCOUNTER — Encounter: Payer: Self-pay | Admitting: Ophthalmology

## 2022-09-14 ENCOUNTER — Encounter (INDEPENDENT_AMBULATORY_CARE_PROVIDER_SITE_OTHER): Payer: Self-pay

## 2022-09-30 DIAGNOSIS — E1165 Type 2 diabetes mellitus with hyperglycemia: Secondary | ICD-10-CM | POA: Diagnosis not present

## 2022-09-30 DIAGNOSIS — Z0000 Encounter for general adult medical examination without abnormal findings: Secondary | ICD-10-CM | POA: Diagnosis not present

## 2022-09-30 DIAGNOSIS — Z720 Tobacco use: Secondary | ICD-10-CM | POA: Diagnosis not present

## 2022-09-30 DIAGNOSIS — I10 Essential (primary) hypertension: Secondary | ICD-10-CM | POA: Diagnosis not present

## 2022-09-30 DIAGNOSIS — I739 Peripheral vascular disease, unspecified: Secondary | ICD-10-CM | POA: Diagnosis not present

## 2022-10-18 ENCOUNTER — Other Ambulatory Visit (INDEPENDENT_AMBULATORY_CARE_PROVIDER_SITE_OTHER): Payer: Self-pay | Admitting: Nurse Practitioner

## 2022-10-21 ENCOUNTER — Encounter (INDEPENDENT_AMBULATORY_CARE_PROVIDER_SITE_OTHER): Payer: Self-pay

## 2022-10-23 ENCOUNTER — Other Ambulatory Visit (INDEPENDENT_AMBULATORY_CARE_PROVIDER_SITE_OTHER): Payer: Self-pay

## 2022-10-23 MED ORDER — ATORVASTATIN CALCIUM 10 MG PO TABS
10.0000 mg | ORAL_TABLET | Freq: Every day | ORAL | 11 refills | Status: DC
Start: 2022-10-23 — End: 2023-09-20

## 2023-03-18 ENCOUNTER — Other Ambulatory Visit (INDEPENDENT_AMBULATORY_CARE_PROVIDER_SITE_OTHER): Payer: Self-pay | Admitting: Nurse Practitioner

## 2023-05-03 ENCOUNTER — Other Ambulatory Visit (INDEPENDENT_AMBULATORY_CARE_PROVIDER_SITE_OTHER): Payer: Self-pay | Admitting: Nurse Practitioner

## 2023-05-03 DIAGNOSIS — I739 Peripheral vascular disease, unspecified: Secondary | ICD-10-CM

## 2023-05-04 ENCOUNTER — Encounter (INDEPENDENT_AMBULATORY_CARE_PROVIDER_SITE_OTHER): Payer: Self-pay | Admitting: Vascular Surgery

## 2023-05-04 ENCOUNTER — Ambulatory Visit (INDEPENDENT_AMBULATORY_CARE_PROVIDER_SITE_OTHER): Payer: BC Managed Care – PPO

## 2023-05-04 ENCOUNTER — Ambulatory Visit (INDEPENDENT_AMBULATORY_CARE_PROVIDER_SITE_OTHER): Payer: BC Managed Care – PPO | Admitting: Vascular Surgery

## 2023-05-04 VITALS — BP 115/62 | HR 74 | Resp 18 | Ht 70.0 in | Wt 223.0 lb

## 2023-05-04 DIAGNOSIS — Z720 Tobacco use: Secondary | ICD-10-CM | POA: Diagnosis not present

## 2023-05-04 DIAGNOSIS — Z9889 Other specified postprocedural states: Secondary | ICD-10-CM

## 2023-05-04 DIAGNOSIS — I739 Peripheral vascular disease, unspecified: Secondary | ICD-10-CM | POA: Diagnosis not present

## 2023-05-04 DIAGNOSIS — E1165 Type 2 diabetes mellitus with hyperglycemia: Secondary | ICD-10-CM

## 2023-05-04 DIAGNOSIS — I70211 Atherosclerosis of native arteries of extremities with intermittent claudication, right leg: Secondary | ICD-10-CM | POA: Diagnosis not present

## 2023-05-04 DIAGNOSIS — I10 Essential (primary) hypertension: Secondary | ICD-10-CM

## 2023-05-04 NOTE — Progress Notes (Unsigned)
MRN : [id number]  Carl Norris is a 67 y.o. ([birth year]) male who presents with chief complaint of  Chief Complaint  Patient presents with   Follow-up    6 MONTH ABI SEE  .  History of Present Illness: Patient returns today in follow up of his PAD.  He has undergone extensive left lower extremity revascularization over the years with a combination of endovascular and open procedures.  This has resulted in marked improvement in his left leg symptoms with no real current claudication.  He does not have rest pain or ulcerations.  He continues to have claudication on the right and this seems to be worsening over time.  He does not have rest pain or ulceration but his claudication is getting to the point where it is limiting his daily activities and bothers him most every time he walks.  Inclines or hills are essentially impossible at this point.  He remains on dual antiplatelet therapy and Lipitor.  He does continue to smoke.  His noninvasive studies today show a right ABI of 0.64 and the left ABI of 1.07.  Current Outpatient Medications  Medication Sig Dispense Refill   ASPIRIN 81 PO Take by mouth daily.     atorvastatin (LIPITOR) 10 MG tablet Take 1 tablet (10 mg total) by mouth daily. 30 tablet 11   clopidogrel (PLAVIX) 75 MG tablet TAKE 1 TABLET BY MOUTH EVERY DAY 90 tablet 3   gabapentin (NEURONTIN) 300 MG capsule TAKE 1 CAPSULE EVERY MORNING AND 2 CAPSULES EVERY EVENING 270 capsule 3   hydrochlorothiazide (HYDRODIURIL) 25 MG tablet Take 25 mg by mouth daily.      losartan (COZAAR) 25 MG tablet Take 25 mg by mouth daily.     metoprolol succinate (TOPROL-XL) 100 MG 24 hr tablet Take 100 mg by mouth daily.      sitaGLIPtin (JANUVIA) 100 MG tablet Take 100 mg by mouth daily.     pantoprazole (PROTONIX) 40 MG tablet Take by mouth. (Patient not taking: Reported on 05/04/2023)     No current facility-administered medications for this visit.    Past Medical History:  Diagnosis Date    Diabetes mellitus without complication (HCC)    Hypertension    Peripheral vascular disease [facility])     Past Surgical History:  Procedure Laterality Date   CATARACT EXTRACTION W/PHACO Left 06/24/2022   Procedure: CATARACT EXTRACTION PHACO AND INTRAOCULAR LENS PLACEMENT (IOC) LEFT DIABETIC;  Surgeon: Lockie Mola, MD;  Location: [facility];  Service: Ophthalmology;  Laterality: Left;  7.32 1:15.4   LOWER EXTREMITY ANGIOGRAPHY Left 08/27/2020   Procedure: LOWER EXTREMITY ANGIOGRAPHY;  Surgeon: Renford Dills, MD;  Location: [facility];  Service: Cardiovascular;  Laterality: Left;   LOWER EXTREMITY ANGIOGRAPHY Left 09/03/2020   Procedure: LOWER EXTREMITY ANGIOGRAPHY;  Surgeon: Renford Dills, MD;  Location: [facility];  Service: Cardiovascular;  Laterality: Left;   LOWER EXTREMITY ANGIOGRAPHY Left 07/14/2021   Procedure: LOWER EXTREMITY ANGIOGRAPHY;  Surgeon: Annice Needy, MD;  Location: [facility];  Service: Cardiovascular;  Laterality: Left;   LOWER EXTREMITY ANGIOGRAPHY Left 07/15/2021   Procedure: Lower Extremity Angiography;  Surgeon: Renford Dills, MD;  Location: [facility];  Service: Cardiovascular;  Laterality: Left;   NECK SURGERY     THROMBECTOMY FEMORAL ARTERY Left 09/04/2020   Procedure: THROMBECTOMY FEMORAL ARTERY possible stent;  Surgeon: Renford Dills, MD;  Location: [facility];  Service: Vascular;  Laterality: Left;  Social History   Tobacco Use   Smoking status: Every Day    Packs/day: 2.00    Years: 47.00    Additional pack years: 0.00    Total pack years: 94.00    Types: Cigarettes   Smokeless tobacco: Never   Tobacco comments:    SMOKED TODAY   Vaping Use   Vaping Use: Never used  Substance Use Topics   Alcohol use: Yes    Comment: socially   Drug use: Never       Family History  Problem Relation Age of Onset   Cancer Father    Vascular Disease Sister   No bleeding or clotting  disorders  Allergies  Allergen Reactions   Oxycodone-Acetaminophen Itching     REVIEW OF SYSTEMS (Negative unless checked)  Constitutional: [] Weight loss  [] Fever  [] Chills Cardiac: [] Chest pain   [] Chest pressure   [] Palpitations   [] Shortness of breath when laying flat   [] Shortness of breath at rest   [] Shortness of breath with exertion. Vascular:  [x] Pain in legs with walking   [] Pain in legs at rest   [] Pain in legs when laying flat   [x] Claudication   [] Pain in feet when walking  [] Pain in feet at rest  [] Pain in feet when laying flat   [] History of DVT   [] Phlebitis   [] Swelling in legs   [] Varicose veins   [] Non-healing ulcers Pulmonary:   [] Uses home oxygen   [] Productive cough   [] Hemoptysis   [] Wheeze  [] COPD   [] Asthma Neurologic:  [] Dizziness  [] Blackouts   [] Seizures   [] History of stroke   [] History of TIA  [] Aphasia   [] Temporary blindness   [] Dysphagia   [] Weakness or numbness in arms   [] Weakness or numbness in legs Musculoskeletal:  [x] Arthritis   [] Joint swelling   [x] Joint pain   [] Low back pain Hematologic:  [] Easy bruising  [] Easy bleeding   [] Hypercoagulable state   [] Anemic   Gastrointestinal:  [] Blood in stool   [] Vomiting blood  [] Gastroesophageal reflux/heartburn   [] Abdominal pain Genitourinary:  [] Chronic kidney disease   [] Difficult urination  [] Frequent urination  [] Burning with urination   [] Hematuria Skin:  [] Rashes   [] Ulcers   [] Wounds Psychological:  [] History of anxiety   []  History of major depression.  Physical Examination  BP 115/62 (BP Location: Left Arm)   Pulse 74   Resp 18   Ht 5\' 10"  (1.778 m)   Wt 223 lb (101.2 kg)   BMI 32.00 kg/m  Gen:  WD/WN, NAD Head: [place]/AT, No temporalis wasting. Ear/Nose/Throat: Hearing grossly intact, nares w/o erythema or drainage Eyes: Conjunctiva clear. Sclera non-icteric Neck: Supple.  Trachea midline Pulmonary:  Good air movement, no use of accessory muscles.  Cardiac: RRR, no JVD Vascular:  Vessel  Right Left  Radial Palpable Palpable                          PT 1+ Palpable 2+ Palpable  DP 1+ Palpable 2+ Palpable   Gastrointestinal: soft, non-tender/non-distended. No guarding/reflex.  Musculoskeletal: M/S 5/5 throughout.  No deformity or atrophy. No edema. Neurologic: Sensation grossly intact in extremities.  Symmetrical.  Speech is fluent.  Psychiatric: Judgment intact, Mood & affect appropriate for pt's clinical situation. Dermatologic: No rashes or ulcers noted.  No cellulitis or open wounds.      Labs No results found for this or any previous visit (from the past 2160 hour(s)).  Radiology No results found.  Assessment/Plan  Essential (primary) hypertension blood pressure control important in reducing the progression of atherosclerotic disease. On appropriate oral medications.   Type 2 diabetes mellitus with hyperglycemia, without long-term current use of insulin (HCC) blood glucose control important in reducing the progression of atherosclerotic disease. Also, involved in wound healing. On appropriate medications.   Tobacco user This represents an atherosclerotic risk factor for progression of disease.  Also increases the risk of recurrent disease.  Smoking cessation would be of benefit.  Atherosclerosis of native arteries of extremity with intermittent claudication (HCC) His noninvasive studies today show a right ABI of 0.64 and the left ABI of 1.07. We had a long talk about his claudication symptoms.  He is interested in having his right lower extremity evaluated and treated for disabling claudication symptoms.  He has some upcoming things in the next several weeks and would like to do this later on in the summer.  As long as he does not have limb threatening symptoms, that is certainly reasonable.  I discussed the procedure of angiography with possible revascularization in detail.  We discussed that some findings would require open surgical therapy or  combination of open surgical therapy and endovascular therapy, and sometimes we can perform concomitant treatment at the time of the angiography.  Patient voices understanding and is agreeable to proceed.    Festus Barren, MD  05/05/2023 1:27 PM    This note was created with Dragon medical transcription system.  Any errors from dictation are purely unintentional

## 2023-05-04 NOTE — H&P (View-Only) (Signed)
  MRN : [id number]  Carl Norris is a 66 y.o. (10/04/1956) male who presents with chief complaint of  Chief Complaint  Patient presents with   Follow-up    6 MONTH ABI SEE  .  History of Present Illness: Patient returns today in follow up of his PAD.  He has undergone extensive left lower extremity revascularization over the years with a combination of endovascular and open procedures.  This has resulted in marked improvement in his left leg symptoms with no real current claudication.  He does not have rest pain or ulcerations.  He continues to have claudication on the right and this seems to be worsening over time.  He does not have rest pain or ulceration but his claudication is getting to the point where it is limiting his daily activities and bothers him most every time he walks.  Inclines or hills are essentially impossible at this point.  He remains on dual antiplatelet therapy and Lipitor.  He does continue to smoke.  His noninvasive studies today show a right ABI of 0.64 and the left ABI of 1.07.  Current Outpatient Medications  Medication Sig Dispense Refill   ASPIRIN 81 PO Take by mouth daily.     atorvastatin (LIPITOR) 10 MG tablet Take 1 tablet (10 mg total) by mouth daily. 30 tablet 11   clopidogrel (PLAVIX) 75 MG tablet TAKE 1 TABLET BY MOUTH EVERY DAY 90 tablet 3   gabapentin (NEURONTIN) 300 MG capsule TAKE 1 CAPSULE EVERY MORNING AND 2 CAPSULES EVERY EVENING 270 capsule 3   hydrochlorothiazide (HYDRODIURIL) 25 MG tablet Take 25 mg by mouth daily.      losartan (COZAAR) 25 MG tablet Take 25 mg by mouth daily.     metoprolol succinate (TOPROL-XL) 100 MG 24 hr tablet Take 100 mg by mouth daily.      sitaGLIPtin (JANUVIA) 100 MG tablet Take 100 mg by mouth daily.     pantoprazole (PROTONIX) 40 MG tablet Take by mouth. (Patient not taking: Reported on 05/04/2023)     No current facility-administered medications for this visit.    Past Medical History:  Diagnosis Date    Diabetes mellitus without complication (HCC)    Hypertension    Peripheral vascular disease (HCC)     Past Surgical History:  Procedure Laterality Date   CATARACT EXTRACTION W/PHACO Left 06/24/2022   Procedure: CATARACT EXTRACTION PHACO AND INTRAOCULAR LENS PLACEMENT (IOC) LEFT DIABETIC;  Surgeon: Brasington, Chadwick, MD;  Location: [facility];  Service: Ophthalmology;  Laterality: Left;  7.32 1:15.4   LOWER EXTREMITY ANGIOGRAPHY Left 08/27/2020   Procedure: LOWER EXTREMITY ANGIOGRAPHY;  Surgeon: Schnier, Gregory G, MD;  Location: [facility];  Service: Cardiovascular;  Laterality: Left;   LOWER EXTREMITY ANGIOGRAPHY Left 09/03/2020   Procedure: LOWER EXTREMITY ANGIOGRAPHY;  Surgeon: Schnier, Gregory G, MD;  Location: [facility];  Service: Cardiovascular;  Laterality: Left;   LOWER EXTREMITY ANGIOGRAPHY Left 07/14/2021   Procedure: LOWER EXTREMITY ANGIOGRAPHY;  Surgeon: Makarios Madlock S, MD;  Location: [facility];  Service: Cardiovascular;  Laterality: Left;   LOWER EXTREMITY ANGIOGRAPHY Left 07/15/2021   Procedure: Lower Extremity Angiography;  Surgeon: Schnier, Gregory G, MD;  Location: [facility];  Service: Cardiovascular;  Laterality: Left;   NECK SURGERY     THROMBECTOMY FEMORAL ARTERY Left 09/04/2020   Procedure: THROMBECTOMY FEMORAL ARTERY possible stent;  Surgeon: Schnier, Gregory G, MD;  Location: [facility];  Service: Vascular;  Laterality: Left;       Social History   Tobacco Use   Smoking status: Every Day    Packs/day: 2.00    Years: 47.00    Additional pack years: 0.00    Total pack years: 94.00    Types: Cigarettes   Smokeless tobacco: Never   Tobacco comments:    SMOKED TODAY   Vaping Use   Vaping Use: Never used  Substance Use Topics   Alcohol use: Yes    Comment: socially   Drug use: Never       Family History  Problem Relation Age of Onset   Cancer Father    Vascular Disease Sister   No bleeding or clotting  disorders  Allergies  Allergen Reactions   Oxycodone-Acetaminophen Itching     REVIEW OF SYSTEMS (Negative unless checked)  Constitutional: []Weight loss  []Fever  []Chills Cardiac: []Chest pain   []Chest pressure   []Palpitations   []Shortness of breath when laying flat   []Shortness of breath at rest   []Shortness of breath with exertion. Vascular:  [x]Pain in legs with walking   []Pain in legs at rest   []Pain in legs when laying flat   [x]Claudication   []Pain in feet when walking  []Pain in feet at rest  []Pain in feet when laying flat   []History of DVT   []Phlebitis   []Swelling in legs   []Varicose veins   []Non-healing ulcers Pulmonary:   []Uses home oxygen   []Productive cough   []Hemoptysis   []Wheeze  []COPD   []Asthma Neurologic:  []Dizziness  []Blackouts   []Seizures   []History of stroke   []History of TIA  []Aphasia   []Temporary blindness   []Dysphagia   []Weakness or numbness in arms   []Weakness or numbness in legs Musculoskeletal:  [x]Arthritis   []Joint swelling   [x]Joint pain   []Low back pain Hematologic:  []Easy bruising  []Easy bleeding   []Hypercoagulable state   []Anemic   Gastrointestinal:  []Blood in stool   []Vomiting blood  []Gastroesophageal reflux/heartburn   []Abdominal pain Genitourinary:  []Chronic kidney disease   []Difficult urination  []Frequent urination  []Burning with urination   []Hematuria Skin:  []Rashes   []Ulcers   []Wounds Psychological:  []History of anxiety   [] History of major depression.  Physical Examination  BP 115/62 (BP Location: Left Arm)   Pulse 74   Resp 18   Ht 5' 10" (1.778 m)   Wt 223 lb (101.2 kg)   BMI 32.00 kg/m  Gen:  WD/WN, NAD Head: [place]/AT, No temporalis wasting. Ear/Nose/Throat: Hearing grossly intact, nares w/o erythema or drainage Eyes: Conjunctiva clear. Sclera non-icteric Neck: Supple.  Trachea midline Pulmonary:  Good air movement, no use of accessory muscles.  Cardiac: RRR, no JVD Vascular:  Vessel  Right Left  Radial Palpable Palpable                          PT 1+ Palpable 2+ Palpable  DP 1+ Palpable 2+ Palpable   Gastrointestinal: soft, non-tender/non-distended. No guarding/reflex.  Musculoskeletal: M/S 5/5 throughout.  No deformity or atrophy. No edema. Neurologic: Sensation grossly intact in extremities.  Symmetrical.  Speech is fluent.  Psychiatric: Judgment intact, Mood & affect appropriate for pt's clinical situation. Dermatologic: No rashes or ulcers noted.  No cellulitis or open wounds.      Labs No results found for this or any previous visit (from the past 2160 hour(s)).  Radiology No results found.  Assessment/Plan    Essential (primary) hypertension blood pressure control important in reducing the progression of atherosclerotic disease. On appropriate oral medications.   Type 2 diabetes mellitus with hyperglycemia, without long-term current use of insulin (HCC) blood glucose control important in reducing the progression of atherosclerotic disease. Also, involved in wound healing. On appropriate medications.   Tobacco user This represents an atherosclerotic risk factor for progression of disease.  Also increases the risk of recurrent disease.  Smoking cessation would be of benefit.  Atherosclerosis of native arteries of extremity with intermittent claudication (HCC) His noninvasive studies today show a right ABI of 0.64 and the left ABI of 1.07. We had a long talk about his claudication symptoms.  He is interested in having his right lower extremity evaluated and treated for disabling claudication symptoms.  He has some upcoming things in the next several weeks and would like to do this later on in the summer.  As long as he does not have limb threatening symptoms, that is certainly reasonable.  I discussed the procedure of angiography with possible revascularization in detail.  We discussed that some findings would require open surgical therapy or  combination of open surgical therapy and endovascular therapy, and sometimes we can perform concomitant treatment at the time of the angiography.  Patient voices understanding and is agreeable to proceed.    Aryahna Spagna, MD  05/05/2023 1:27 PM    This note was created with Dragon medical transcription system.  Any errors from dictation are purely unintentional 

## 2023-05-05 NOTE — Assessment & Plan Note (Signed)
This represents an atherosclerotic risk factor for progression of disease.  Also increases the risk of recurrent disease.  Smoking cessation would be of benefit.

## 2023-05-05 NOTE — Assessment & Plan Note (Signed)
blood pressure control important in reducing the progression of atherosclerotic disease. On appropriate oral medications.  

## 2023-05-05 NOTE — Assessment & Plan Note (Signed)
blood glucose control important in reducing the progression of atherosclerotic disease. Also, involved in wound healing. On appropriate medications.  

## 2023-05-05 NOTE — Assessment & Plan Note (Signed)
His noninvasive studies today show a right ABI of 0.64 and the left ABI of 1.07. We had a long talk about his claudication symptoms.  He is interested in having his right lower extremity evaluated and treated for disabling claudication symptoms.  He has some upcoming things in the next several weeks and would like to do this later on in the summer.  As long as he does not have limb threatening symptoms, that is certainly reasonable.  I discussed the procedure of angiography with possible revascularization in detail.  We discussed that some findings would require open surgical therapy or combination of open surgical therapy and endovascular therapy, and sometimes we can perform concomitant treatment at the time of the angiography.  Patient voices understanding and is agreeable to proceed.

## 2023-05-06 LAB — VAS US ABI WITH/WO TBI
Left ABI: 1.07
Right ABI: 0.64

## 2023-05-14 ENCOUNTER — Telehealth (INDEPENDENT_AMBULATORY_CARE_PROVIDER_SITE_OTHER): Payer: Self-pay

## 2023-05-14 NOTE — Telephone Encounter (Signed)
Spoke with the patient and he is scheduled with Dr. Wyn Quaker on 05/27/23 with a 12:00 pm arrival time to the [facility] for a right leg angio. Pre-procedure instructions were discussed and will be sent to Mychart and mailed.

## 2023-05-27 ENCOUNTER — Ambulatory Visit
Admission: RE | Admit: 2023-05-27 | Discharge: 2023-05-27 | Disposition: A | Discharge status: Home / Self Care | Payer: PPO | Attending: Vascular Surgery | Admitting: Vascular Surgery

## 2023-05-27 ENCOUNTER — Encounter
Admission: RE | Disposition: A | Discharge status: Home / Self Care | Payer: Self-pay | Source: Home / Self Care | Attending: Vascular Surgery

## 2023-05-27 ENCOUNTER — Other Ambulatory Visit: Payer: Self-pay

## 2023-05-27 ENCOUNTER — Encounter: Payer: Self-pay | Admitting: Vascular Surgery

## 2023-05-27 DIAGNOSIS — Z9889 Other specified postprocedural states: Secondary | ICD-10-CM

## 2023-05-27 DIAGNOSIS — F17210 Nicotine dependence, cigarettes, uncomplicated: Secondary | ICD-10-CM | POA: Insufficient documentation

## 2023-05-27 DIAGNOSIS — I70211 Atherosclerosis of native arteries of extremities with intermittent claudication, right leg: Secondary | ICD-10-CM | POA: Insufficient documentation

## 2023-05-27 DIAGNOSIS — I743 Embolism and thrombosis of arteries of the lower extremities: Secondary | ICD-10-CM | POA: Diagnosis not present

## 2023-05-27 DIAGNOSIS — Z79899 Other long term (current) drug therapy: Secondary | ICD-10-CM | POA: Diagnosis not present

## 2023-05-27 DIAGNOSIS — E1165 Type 2 diabetes mellitus with hyperglycemia: Secondary | ICD-10-CM | POA: Insufficient documentation

## 2023-05-27 DIAGNOSIS — Z7902 Long term (current) use of antithrombotics/antiplatelets: Secondary | ICD-10-CM | POA: Insufficient documentation

## 2023-05-27 DIAGNOSIS — I10 Essential (primary) hypertension: Secondary | ICD-10-CM | POA: Diagnosis not present

## 2023-05-27 DIAGNOSIS — I70219 Atherosclerosis of native arteries of extremities with intermittent claudication, unspecified extremity: Secondary | ICD-10-CM

## 2023-05-27 DIAGNOSIS — E1151 Type 2 diabetes mellitus with diabetic peripheral angiopathy without gangrene: Secondary | ICD-10-CM | POA: Insufficient documentation

## 2023-05-27 HISTORY — PX: LOWER EXTREMITY ANGIOGRAPHY: CATH118251

## 2023-05-27 LAB — BUN: BUN: 18 mg/dL (ref 8–23)

## 2023-05-27 LAB — CREATININE, SERUM
Creatinine, Ser: 1.26 mg/dL — ABNORMAL HIGH (ref 0.61–1.24)
GFR, Estimated: >60 mL/min (ref >60)

## 2023-05-27 SURGERY — LOWER EXTREMITY ANGIOGRAPHY
Anesthesia: Moderate Sedation | Site: Leg Lower | Laterality: Right

## 2023-05-27 MED ORDER — HYDROMORPHONE HCL 1 MG/ML IJ SOLN: 1.0000 mg | Freq: Once | INTRAMUSCULAR | Status: DC | PRN

## 2023-05-27 MED ORDER — ACETAMINOPHEN 325 MG PO TABS: 650.0000 mg | ORAL_TABLET | ORAL | Status: DC | PRN

## 2023-05-27 MED ORDER — MIDAZOLAM HCL 2 MG/ML PO SYRP
8.0000 mg | ORAL_SOLUTION | Freq: Once | ORAL | Status: AC | PRN
  Administered 2023-05-27: 8 mg via ORAL

## 2023-05-27 MED ORDER — SODIUM CHLORIDE 0.9% FLUSH: 3.0000 mL | INTRAVENOUS | Status: DC | PRN

## 2023-05-27 MED ORDER — DIPHENHYDRAMINE HCL 50 MG/ML IJ SOLN
INTRAMUSCULAR | Status: AC
  Filled 2023-05-27: qty 1

## 2023-05-27 MED ORDER — HEPARIN (PORCINE) IN NACL 1000-0.9 UT/500ML-% IV SOLN
INTRAVENOUS | Status: DC | PRN
  Administered 2023-05-27: 1000 mL

## 2023-05-27 MED ORDER — SODIUM CHLORIDE 0.9 % IV SOLN: INTRAVENOUS | Status: DC

## 2023-05-27 MED ORDER — IODIXANOL 320 MG/ML IV SOLN
INTRAVENOUS | Status: DC | PRN
  Administered 2023-05-27: 50 mL via INTRA_ARTERIAL

## 2023-05-27 MED ORDER — MIDAZOLAM HCL 2 MG/ML PO SYRP
ORAL_SOLUTION | ORAL | Status: AC
  Filled 2023-05-27: qty 5

## 2023-05-27 MED ORDER — CEFAZOLIN SODIUM-DEXTROSE 2-4 GM/100ML-% IV SOLN
2.0000 g | INTRAVENOUS | Status: AC
  Administered 2023-05-27: 2 g via INTRAVENOUS

## 2023-05-27 MED ORDER — DIPHENHYDRAMINE HCL 50 MG/ML IJ SOLN: 50.0000 mg | Freq: Once | INTRAMUSCULAR | Status: DC | PRN

## 2023-05-27 MED ORDER — SODIUM CHLORIDE 0.9 % IV SOLN: 250.0000 mL | INTRAVENOUS | Status: DC | PRN

## 2023-05-27 MED ORDER — LIDOCAINE-EPINEPHRINE (PF) 1 %-1:200000 IJ SOLN
INTRAMUSCULAR | Status: DC | PRN
  Administered 2023-05-27: 10 mL via INTRADERMAL

## 2023-05-27 MED ORDER — DIPHENHYDRAMINE HCL 50 MG/ML IJ SOLN
INTRAMUSCULAR | Status: DC | PRN
  Administered 2023-05-27: 50 mg via INTRAVENOUS

## 2023-05-27 MED ORDER — MIDAZOLAM HCL 5 MG/5ML IJ SOLN
INTRAMUSCULAR | Status: AC
  Filled 2023-05-27: qty 5

## 2023-05-27 MED ORDER — FAMOTIDINE 20 MG PO TABS: 40.0000 mg | ORAL_TABLET | Freq: Once | ORAL | Status: DC | PRN

## 2023-05-27 MED ORDER — ONDANSETRON HCL 4 MG/2ML IJ SOLN: 4.0000 mg | Freq: Four times a day (QID) | INTRAMUSCULAR | Status: DC | PRN

## 2023-05-27 MED ORDER — HEPARIN SODIUM (PORCINE) 10000 UNIT/ML IJ SOLN
INTRAMUSCULAR | Status: AC
  Filled 2023-05-27: qty 1

## 2023-05-27 MED ORDER — SODIUM CHLORIDE 0.9% FLUSH: 3.0000 mL | Freq: Two times a day (BID) | INTRAVENOUS | Status: DC

## 2023-05-27 MED ORDER — CEFAZOLIN SODIUM-DEXTROSE 2-4 GM/100ML-% IV SOLN
INTRAVENOUS | Status: AC
  Filled 2023-05-27: qty 100

## 2023-05-27 MED ORDER — HEPARIN SODIUM (PORCINE) 1000 UNIT/ML IJ SOLN
INTRAMUSCULAR | Status: DC | PRN
  Administered 2023-05-27: 6000 [IU] via INTRAVENOUS

## 2023-05-27 MED ORDER — LABETALOL HCL 5 MG/ML IV SOLN: 10.0000 mg | INTRAVENOUS | Status: DC | PRN

## 2023-05-27 MED ORDER — FENTANYL CITRATE (PF) 100 MCG/2ML IJ SOLN
INTRAMUSCULAR | Status: DC | PRN
  Administered 2023-05-27: 25 ug via INTRAVENOUS

## 2023-05-27 MED ORDER — FENTANYL CITRATE (PF) 100 MCG/2ML IJ SOLN
INTRAMUSCULAR | Status: DC | PRN
  Administered 2023-05-27: 50 ug via INTRAVENOUS

## 2023-05-27 MED ORDER — HEPARIN SODIUM (PORCINE) 1000 UNIT/ML IJ SOLN
INTRAMUSCULAR | Status: AC
  Filled 2023-05-27: qty 10

## 2023-05-27 MED ORDER — FENTANYL CITRATE (PF) 100 MCG/2ML IJ SOLN
INTRAMUSCULAR | Status: AC
  Filled 2023-05-27: qty 2

## 2023-05-27 MED ORDER — METHYLPREDNISOLONE SODIUM SUCC 125 MG IJ SOLR: 125.0000 mg | Freq: Once | INTRAMUSCULAR | Status: DC | PRN

## 2023-05-27 MED ORDER — HYDRALAZINE HCL 20 MG/ML IJ SOLN: 5.0000 mg | INTRAMUSCULAR | Status: DC | PRN

## 2023-05-27 MED ORDER — MIDAZOLAM HCL 2 MG/2ML IJ SOLN
INTRAMUSCULAR | Status: DC | PRN
  Administered 2023-05-27: 1 mg via INTRAVENOUS

## 2023-05-27 SURGICAL SUPPLY — 20 items
BALLN LUTONIX 018 5X220X130 (BALLOONS) ×1
BALLN LUTONIX 018 6X300X130 (BALLOONS) ×1
BALLOON LUTONIX 018 5X220X130 (BALLOONS) IMPLANT
BALLOON LUTONIX 018 6X300X130 (BALLOONS) IMPLANT
CANNULA 5F STIFF (CANNULA) IMPLANT
CATH ANGIO 5F PIGTAIL 65CM (CATHETERS) IMPLANT
CATH BEACON 5 .038 100 VERT TP (CATHETERS) IMPLANT
CATH ROTAREX 135 6FR (CATHETERS) IMPLANT
DEVICE STARCLOSE SE CLOSURE (Vascular Products) IMPLANT
GLIDEWIRE ADV .035X260CM (WIRE) IMPLANT
GUIDEWIRE SUPER STIFF .035X180 (WIRE) IMPLANT
KIT ENCORE 26 ADVANTAGE (KITS) IMPLANT
PACK ANGIOGRAPHY (CUSTOM PROCEDURE TRAY) ×1 IMPLANT
SHEATH ANL2 6FRX45 HC (SHEATH) IMPLANT
SHEATH BRITE TIP 5FRX11 (SHEATH) IMPLANT
STENT LIFESTENT 7X150X130 (Permanent Stent) IMPLANT
SYR MEDRAD MARK 7 150ML (SYRINGE) IMPLANT
TUBING CONTRAST HIGH PRESS 72 (TUBING) IMPLANT
WIRE G V18X300CM (WIRE) IMPLANT
WIRE GUIDERIGHT .035X150 (WIRE) IMPLANT

## 2023-05-27 NOTE — Discharge Instructions (Signed)
If you don't hear from [facility] by mid day Friday 05/28/23 then call office for follow up appointment 4 weeks with ABI's with Sheppard Plumber

## 2023-05-27 NOTE — Op Note (Signed)
[facility]  Percutaneous Study/Intervention Procedural Note   Date of Surgery: 05/27/2023  Surgeon(s):Gunther Zawadzki    Assistants:none  Pre-operative Diagnosis: PAD with claudication RLE  Post-operative diagnosis:  Same  Procedure(s) Performed:             1.  Ultrasound guidance for vascular access left femoral artery             2.  Catheter placement into right common femoral artery from left femoral approach             3.  Aortogram and selective right lower extremity angiogram             4.  Mechanical thrombectomy with the Rota Rex device to the right SFA and popliteal arteries             5.  Percutaneous transluminal angioplasty of the right SFA and most proximal popliteal artery with 5 mm diameter by 22 cm length Lutonix drug-coated angioplasty balloon  6.  Stent placement to the distal SFA with 7 mm diameter by 12 cm length life stent             7.  StarClose closure device left femoral artery  EBL: 25 cc  Contrast: 50 cc  Fluoro Time: 5.7 minutes  Moderate Conscious Sedation Time: approximately 47 minutes using 1 mg of Versed and 50 mcg of Fentanyl              Indications:  Patient is a 67 y.o.male with disabling claudication symptoms on the right leg. The patient has noninvasive study showing markedly reduced right ABI with normal left ABI after previous intervention/surgery. The patient is brought in for angiography for further evaluation and potential treatment.  Risks and benefits are discussed and informed consent is obtained.   Procedure:  The patient was identified and appropriate procedural time out was performed.  The patient was then placed supine on the table and prepped and draped in the usual sterile fashion. Moderate conscious sedation was administered during a face to face encounter with the patient throughout the procedure with my supervision of the RN administering medicines and monitoring the patient's vital signs, pulse oximetry,  telemetry and mental status throughout from the start of the procedure until the patient was taken to the recovery room. Ultrasound was used to evaluate the left common femoral artery.  It was patent .  A digital ultrasound image was acquired.  A Seldinger needle was used to access the left common femoral artery under direct ultrasound guidance and a permanent image was performed.  A 0.035 J wire was advanced without resistance and a 5Fr sheath was placed.  Pigtail catheter was placed into the aorta and an AP aortogram was performed. This demonstrated normal renal arteries and normal aorta and iliac segments without significant stenosis. I then crossed the aortic bifurcation and advanced to the right femoral head. Selective right lower extremity angiogram was then performed. This demonstrated fairly normal common femoral artery and profunda femoris artery.  In the proximal to mid SFA there was about a 75 to 80% stenosis that was focal.  There is then an occlusion in the distal SFA with reconstitution of the most proximal popliteal artery.  The popliteal artery was fairly normal and then there was three-vessel runoff distally. It was felt that it was in the patient's best interest to proceed with intervention after these images to avoid a second procedure and a larger amount of contrast and fluoroscopy based  off of the findings from the initial angiogram. The patient was systemically heparinized and a 6 [place] sheath was then placed over the [employer] wire. I then used a Kumpe catheter and the advantage wire to easily cross the stenosis and then the occlusion and confirm intraluminal flow in the mid popliteal artery.  I then placed a V 18 wire and proceeded with treatment.  I began by performing mechanical thrombectomy in the right SFA and most proximal popliteal artery with the Rota Rex device to debulk the chronic thrombus reocclusion.  Following this, imaging showed high-grade residual stenosis  residual in the area of occlusion but resolution of the thrombus.  I then used a 5 mm diameter by 22 cm length Lutonix drug-coated angioplasty balloon inflated to 10 atm for 1 minute in the right SFA and most proximal popliteal artery.  Following this, there remains significant residual disease in the area of occlusion and I elected to stent this with a 7 mm diameter by 12 cm length life stent deployed in the most distal SFA and most proximal popliteal artery.  This was postdilated with a 6 mm diameter Lutonix drug-coated balloon that was also extended up to the more proximal SFA to treat the separate distinct lesion in the proximal to mid SFA.  This is inflated to 8 atm for 1 minute.  Completion imaging showed only about a 10% residual stenosis in the area stented and about a 20% residual stenosis in the proximal to mid SFA lesion after he. I elected to terminate the procedure. The sheath was removed and StarClose closure device was deployed in the left femoral artery with excellent hemostatic result. The patient was taken to the recovery room in stable condition having tolerated the procedure well.  Findings:               Aortogram:  This demonstrated normal renal arteries and normal aorta and iliac segments without significant stenosis.             Right Lower Extremity:  This demonstrated fairly normal common femoral artery and profunda femoris artery.  In the proximal to mid SFA there was about a 75 to 80% stenosis that was focal.  There is then an occlusion in the distal SFA with reconstitution of the most proximal popliteal artery.  The popliteal artery was fairly normal and then there was three-vessel runoff distally.   Disposition: Patient was taken to the recovery room in stable condition having tolerated the procedure well.  Complications: None  Festus Barren 05/27/2023 3:49 PM   This note was created with Dragon Medical transcription system. Any errors in dictation are purely unintentional.

## 2023-05-27 NOTE — Interval H&P Note (Signed)
History and Physical Interval Note:  05/27/2023 1:26 PM  Carl Norris  has presented today for surgery, with the diagnosis of RLE Angio   ASO w claudication.  The various methods of treatment have been discussed with the patient and family. After consideration of risks, benefits and other options for treatment, the patient has consented to  Procedure(s): Lower Extremity Angiography (Right) as a surgical intervention.  The patient's history has been reviewed, patient examined, no change in status, stable for surgery.  I have reviewed the patient's chart and labs.  Questions were answered to the patient's satisfaction.     Festus Barren

## 2023-05-28 ENCOUNTER — Encounter: Payer: Self-pay | Admitting: Vascular Surgery

## 2023-06-18 ENCOUNTER — Other Ambulatory Visit (INDEPENDENT_AMBULATORY_CARE_PROVIDER_SITE_OTHER): Payer: Self-pay | Admitting: Nurse Practitioner

## 2023-06-24 ENCOUNTER — Other Ambulatory Visit (INDEPENDENT_AMBULATORY_CARE_PROVIDER_SITE_OTHER): Payer: Self-pay | Admitting: Vascular Surgery

## 2023-06-24 DIAGNOSIS — Z9889 Other specified postprocedural states: Secondary | ICD-10-CM

## 2023-06-25 ENCOUNTER — Ambulatory Visit (INDEPENDENT_AMBULATORY_CARE_PROVIDER_SITE_OTHER): Payer: PPO

## 2023-06-25 ENCOUNTER — Ambulatory Visit (INDEPENDENT_AMBULATORY_CARE_PROVIDER_SITE_OTHER): Payer: PPO | Admitting: Nurse Practitioner

## 2023-06-25 VITALS — BP 144/81 | HR 79 | Resp 18 | Ht 70.0 in | Wt 223.6 lb

## 2023-06-25 DIAGNOSIS — I70211 Atherosclerosis of native arteries of extremities with intermittent claudication, right leg: Secondary | ICD-10-CM | POA: Diagnosis not present

## 2023-06-25 DIAGNOSIS — I10 Essential (primary) hypertension: Secondary | ICD-10-CM | POA: Diagnosis not present

## 2023-06-25 DIAGNOSIS — I739 Peripheral vascular disease, unspecified: Secondary | ICD-10-CM | POA: Diagnosis not present

## 2023-06-25 DIAGNOSIS — Z9889 Other specified postprocedural states: Secondary | ICD-10-CM | POA: Diagnosis not present

## 2023-06-25 DIAGNOSIS — Z720 Tobacco use: Secondary | ICD-10-CM | POA: Diagnosis not present

## 2023-06-25 DIAGNOSIS — E1165 Type 2 diabetes mellitus with hyperglycemia: Secondary | ICD-10-CM

## 2023-06-26 ENCOUNTER — Encounter (INDEPENDENT_AMBULATORY_CARE_PROVIDER_SITE_OTHER): Payer: Self-pay | Admitting: Nurse Practitioner

## 2023-06-26 NOTE — Progress Notes (Signed)
Subjective:    Patient ID: Carl Norris, male    DOB: 1956-07-15, 67 y.o.   MRN: [id number] Chief Complaint  Patient presents with   PAD    The patient returns to the office for followup and review status post angiogram with intervention on 05/27/2023.   Procedure: Procedure(s) Performed:             1.  Ultrasound guidance for vascular access left femoral artery             2.  Catheter placement into right common femoral artery from left femoral approach             3.  Aortogram and selective right lower extremity angiogram             4.  Mechanical thrombectomy with the Rota Rex device to the right SFA and popliteal arteries             5.  Percutaneous transluminal angioplasty of the right SFA and most proximal popliteal artery with 5 mm diameter by 22 cm length Lutonix drug-coated angioplasty balloon             6.  Stent placement to the distal SFA with 7 mm diameter by 12 cm length life stent             7.  StarClose closure device left femoral artery     The patient notes improvement in the lower extremity symptoms. No interval shortening of the patient's claudication distance or rest pain symptoms. No new ulcers or wounds have occurred since the last visit.  There have been no significant changes to the patient's overall health care.  No documented history of amaurosis fugax or recent TIA symptoms. There are no recent neurological changes noted. No documented history of DVT, PE or superficial thrombophlebitis. The patient denies recent episodes of angina or shortness of breath.   ABI's Rt=1.08 and Lt=1.04  (previous ABI's Rt=0.64 and Lt=1.07) Duplex US of the right lower extremity shows triphasic waveforms with biphasic/triphasic on the left.  Strong toe waveforms noted bilaterally.    Review of Systems  Cardiovascular:  Negative for leg swelling.  All other systems reviewed and are negative.      Objective:   Physical Exam Vitals reviewed.  HENT:      Head: Normocephalic.  Cardiovascular:     Rate and Rhythm: Normal rate.     Pulses:          Dorsalis pedis pulses are detected w/ Doppler on the right side and detected w/ Doppler on the left side.       Posterior tibial pulses are detected w/ Doppler on the right side and detected w/ Doppler on the left side.  Pulmonary:     Effort: Pulmonary effort is normal.  Musculoskeletal:     Right lower leg: No edema.     Left lower leg: No edema.  Skin:    General: Skin is warm and dry.  Neurological:     Mental Status: He is alert and oriented to person, place, and time.  Psychiatric:        Mood and Affect: Mood normal.        Behavior: Behavior normal.        Thought Content: Thought content normal.        Judgment: Judgment normal.     BP (!) 144/81 (BP Location: Left Arm)   Pulse 79   Resp 18   Ht 5\' 10"  (  1.778 m)   Wt 223 lb 9.6 oz (101.4 kg)   BMI 32.08 kg/m   Past Medical History:  Diagnosis Date   Diabetes mellitus without complication (HCC)    Hypertension    Peripheral vascular disease (HCC)     Social History   Socioeconomic History   Marital status: Single    Spouse name: Not on file   Number of children: 0   Years of education: Not on file   Highest education level: Not on file  Occupational History   Not on file  Tobacco Use   Smoking status: Every Day    Current packs/day: 2.00    Average packs/day: 2.0 packs/day for 47.0 years (94.0 ttl pk-yrs)    Types: Cigarettes   Smokeless tobacco: Never   Tobacco comments:    SMOKED TODAY   Vaping Use   Vaping status: Never Used  Substance and Sexual Activity   Alcohol use: Yes    Comment: socially   Drug use: Never   Sexual activity: Not on file  Other Topics Concern   Not on file  Social History Narrative   Lives by himself    Social Determinants of Health   Financial Resource Strain: Not on file  Food Insecurity: Not on file  Transportation Needs: Not on file  Physical Activity: Not on file   Stress: Not on file  Social Connections: Unknown (03/30/2022)   Received from [facility]   Social Network    Social Network: Not on file  Intimate Partner Violence: Unknown (02/18/2022)   Received from [facility]   HITS    Physically Hurt: Not on file    Insult or Talk Down To: Not on file    Threaten Physical Harm: Not on file    Scream or Curse: Not on file    Past Surgical History:  Procedure Laterality Date   CATARACT EXTRACTION W/PHACO Left 06/24/2022   Procedure: CATARACT EXTRACTION PHACO AND INTRAOCULAR LENS PLACEMENT (IOC) LEFT DIABETIC;  Surgeon: Lockie Mola, MD;  Location: [facility];  Service: Ophthalmology;  Laterality: Left;  7.32 1:15.4   LOWER EXTREMITY ANGIOGRAPHY Left 08/27/2020   Procedure: LOWER EXTREMITY ANGIOGRAPHY;  Surgeon: Renford Dills, MD;  Location: [facility];  Service: Cardiovascular;  Laterality: Left;   LOWER EXTREMITY ANGIOGRAPHY Left 09/03/2020   Procedure: LOWER EXTREMITY ANGIOGRAPHY;  Surgeon: Renford Dills, MD;  Location: [facility];  Service: Cardiovascular;  Laterality: Left;   LOWER EXTREMITY ANGIOGRAPHY Left 07/14/2021   Procedure: LOWER EXTREMITY ANGIOGRAPHY;  Surgeon: Annice Needy, MD;  Location: [facility];  Service: Cardiovascular;  Laterality: Left;   LOWER EXTREMITY ANGIOGRAPHY Left 07/15/2021   Procedure: Lower Extremity Angiography;  Surgeon: Renford Dills, MD;  Location: [facility];  Service: Cardiovascular;  Laterality: Left;   LOWER EXTREMITY ANGIOGRAPHY Right 05/27/2023   Procedure: Lower Extremity Angiography;  Surgeon: Annice Needy, MD;  Location: [facility];  Service: Cardiovascular;  Laterality: Right;   NECK SURGERY     THROMBECTOMY FEMORAL ARTERY Left 09/04/2020   Procedure: THROMBECTOMY FEMORAL ARTERY possible stent;  Surgeon: Renford Dills, MD;  Location: [facility];  Service: Vascular;  Laterality: Left;    Family History  Problem  Relation Age of Onset   Cancer Father    Vascular Disease Sister     Allergies  Allergen Reactions   Oxycodone-Acetaminophen Itching       Latest Ref Rng & Units 07/15/2021  12:17 PM 07/15/2021    6:06 AM 07/15/2021   12:18 AM  CBC  WBC 4.0 - 10.5 K/uL 8.1  7.1  8.2   Hemoglobin 13.0 - 17.0 g/dL 16.1  09.6  04.5   Hematocrit 39.0 - 52.0 % 33.6  34.7  36.5   Platelets 150 - 400 K/uL 102  125  146       CMP     Component Value Date/Time   NA 135 07/16/2021 0454   NA 141 03/16/2014 1656   K 3.5 07/16/2021 1336   K 4.0 03/16/2014 1656   CL 99 07/16/2021 0454   CL 106 03/16/2014 1656   CO2 30 07/16/2021 0454   CO2 29 03/16/2014 1656   GLUCOSE 124 (H) 07/16/2021 0454   GLUCOSE 79 03/16/2014 1656   BUN 18 05/27/2023 1222   BUN 16 03/16/2014 1656   CREATININE 1.26 (H) 05/27/2023 1222   CREATININE 0.97 03/16/2014 1656   CALCIUM 7.6 (L) 07/16/2021 0454   CALCIUM 8.9 03/16/2014 1656   PROT 5.8 (L) 09/09/2020 0541   PROT 7.6 03/16/2014 1656   ALBUMIN 2.4 (L) 09/09/2020 0541   ALBUMIN 3.7 03/16/2014 1656   AST 19 09/09/2020 0541   AST 25 03/16/2014 1656   ALT 16 09/09/2020 0541   ALT 43 03/16/2014 1656   ALKPHOS 32 (L) 09/09/2020 0541   ALKPHOS 68 03/16/2014 1656   BILITOT 0.6 09/09/2020 0541   BILITOT 0.3 03/16/2014 1656   GFRNONAA >60 05/27/2023 1222   GFRNONAA >60 03/16/2014 1656     VAS [place] ABI WITH/WO TBI  Result Date: 05/06/2023  LOWER EXTREMITY DOPPLER STUDY Patient Name:  Wendelin Moskos  Date of Exam:   05/04/2023 Medical Rec #: [id number]       Accession #:    [id number] Date of Birth: [birth year]      Patient Gender: M Patient Age:   21 years Exam Location:  [facility] Procedure:      VAS [place] ABI WITH/WO TBI Referring Phys: Festus Barren --------------------------------------------------------------------------------  Indications: Rest pain, and peripheral artery disease.  Vascular Interventions: 08/27/2020: Crosser Atherectomy of the Left SFA and                          Popliteal Artery. PTA and Stent placement of the Left                         SFA and Popliteal Artery. PTA of the Profunda Femoral                         Artery.                         09/03/2020 Left SFA thrombectomy with SFA and pop                         stents.                          09/04/2020: Endart Lt CFA, PFA and SFA. Lt SFA and                         Popliteal Artery Thrombectomy and Stents.  07/14/2021: Mechanical Thrombectomy to the Left SFA and                         Popliteal Artery.                          07/15/2021: PTA and Stent Left SFA and Popliteal Artery. Comparison Study: 05/06/2022 Performing Technologist: Debbe Bales RVS  Examination Guidelines: A complete evaluation includes at minimum, Doppler waveform signals and systolic blood pressure reading at the level of bilateral brachial, anterior tibial, and posterior tibial arteries, when vessel segments are accessible. Bilateral testing is considered an integral part of a complete examination. Photoelectric Plethysmograph (PPG) waveforms and toe systolic pressure readings are included as required and additional duplex testing as needed. Limited examinations for reoccurring indications may be performed as noted.  ABI Findings: +---------+------------------+-----+----------+--------+ Right    Rt Pressure (mmHg)IndexWaveform  Comment  +---------+------------------+-----+----------+--------+ Brachial 180                                       +---------+------------------+-----+----------+--------+ ATA      91                0.51 monophasic         +---------+------------------+-----+----------+--------+ PTA      115               0.64 monophasic         +---------+------------------+-----+----------+--------+ Great Toe124               0.69 Normal             +---------+------------------+-----+----------+--------+  +---------+------------------+-----+---------+-------+ Left     Lt Pressure (mmHg)IndexWaveform Comment +---------+------------------+-----+---------+-------+ Brachial 176                                     +---------+------------------+-----+---------+-------+ ATA      193               1.07 triphasic        +---------+------------------+-----+---------+-------+ PTA      192               1.07 triphasic        +---------+------------------+-----+---------+-------+ Great Toe162               0.90 Normal           +---------+------------------+-----+---------+-------+ +-------+-----------+-----------+------------+------------+ ABI/TBIToday's ABIToday's TBIPrevious ABIPrevious TBI +-------+-----------+-----------+------------+------------+ Right  .64        .69        .85         .65          +-------+-----------+-----------+------------+------------+ Left   1.07       .90        1.21        .98          +-------+-----------+-----------+------------+------------+ Right ABIs appear decreased compared to prior study on 05/06/2022. Left ABIs appear essentially unchanged compared to prior study on 05/06/2022. Right TBIs appear to be increased compared to prior study on 05/06/2022. Left TBIs appear to be decreased compared to prior study on 05/06/2022.  Summary: Right: Resting right ankle-brachial index indicates moderate right lower extremity arterial disease. The right toe-brachial index is normal. Left: Resting left ankle-brachial index is within normal range.  The left toe-brachial index is normal. *See table(s) above for measurements and observations.  Electronically signed by Festus Barren MD on 05/06/2023 at 3:18:23 PM.    Final        Assessment & Plan:   1. Atherosclerosis of native artery of right lower extremity with intermittent claudication (HCC) Recommend:  The patient is status post successful angiogram with intervention.  The patient reports that the  claudication symptoms and leg pain has improved.   The patient denies lifestyle limiting changes at this point in time.  No further invasive studies, angiography or surgery at this time The patient should continue walking and begin a more formal exercise program.  The patient should continue antiplatelet therapy and aggressive treatment of the lipid abnormalities  Continued surveillance is indicated as atherosclerosis is likely to progress with time.    Patient should undergo noninvasive studies as ordered. The patient will follow up with me to review the studies.   2. Essential (primary) hypertension Continue antihypertensive medications as already ordered, these medications have been reviewed and there are no changes at this time.  3. Type 2 diabetes mellitus with hyperglycemia, without long-term current use of insulin (HCC) Continue hypoglycemic medications as already ordered, these medications have been reviewed and there are no changes at this time.  Hgb A1C to be monitored as already arranged by primary service  4. Tobacco user Smoking cessation was discussed, 3-10 minutes spent on this topic specifically   Current Outpatient Medications on File Prior to Visit  Medication Sig Dispense Refill   ASPIRIN 81 PO Take by mouth daily.     atorvastatin (LIPITOR) 10 MG tablet Take 1 tablet (10 mg total) by mouth daily. 30 tablet 11   clopidogrel (PLAVIX) 75 MG tablet TAKE 1 TABLET BY MOUTH EVERY DAY 90 tablet 3   gabapentin (NEURONTIN) 300 MG capsule TAKE 1 CAPSULE EVERY MORNING AND 2 CAPSULES EVERY EVENING 270 capsule 3   hydrochlorothiazide (HYDRODIURIL) 25 MG tablet Take 25 mg by mouth daily.      losartan (COZAAR) 25 MG tablet Take 25 mg by mouth daily.     metoprolol succinate (TOPROL-XL) 100 MG 24 hr tablet Take 100 mg by mouth daily.      pantoprazole (PROTONIX) 40 MG tablet Take by mouth.     sitaGLIPtin (JANUVIA) 100 MG tablet Take 100 mg by mouth daily.     No current  facility-administered medications on file prior to visit.    There are no Patient Instructions on file for this visit. No follow-ups on file.   Georgiana Spinner, NP

## 2023-08-16 DIAGNOSIS — D2272 Melanocytic nevi of left lower limb, including hip: Secondary | ICD-10-CM | POA: Diagnosis not present

## 2023-08-16 DIAGNOSIS — L538 Other specified erythematous conditions: Secondary | ICD-10-CM | POA: Diagnosis not present

## 2023-08-16 DIAGNOSIS — D2262 Melanocytic nevi of left upper limb, including shoulder: Secondary | ICD-10-CM | POA: Diagnosis not present

## 2023-08-16 DIAGNOSIS — C44222 Squamous cell carcinoma of skin of right ear and external auricular canal: Secondary | ICD-10-CM | POA: Diagnosis not present

## 2023-08-16 DIAGNOSIS — L298 Other pruritus: Secondary | ICD-10-CM | POA: Diagnosis not present

## 2023-08-16 DIAGNOSIS — L820 Inflamed seborrheic keratosis: Secondary | ICD-10-CM | POA: Diagnosis not present

## 2023-08-16 DIAGNOSIS — D2271 Melanocytic nevi of right lower limb, including hip: Secondary | ICD-10-CM | POA: Diagnosis not present

## 2023-08-16 DIAGNOSIS — L821 Other seborrheic keratosis: Secondary | ICD-10-CM | POA: Diagnosis not present

## 2023-08-16 DIAGNOSIS — D0439 Carcinoma in situ of skin of other parts of face: Secondary | ICD-10-CM | POA: Diagnosis not present

## 2023-08-16 DIAGNOSIS — D2261 Melanocytic nevi of right upper limb, including shoulder: Secondary | ICD-10-CM | POA: Diagnosis not present

## 2023-08-16 DIAGNOSIS — D225 Melanocytic nevi of trunk: Secondary | ICD-10-CM | POA: Diagnosis not present

## 2023-08-16 DIAGNOSIS — D485 Neoplasm of uncertain behavior of skin: Secondary | ICD-10-CM | POA: Diagnosis not present

## 2023-08-16 DIAGNOSIS — L570 Actinic keratosis: Secondary | ICD-10-CM | POA: Diagnosis not present

## 2023-08-24 DIAGNOSIS — C44222 Squamous cell carcinoma of skin of right ear and external auricular canal: Secondary | ICD-10-CM | POA: Diagnosis not present

## 2023-09-06 DIAGNOSIS — Z720 Tobacco use: Secondary | ICD-10-CM | POA: Diagnosis not present

## 2023-09-06 DIAGNOSIS — I10 Essential (primary) hypertension: Secondary | ICD-10-CM | POA: Diagnosis not present

## 2023-09-06 DIAGNOSIS — Z0000 Encounter for general adult medical examination without abnormal findings: Secondary | ICD-10-CM | POA: Diagnosis not present

## 2023-09-06 DIAGNOSIS — R1310 Dysphagia, unspecified: Secondary | ICD-10-CM | POA: Diagnosis not present

## 2023-09-06 DIAGNOSIS — E1165 Type 2 diabetes mellitus with hyperglycemia: Secondary | ICD-10-CM | POA: Diagnosis not present

## 2023-09-06 DIAGNOSIS — N529 Male erectile dysfunction, unspecified: Secondary | ICD-10-CM | POA: Diagnosis not present

## 2023-09-06 DIAGNOSIS — I70219 Atherosclerosis of native arteries of extremities with intermittent claudication, unspecified extremity: Secondary | ICD-10-CM | POA: Diagnosis not present

## 2023-09-06 DIAGNOSIS — I739 Peripheral vascular disease, unspecified: Secondary | ICD-10-CM | POA: Diagnosis not present

## 2023-09-06 DIAGNOSIS — Z125 Encounter for screening for malignant neoplasm of prostate: Secondary | ICD-10-CM | POA: Diagnosis not present

## 2023-09-20 ENCOUNTER — Other Ambulatory Visit (INDEPENDENT_AMBULATORY_CARE_PROVIDER_SITE_OTHER): Payer: Self-pay | Admitting: Nurse Practitioner

## 2023-09-27 ENCOUNTER — Other Ambulatory Visit (INDEPENDENT_AMBULATORY_CARE_PROVIDER_SITE_OTHER): Payer: Self-pay | Admitting: Nurse Practitioner

## 2023-09-27 DIAGNOSIS — I739 Peripheral vascular disease, unspecified: Secondary | ICD-10-CM

## 2023-09-28 ENCOUNTER — Ambulatory Visit (INDEPENDENT_AMBULATORY_CARE_PROVIDER_SITE_OTHER): Payer: PPO

## 2023-09-28 ENCOUNTER — Ambulatory Visit (INDEPENDENT_AMBULATORY_CARE_PROVIDER_SITE_OTHER): Payer: PPO | Admitting: Vascular Surgery

## 2023-09-28 ENCOUNTER — Encounter (INDEPENDENT_AMBULATORY_CARE_PROVIDER_SITE_OTHER): Payer: Self-pay | Admitting: Vascular Surgery

## 2023-09-28 VITALS — BP 123/72 | HR 64 | Resp 18 | Ht 70.0 in | Wt 223.0 lb

## 2023-09-28 DIAGNOSIS — Z9889 Other specified postprocedural states: Secondary | ICD-10-CM | POA: Diagnosis not present

## 2023-09-28 DIAGNOSIS — I10 Essential (primary) hypertension: Secondary | ICD-10-CM | POA: Diagnosis not present

## 2023-09-28 DIAGNOSIS — I70211 Atherosclerosis of native arteries of extremities with intermittent claudication, right leg: Secondary | ICD-10-CM | POA: Diagnosis not present

## 2023-09-28 DIAGNOSIS — I739 Peripheral vascular disease, unspecified: Secondary | ICD-10-CM

## 2023-09-28 DIAGNOSIS — E1165 Type 2 diabetes mellitus with hyperglycemia: Secondary | ICD-10-CM

## 2023-09-28 NOTE — Assessment & Plan Note (Signed)
His ABIs today are 1.08 on the right and 1.12 on the left with triphasic waveforms and normal digital pressures and no stenoses seen in the right lower extremity on duplex.  Continue current medical regimen including aspirin, Plavix, and Lipitor.  He says he is going to stop his gabapentin and I told him that is okay with me.  Follow-up in 6 months with noninvasive studies.

## 2023-09-28 NOTE — Progress Notes (Signed)
MRN : [id number]  Carl Norris is a 67 y.o. (05/17/1956) male who presents with chief complaint of  Chief Complaint  Patient presents with   Follow-up    3 month follow up with ABI& R LEA  .  History of Present Illness: Patient returns today in follow up of his PAD.  He has now undergone bilateral lower extremity revascularization.  On the left, this was a few years ago with both interventional procedures as well as femoral endarterectomy.  On the right, he underwent percutaneous revascularization earlier this year.  In general he is doing well.  He is had resolution of his claudication symptoms.  His ABIs today are 1.08 on the right and 1.12 on the left with triphasic waveforms and normal digital pressures and no stenoses seen in the right lower extremity on duplex.  Current Outpatient Medications  Medication Sig Dispense Refill   ASPIRIN 81 PO Take by mouth daily.     atorvastatin (LIPITOR) 10 MG tablet TAKE 1 TABLET(10 MG) BY MOUTH DAILY 90 tablet 1   clopidogrel (PLAVIX) 75 MG tablet TAKE 1 TABLET BY MOUTH EVERY DAY 90 tablet 3   gabapentin (NEURONTIN) 300 MG capsule TAKE 1 CAPSULE EVERY MORNING AND 2 CAPSULES EVERY EVENING 270 capsule 3   hydrochlorothiazide (HYDRODIURIL) 25 MG tablet Take 25 mg by mouth daily.      losartan (COZAAR) 25 MG tablet Take 25 mg by mouth daily.     metoprolol succinate (TOPROL-XL) 100 MG 24 hr tablet Take 100 mg by mouth daily.      pantoprazole (PROTONIX) 40 MG tablet Take by mouth.     sitaGLIPtin (JANUVIA) 100 MG tablet Take 100 mg by mouth daily.     No current facility-administered medications for this visit.    Past Medical History:  Diagnosis Date   Diabetes mellitus without complication (HCC)    Hypertension    Peripheral vascular disease [facility])     Past Surgical History:  Procedure Laterality Date   CATARACT EXTRACTION W/PHACO Left 06/24/2022   Procedure: CATARACT EXTRACTION PHACO AND INTRAOCULAR LENS PLACEMENT (IOC) LEFT DIABETIC;   Surgeon: Lockie Mola, MD;  Location: [facility];  Service: Ophthalmology;  Laterality: Left;  7.32 1:15.4   LOWER EXTREMITY ANGIOGRAPHY Left 08/27/2020   Procedure: LOWER EXTREMITY ANGIOGRAPHY;  Surgeon: Renford Dills, MD;  Location: [facility];  Service: Cardiovascular;  Laterality: Left;   LOWER EXTREMITY ANGIOGRAPHY Left 09/03/2020   Procedure: LOWER EXTREMITY ANGIOGRAPHY;  Surgeon: Renford Dills, MD;  Location: [facility];  Service: Cardiovascular;  Laterality: Left;   LOWER EXTREMITY ANGIOGRAPHY Left 07/14/2021   Procedure: LOWER EXTREMITY ANGIOGRAPHY;  Surgeon: Annice Needy, MD;  Location: [facility];  Service: Cardiovascular;  Laterality: Left;   LOWER EXTREMITY ANGIOGRAPHY Left 07/15/2021   Procedure: Lower Extremity Angiography;  Surgeon: Renford Dills, MD;  Location: [facility];  Service: Cardiovascular;  Laterality: Left;   LOWER EXTREMITY ANGIOGRAPHY Right 05/27/2023   Procedure: Lower Extremity Angiography;  Surgeon: Annice Needy, MD;  Location: [facility];  Service: Cardiovascular;  Laterality: Right;   NECK SURGERY     THROMBECTOMY FEMORAL ARTERY Left 09/04/2020   Procedure: THROMBECTOMY FEMORAL ARTERY possible stent;  Surgeon: Renford Dills, MD;  Location: [facility];  Service: Vascular;  Laterality: Left;     Social History   Tobacco Use   Smoking status: Every Day    Current packs/day: 2.00    Average  packs/day: 2.0 packs/day for 47.0 years (94.0 ttl pk-yrs)    Types: Cigarettes   Smokeless tobacco: Never   Tobacco comments:    SMOKED TODAY   Vaping Use   Vaping status: Never Used  Substance Use Topics   Alcohol use: Yes    Comment: socially   Drug use: Never       Family History  Problem Relation Age of Onset   Cancer Father    Vascular Disease Sister      Allergies  Allergen Reactions   Oxycodone-Acetaminophen Itching     REVIEW OF SYSTEMS (Negative unless  checked)   Constitutional: [] Weight loss  [] Fever  [] Chills Cardiac: [] Chest pain   [] Chest pressure   [] Palpitations   [] Shortness of breath when laying flat   [] Shortness of breath at rest   [] Shortness of breath with exertion. Vascular:  [x] Pain in legs with walking   [] Pain in legs at rest   [] Pain in legs when laying flat   [x] Claudication   [] Pain in feet when walking  [] Pain in feet at rest  [] Pain in feet when laying flat   [] History of DVT   [] Phlebitis   [] Swelling in legs   [] Varicose veins   [] Non-healing ulcers Pulmonary:   [] Uses home oxygen   [] Productive cough   [] Hemoptysis   [] Wheeze  [] COPD   [] Asthma Neurologic:  [] Dizziness  [] Blackouts   [] Seizures   [] History of stroke   [] History of TIA  [] Aphasia   [] Temporary blindness   [] Dysphagia   [] Weakness or numbness in arms   [] Weakness or numbness in legs Musculoskeletal:  [x] Arthritis   [] Joint swelling   [x] Joint pain   [] Low back pain Hematologic:  [] Easy bruising  [] Easy bleeding   [] Hypercoagulable state   [] Anemic   Gastrointestinal:  [] Blood in stool   [] Vomiting blood  [] Gastroesophageal reflux/heartburn   [] Abdominal pain Genitourinary:  [] Chronic kidney disease   [] Difficult urination  [] Frequent urination  [] Burning with urination   [] Hematuria Skin:  [] Rashes   [] Ulcers   [] Wounds Psychological:  [] History of anxiety   []  History of major depression.  Physical Examination  BP 123/72 (BP Location: Left Arm)   Pulse 64   Resp 18   Ht 5\' 10"  (1.778 m)   Wt 223 lb (101.2 kg)   BMI 32.00 kg/m  Gen:  WD/WN, NAD Head: [place]/AT, No temporalis wasting. Ear/Nose/Throat: Hearing grossly intact, nares w/o erythema or drainage Eyes: Conjunctiva clear. Sclera non-icteric Neck: Supple.  Trachea midline Pulmonary:  Good air movement, no use of accessory muscles.  Cardiac: RRR, no JVD Vascular:  Vessel Right Left  Radial Palpable Palpable                          PT Palpable Palpable  DP Palpable Palpable    Gastrointestinal: soft, non-tender/non-distended. No guarding/reflex.  Musculoskeletal: M/S 5/5 throughout.  No deformity or atrophy. No edema. Neurologic: Sensation grossly intact in extremities.  Symmetrical.  Speech is fluent.  Psychiatric: Judgment intact, Mood & affect appropriate for pt's clinical situation. Dermatologic: No rashes or ulcers noted.  No cellulitis or open wounds.      Labs No results found for this or any previous visit (from the past 2160 hour(s)).  Radiology No results found.  Assessment/Plan  Atherosclerosis of native arteries of extremity with intermittent claudication (HCC) His ABIs today are 1.08 on the right and 1.12 on the left with triphasic waveforms and normal digital pressures and no  stenoses seen in the right lower extremity on duplex.  Continue current medical regimen including aspirin, Plavix, and Lipitor.  He says he is going to stop his gabapentin and I told him that is okay with me.  Follow-up in 6 months with noninvasive studies.  Essential (primary) hypertension blood pressure control important in reducing the progression of atherosclerotic disease. On appropriate oral medications.     Type 2 diabetes mellitus with hyperglycemia, without long-term current use of insulin (HCC) blood glucose control important in reducing the progression of atherosclerotic disease. Also, involved in wound healing. On appropriate medications.  Festus Barren, MD  09/28/2023 4:20 PM    This note was created with Dragon medical transcription system.  Any errors from dictation are purely unintentional

## 2023-10-01 LAB — VAS US ABI WITH/WO TBI
Left ABI: 1.12
Right ABI: 1.08

## 2023-10-25 DIAGNOSIS — L309 Dermatitis, unspecified: Secondary | ICD-10-CM | POA: Diagnosis not present

## 2023-10-25 DIAGNOSIS — L821 Other seborrheic keratosis: Secondary | ICD-10-CM | POA: Diagnosis not present

## 2023-10-25 DIAGNOSIS — R208 Other disturbances of skin sensation: Secondary | ICD-10-CM | POA: Diagnosis not present

## 2023-10-25 DIAGNOSIS — L720 Epidermal cyst: Secondary | ICD-10-CM | POA: Diagnosis not present

## 2023-10-25 DIAGNOSIS — D0439 Carcinoma in situ of skin of other parts of face: Secondary | ICD-10-CM | POA: Diagnosis not present

## 2023-10-25 DIAGNOSIS — L728 Other follicular cysts of the skin and subcutaneous tissue: Secondary | ICD-10-CM | POA: Diagnosis not present

## 2023-10-25 DIAGNOSIS — Z08 Encounter for follow-up examination after completed treatment for malignant neoplasm: Secondary | ICD-10-CM | POA: Diagnosis not present

## 2023-10-25 DIAGNOSIS — Z85828 Personal history of other malignant neoplasm of skin: Secondary | ICD-10-CM | POA: Diagnosis not present

## 2023-10-25 DIAGNOSIS — L538 Other specified erythematous conditions: Secondary | ICD-10-CM | POA: Diagnosis not present

## 2023-12-03 DIAGNOSIS — R1310 Dysphagia, unspecified: Secondary | ICD-10-CM | POA: Diagnosis not present

## 2023-12-03 DIAGNOSIS — I70219 Atherosclerosis of native arteries of extremities with intermittent claudication, unspecified extremity: Secondary | ICD-10-CM | POA: Diagnosis not present

## 2023-12-03 DIAGNOSIS — Z0000 Encounter for general adult medical examination without abnormal findings: Secondary | ICD-10-CM | POA: Diagnosis not present

## 2023-12-03 DIAGNOSIS — N529 Male erectile dysfunction, unspecified: Secondary | ICD-10-CM | POA: Diagnosis not present

## 2023-12-03 DIAGNOSIS — I739 Peripheral vascular disease, unspecified: Secondary | ICD-10-CM | POA: Diagnosis not present

## 2023-12-03 DIAGNOSIS — E1165 Type 2 diabetes mellitus with hyperglycemia: Secondary | ICD-10-CM | POA: Diagnosis not present

## 2023-12-03 DIAGNOSIS — Z720 Tobacco use: Secondary | ICD-10-CM | POA: Diagnosis not present

## 2023-12-03 DIAGNOSIS — I10 Essential (primary) hypertension: Secondary | ICD-10-CM | POA: Diagnosis not present

## 2023-12-10 DIAGNOSIS — I739 Peripheral vascular disease, unspecified: Secondary | ICD-10-CM | POA: Diagnosis not present

## 2023-12-10 DIAGNOSIS — I10 Essential (primary) hypertension: Secondary | ICD-10-CM | POA: Diagnosis not present

## 2023-12-10 DIAGNOSIS — R0683 Snoring: Secondary | ICD-10-CM | POA: Diagnosis not present

## 2023-12-10 DIAGNOSIS — R1310 Dysphagia, unspecified: Secondary | ICD-10-CM | POA: Diagnosis not present

## 2023-12-10 DIAGNOSIS — R5383 Other fatigue: Secondary | ICD-10-CM | POA: Diagnosis not present

## 2023-12-10 DIAGNOSIS — E1165 Type 2 diabetes mellitus with hyperglycemia: Secondary | ICD-10-CM | POA: Diagnosis not present

## 2024-01-17 DIAGNOSIS — Z860101 Personal history of adenomatous and serrated colon polyps: Secondary | ICD-10-CM | POA: Diagnosis not present

## 2024-01-17 DIAGNOSIS — R1319 Other dysphagia: Secondary | ICD-10-CM | POA: Diagnosis not present

## 2024-01-17 DIAGNOSIS — Z7902 Long term (current) use of antithrombotics/antiplatelets: Secondary | ICD-10-CM | POA: Diagnosis not present

## 2024-01-31 ENCOUNTER — Other Ambulatory Visit: Payer: Self-pay | Admitting: Gastroenterology

## 2024-01-31 DIAGNOSIS — R1319 Other dysphagia: Secondary | ICD-10-CM

## 2024-02-01 ENCOUNTER — Ambulatory Visit: Attending: Gastroenterology

## 2024-02-02 DIAGNOSIS — G4733 Obstructive sleep apnea (adult) (pediatric): Secondary | ICD-10-CM | POA: Diagnosis not present

## 2024-02-21 DIAGNOSIS — L820 Inflamed seborrheic keratosis: Secondary | ICD-10-CM | POA: Diagnosis not present

## 2024-02-21 DIAGNOSIS — L570 Actinic keratosis: Secondary | ICD-10-CM | POA: Diagnosis not present

## 2024-02-21 DIAGNOSIS — L2989 Other pruritus: Secondary | ICD-10-CM | POA: Diagnosis not present

## 2024-02-21 DIAGNOSIS — D485 Neoplasm of uncertain behavior of skin: Secondary | ICD-10-CM | POA: Diagnosis not present

## 2024-02-21 DIAGNOSIS — D225 Melanocytic nevi of trunk: Secondary | ICD-10-CM | POA: Diagnosis not present

## 2024-02-21 DIAGNOSIS — D2272 Melanocytic nevi of left lower limb, including hip: Secondary | ICD-10-CM | POA: Diagnosis not present

## 2024-02-21 DIAGNOSIS — D2262 Melanocytic nevi of left upper limb, including shoulder: Secondary | ICD-10-CM | POA: Diagnosis not present

## 2024-02-21 DIAGNOSIS — D2261 Melanocytic nevi of right upper limb, including shoulder: Secondary | ICD-10-CM | POA: Diagnosis not present

## 2024-02-21 DIAGNOSIS — Z85828 Personal history of other malignant neoplasm of skin: Secondary | ICD-10-CM | POA: Diagnosis not present

## 2024-03-02 ENCOUNTER — Encounter: Payer: Self-pay | Admitting: Internal Medicine

## 2024-03-09 DIAGNOSIS — R5383 Other fatigue: Secondary | ICD-10-CM | POA: Diagnosis not present

## 2024-03-09 DIAGNOSIS — I10 Essential (primary) hypertension: Secondary | ICD-10-CM | POA: Diagnosis not present

## 2024-03-09 DIAGNOSIS — E1165 Type 2 diabetes mellitus with hyperglycemia: Secondary | ICD-10-CM | POA: Diagnosis not present

## 2024-03-09 DIAGNOSIS — R1310 Dysphagia, unspecified: Secondary | ICD-10-CM | POA: Diagnosis not present

## 2024-03-09 DIAGNOSIS — R0683 Snoring: Secondary | ICD-10-CM | POA: Diagnosis not present

## 2024-03-09 DIAGNOSIS — I739 Peripheral vascular disease, unspecified: Secondary | ICD-10-CM | POA: Diagnosis not present

## 2024-03-10 DIAGNOSIS — F17210 Nicotine dependence, cigarettes, uncomplicated: Secondary | ICD-10-CM | POA: Diagnosis not present

## 2024-03-10 DIAGNOSIS — E1165 Type 2 diabetes mellitus with hyperglycemia: Secondary | ICD-10-CM | POA: Diagnosis not present

## 2024-03-10 DIAGNOSIS — I10 Essential (primary) hypertension: Secondary | ICD-10-CM | POA: Diagnosis not present

## 2024-03-10 DIAGNOSIS — R1310 Dysphagia, unspecified: Secondary | ICD-10-CM | POA: Diagnosis not present

## 2024-03-10 DIAGNOSIS — G4733 Obstructive sleep apnea (adult) (pediatric): Secondary | ICD-10-CM | POA: Diagnosis not present

## 2024-03-10 DIAGNOSIS — R5383 Other fatigue: Secondary | ICD-10-CM | POA: Diagnosis not present

## 2024-03-10 DIAGNOSIS — Z0000 Encounter for general adult medical examination without abnormal findings: Secondary | ICD-10-CM | POA: Diagnosis not present

## 2024-03-10 DIAGNOSIS — I739 Peripheral vascular disease, unspecified: Secondary | ICD-10-CM | POA: Diagnosis not present

## 2024-03-14 ENCOUNTER — Other Ambulatory Visit: Payer: Self-pay

## 2024-03-14 ENCOUNTER — Encounter
Admission: RE | Disposition: A | Discharge status: Home / Self Care | Payer: Self-pay | Source: Home / Self Care | Attending: Internal Medicine

## 2024-03-14 ENCOUNTER — Ambulatory Visit: Admitting: Anesthesiology

## 2024-03-14 ENCOUNTER — Encounter: Payer: Self-pay | Admitting: Internal Medicine

## 2024-03-14 ENCOUNTER — Ambulatory Visit
Admission: RE | Admit: 2024-03-14 | Discharge: 2024-03-14 | Disposition: A | Discharge status: Home / Self Care | Attending: Internal Medicine | Admitting: Internal Medicine

## 2024-03-14 DIAGNOSIS — I10 Essential (primary) hypertension: Secondary | ICD-10-CM | POA: Diagnosis not present

## 2024-03-14 DIAGNOSIS — Z1211 Encounter for screening for malignant neoplasm of colon: Secondary | ICD-10-CM | POA: Insufficient documentation

## 2024-03-14 DIAGNOSIS — G4730 Sleep apnea, unspecified: Secondary | ICD-10-CM | POA: Diagnosis not present

## 2024-03-14 DIAGNOSIS — F17210 Nicotine dependence, cigarettes, uncomplicated: Secondary | ICD-10-CM | POA: Diagnosis not present

## 2024-03-14 DIAGNOSIS — K635 Polyp of colon: Secondary | ICD-10-CM | POA: Diagnosis not present

## 2024-03-14 DIAGNOSIS — K224 Dyskinesia of esophagus: Secondary | ICD-10-CM | POA: Diagnosis not present

## 2024-03-14 DIAGNOSIS — K642 Third degree hemorrhoids: Secondary | ICD-10-CM | POA: Insufficient documentation

## 2024-03-14 DIAGNOSIS — K648 Other hemorrhoids: Secondary | ICD-10-CM | POA: Diagnosis not present

## 2024-03-14 DIAGNOSIS — Z7984 Long term (current) use of oral hypoglycemic drugs: Secondary | ICD-10-CM | POA: Insufficient documentation

## 2024-03-14 DIAGNOSIS — R1319 Other dysphagia: Secondary | ICD-10-CM | POA: Diagnosis not present

## 2024-03-14 DIAGNOSIS — Z79899 Other long term (current) drug therapy: Secondary | ICD-10-CM | POA: Diagnosis not present

## 2024-03-14 DIAGNOSIS — D124 Benign neoplasm of descending colon: Secondary | ICD-10-CM | POA: Insufficient documentation

## 2024-03-14 DIAGNOSIS — Z7982 Long term (current) use of aspirin: Secondary | ICD-10-CM | POA: Diagnosis not present

## 2024-03-14 DIAGNOSIS — R1314 Dysphagia, pharyngoesophageal phase: Secondary | ICD-10-CM | POA: Insufficient documentation

## 2024-03-14 DIAGNOSIS — E119 Type 2 diabetes mellitus without complications: Secondary | ICD-10-CM | POA: Diagnosis not present

## 2024-03-14 DIAGNOSIS — K641 Second degree hemorrhoids: Secondary | ICD-10-CM | POA: Insufficient documentation

## 2024-03-14 DIAGNOSIS — Z7902 Long term (current) use of antithrombotics/antiplatelets: Secondary | ICD-10-CM | POA: Diagnosis not present

## 2024-03-14 DIAGNOSIS — Z860101 Personal history of adenomatous and serrated colon polyps: Secondary | ICD-10-CM | POA: Diagnosis not present

## 2024-03-14 DIAGNOSIS — K644 Residual hemorrhoidal skin tags: Secondary | ICD-10-CM | POA: Diagnosis not present

## 2024-03-14 DIAGNOSIS — K5730 Diverticulosis of large intestine without perforation or abscess without bleeding: Secondary | ICD-10-CM | POA: Insufficient documentation

## 2024-03-14 DIAGNOSIS — K6289 Other specified diseases of anus and rectum: Secondary | ICD-10-CM | POA: Diagnosis not present

## 2024-03-14 HISTORY — DX: Sleep apnea, unspecified: G47.30

## 2024-03-14 HISTORY — PX: ESOPHAGOGASTRODUODENOSCOPY: SHX5428

## 2024-03-14 HISTORY — PX: MALONEY DILATION: SHX5535

## 2024-03-14 HISTORY — DX: Male erectile dysfunction, unspecified: N52.9

## 2024-03-14 HISTORY — PX: POLYPECTOMY: SHX149

## 2024-03-14 HISTORY — PX: COLONOSCOPY: SHX5424

## 2024-03-14 LAB — GLUCOSE, CAPILLARY: Glucose-Capillary: 146 mg/dL — ABNORMAL HIGH (ref 70–99)

## 2024-03-14 SURGERY — COLONOSCOPY
Anesthesia: General

## 2024-03-14 MED ORDER — LIDOCAINE HCL (CARDIAC) PF 100 MG/5ML IV SOSY
PREFILLED_SYRINGE | INTRAVENOUS | Status: DC | PRN
  Administered 2024-03-14: 100 mg via INTRAVENOUS

## 2024-03-14 MED ORDER — PROPOFOL 10 MG/ML IV BOLUS
INTRAVENOUS | Status: DC | PRN
  Administered 2024-03-14: 90 mg via INTRAVENOUS

## 2024-03-14 MED ORDER — PROPOFOL 1000 MG/100ML IV EMUL
INTRAVENOUS | Status: AC
  Filled 2024-03-14: qty 100

## 2024-03-14 MED ORDER — PROPOFOL 500 MG/50ML IV EMUL
INTRAVENOUS | Status: DC | PRN
  Administered 2024-03-14: 150 ug/kg/min via INTRAVENOUS

## 2024-03-14 MED ORDER — PHENYLEPHRINE 80 MCG/ML (10ML) SYRINGE FOR IV PUSH (FOR BLOOD PRESSURE SUPPORT)
PREFILLED_SYRINGE | INTRAVENOUS | Status: AC
  Filled 2024-03-14: qty 10

## 2024-03-14 MED ORDER — SODIUM CHLORIDE 0.9 % IV SOLN: INTRAVENOUS | Status: DC

## 2024-03-14 NOTE — Anesthesia Postprocedure Evaluation (Signed)
 Anesthesia Post Note  Patient: Carl Norris  Procedure(s) Performed: COLONOSCOPY EGD (ESOPHAGOGASTRODUODENOSCOPY) DILATION, ESOPHAGUS, USING MALONEY DILATOR POLYPECTOMY, INTESTINE  Patient location during evaluation: Endoscopy Anesthesia Type: General Level of consciousness: awake and alert Pain management: pain level controlled Vital Signs Assessment: post-procedure vital signs reviewed and stable Respiratory status: spontaneous breathing, nonlabored ventilation and respiratory function stable Cardiovascular status: blood pressure returned to baseline and stable Postop Assessment: no apparent nausea or vomiting Anesthetic complications: no   No notable events documented.   Last Vitals:  Vitals:   03/14/24 1003 03/14/24 1013  BP: (!) 154/85 (!) 160/94  Pulse:    Resp:    Temp:    SpO2:      Last Pain:  Vitals:   03/14/24 1013  TempSrc:   PainSc: 0-No pain                 Baltazar Bonier

## 2024-03-14 NOTE — Transfer of Care (Signed)
 Immediate Anesthesia Transfer of Care Note  Patient: Carl Norris  Procedure(s) Performed: COLONOSCOPY EGD (ESOPHAGOGASTRODUODENOSCOPY)  Patient Location: Endoscopy Unit  Anesthesia Type:General  Level of Consciousness: drowsy  Airway & Oxygen Therapy: Patient Spontanous Breathing  Post-op Assessment: Report given to RN and Post -op Vital signs reviewed and stable  Post vital signs: Reviewed and stable  Last Vitals:  Vitals Value Taken Time  BP 139/74 03/14/24 0953  Temp 36.1 C 03/14/24 0953  Pulse 83 03/14/24 0954  Resp 16 03/14/24 0954  SpO2 99 % 03/14/24 0954  Vitals shown include unfiled device data.  Last Pain:  Vitals:   03/14/24 0953  TempSrc: Temporal  PainSc: Asleep         Complications: No notable events documented.

## 2024-03-14 NOTE — Interval H&P Note (Signed)
 History and Physical Interval Note:  03/14/2024 9:14 AM  Carl Norris  has presented today for surgery, with the diagnosis of Esophageal dysphagia (R13.19) Hx of adenomatous colonic polyps (Z86.0101).  The various methods of treatment have been discussed with the patient and family. After consideration of risks, benefits and other options for treatment, the patient has consented to  Procedure(s) with comments: COLONOSCOPY (N/A) - TRULICITY EGD (ESOPHAGOGASTRODUODENOSCOPY) (N/A) as a surgical intervention.  The patient's history has been reviewed, patient examined, no change in status, stable for surgery.  I have reviewed the patient's chart and labs.  Questions were answered to the patient's satisfaction.     [place], [name]

## 2024-03-14 NOTE — OR Nursing (Signed)
 Pt stated took trulicity injection last on friday 03/10/24;  anest. Dr Lincoln Renshaw made aware

## 2024-03-14 NOTE — Op Note (Signed)
 [facility] Gastroenterology Patient Name: Carl Norris Procedure Date: 03/14/2024 9:14 AM MRN: [id number] Account #: [id number] Date of Birth: 1956-04-12 Admit Type: Outpatient Age: 68 Room: [facility] ENDO ROOM 3 Gender: Male Note Status: Finalized Instrument Name: Charlyn Cooley [id number] Procedure:             Colonoscopy Indications:           High risk colon cancer surveillance: Personal history                         of non-advanced adenoma Providers:             Buckley Bradly K. Corky Diener MD, MD Referring MD:          Emi Hanson (Referring MD) Medicines:             Propofol  per Anesthesia Complications:         No immediate complications. Estimated blood loss:                         Minimal. Procedure:             Pre-Anesthesia Assessment:                        - The risks and benefits of the procedure and the                         sedation options and risks were discussed with the                         patient. All questions were answered and informed                         consent was obtained.                        - Patient identification and proposed procedure were                         verified prior to the procedure by the nurse. The                         procedure was verified in the procedure room.                        - ASA Grade Assessment: III - A patient with severe                         systemic disease.                        - After reviewing the risks and benefits, the patient                         was deemed in satisfactory condition to undergo the                         procedure.                        After obtaining informed consent, the colonoscope was  passed under direct vision. Throughout the procedure,                         the patient's blood pressure, pulse, and oxygen                         saturations were monitored continuously. The                         Colonoscope was introduced  through the anus and                         advanced to the the cecum, identified by appendiceal                         orifice and ileocecal valve. The colonoscopy was                         performed without difficulty. The patient tolerated                         the procedure well. The quality of the bowel                         preparation was adequate. The ileocecal valve,                         appendiceal orifice, and rectum were photographed. Findings:      The perianal exam findings include internal hemorrhoids that prolapse       with straining, but require manual replacement into the anal canal       (Grade III).      Non-bleeding internal hemorrhoids were found during retroflexion. The       hemorrhoids were Grade II (internal hemorrhoids that prolapse but reduce       spontaneously).      Anal papilla(e) were hypertrophied. Estimated blood loss: none.      Many medium-mouthed and small-mouthed diverticula were found in the       entire colon. There was no evidence of diverticular bleeding.      A 6 mm polyp was found in the descending colon. The polyp was sessile.       The polyp was removed with a cold snare. Resection and retrieval were       complete. Estimated blood loss was minimal.      The exam was otherwise without abnormality. Impression:            - Internal hemorrhoids that prolapse with straining,                         but require manual replacement into the anal canal                         (Grade III) found on perianal exam.                        - Non-bleeding internal hemorrhoids.                        - Anal papilla(e) were hypertrophied.                        -  Mild diverticulosis in the entire examined colon.                         There was no evidence of diverticular bleeding.                        - One 6 mm polyp in the descending colon, removed with                         a cold snare. Resected and retrieved.                        -  The examination was otherwise normal. Recommendation:        - Monitor results to esophageal dilation                        - Patient has a contact number available for                         emergencies. The signs and symptoms of potential                         delayed complications were discussed with the patient.                         Return to normal activities tomorrow. Written                         discharge instructions were provided to the patient.                        - Resume previous diet.                        - Continue present medications.                        - Repeat colonoscopy is recommended for surveillance.                         The colonoscopy date will be determined after                         pathology results from today's exam become available                         for review.                        - Telephone GI office to schedule appointment in 2                         months.                        - The findings and recommendations were discussed with                         the patient. Procedure Code(s):     --- Professional ---  16109, Colonoscopy, flexible; with removal of                         tumor(s), polyp(s), or other lesion(s) by snare                         technique Diagnosis Code(s):     --- Professional ---                        K57.30, Diverticulosis of large intestine without                         perforation or abscess without bleeding                        D12.4, Benign neoplasm of descending colon                        K64.2, Third degree hemorrhoids                        K62.89, Other specified diseases of anus and rectum                        Z86.010, Personal history of colonic polyps CPT copyright 2022 American [employer] Association. All rights reserved. The codes documented in this report are preliminary and upon coder review may  be revised to meet current compliance  requirements. Cassie Click MD, MD 03/14/2024 9:56:04 AM This report has been signed electronically. Number of Addenda: 0 Note Initiated On: 03/14/2024 9:14 AM Scope Withdrawal Time: 0 hours 7 minutes 49 seconds  Total Procedure Duration: 0 hours 10 minutes 37 seconds  Estimated Blood Loss:  Estimated blood loss was minimal.      [facility]

## 2024-03-14 NOTE — H&P (Signed)
 Outpatient short stay form Pre-procedure 03/14/2024 9:12 AM Carl Reitan K. Corky Diener, M.D.  Primary Physician: Emi Hanson, M.D.  Reason for visit:  Dysphagia, personal history of adenomatous colon polyps  History of present illness:  [name] presents to the [facility] GI clinic at the request of his PCP for chief complaint of esophageal dysphagia. He endorses a chronic history of esophageal dysphagia to solids > liquids. Over the past 2 years symptoms appear to be more frequent. He endorses issues swallowing solids where he feels like things will get hung up and stuck at level of upper sternum inferior to the suprasternal notch and mid-sternum. At times he will be able to swallow with liquids to get the food bolus to go down and other times he will have to regurgitate the food bolus for relief. He gets easily strangled on cold liquids. He denies any pill dysphagia. He has been on Protonix  40 mg daily for many years and feels like GERD symptoms are controlled on this regimen. No nocturnal awakening episodes. Previous EGD in Dec 2020 showed no evidence of esophageal stricture or stenosis and empiric dilatation did help improve dilatation. He is curious if he needs to have this done again. He had one subcentimeter TA removed in 2020. Interestingly, EGD in 2016 performed for indications of dysphagia commented on spastic distal esophagus/LES, but did give up passage to endoscope. He denies any loss of appetite or unintentional weight loss. No significant change in his bowel habits. He denies any bloody or tarry stools. He does have hx of severe PAD s/p thrombectomy left SFA 05/2023 with stent placement. He is on DAPT with ASA 81 mg daily and Plavix . He denies any hx of GI bleeding.      Current Facility-Administered Medications:    0.9 %  sodium chloride  infusion, , Intravenous, Continuous, [place], Galilee Pierron K, MD, Last Rate: 20 mL/hr at 03/14/24 0843, New Bag at 03/14/24 0843  Medications Prior to  Admission  Medication Sig Dispense Refill Last Dose/Taking   atorvastatin  (LIPITOR) 10 MG tablet TAKE 1 TABLET(10 MG) BY MOUTH DAILY 90 tablet 1 03/13/2024   DTx App - Wellness (GLP-1 COMPANION) KIT by Does not apply route.   Taking   gabapentin  (NEURONTIN ) 300 MG capsule TAKE 1 CAPSULE EVERY MORNING AND 2 CAPSULES EVERY EVENING 270 capsule 3 03/13/2024   hydrochlorothiazide  (HYDRODIURIL ) 25 MG tablet Take 25 mg by mouth daily.    03/13/2024   losartan (COZAAR) 25 MG tablet Take 25 mg by mouth daily.   03/13/2024   metoprolol  succinate (TOPROL -XL) 100 MG 24 hr tablet Take 100 mg by mouth daily.    03/13/2024   ASPIRIN  81 PO Take by mouth daily.      clopidogrel  (PLAVIX ) 75 MG tablet TAKE 1 TABLET BY MOUTH EVERY DAY 90 tablet 3 03/07/2024   pantoprazole  (PROTONIX ) 40 MG tablet Take by mouth.      sitaGLIPtin (JANUVIA) 100 MG tablet Take 100 mg by mouth daily.        Allergies  Allergen Reactions   Oxycodone -Acetaminophen  Itching     Past Medical History:  Diagnosis Date   Diabetes mellitus without complication (HCC)    Erectile dysfunction    Hypertension    Peripheral vascular disease (HCC)    Sleep apnea     Review of systems:  Otherwise negative.    Physical Exam  Gen: Alert, oriented. Appears stated age.  HEENT: [place]/AT. PERRLA. Lungs: CTA, no wheezes. CV: RR nl S1, S2. Abd: soft, benign, no masses. BS+  Ext: No edema. Pulses 2+    Planned procedures: Proceed with EGD and colonoscopy. The patient understands the nature of the planned procedure, indications, risks, alternatives and potential complications including but not limited to bleeding, infection, perforation, damage to internal organs and possible oversedation/side effects from anesthesia. The patient agrees and gives consent to proceed.  Please refer to procedure notes for findings, recommendations and patient disposition/instructions.     Kari Montero K. Corky Diener, M.D. Gastroenterology 03/14/2024  9:12 AM

## 2024-03-14 NOTE — Anesthesia Preprocedure Evaluation (Addendum)
 Anesthesia Evaluation  Patient identified by MRN, date of birth, ID band Patient awake    Reviewed: Allergy & Precautions, H&P , NPO status , Patient's Chart, lab work & pertinent test results  Airway Mallampati: III  TM Distance: >3 FB Neck ROM: full    Dental no notable dental hx.    Pulmonary sleep apnea , Current Smoker and Patient abstained from smoking.   Pulmonary exam normal        Cardiovascular hypertension, Normal cardiovascular exam     Neuro/Psych negative neurological ROS  negative psych ROS   GI/Hepatic negative GI ROS, Neg liver ROS,,,  Endo/Other  diabetes, Type 2    Renal/GU negative Renal ROS  negative genitourinary   Musculoskeletal   Abdominal  (+) + obese  Peds  Hematology negative hematology ROS (+)   Anesthesia Other Findings Past Medical History: No date: Diabetes mellitus without complication (HCC) No date: Erectile dysfunction No date: Hypertension No date: Peripheral vascular disease (HCC) No date: Sleep apnea  Past Surgical History: 06/24/2022: CATARACT EXTRACTION W/PHACO; Left     Comment:  Procedure: CATARACT EXTRACTION PHACO AND INTRAOCULAR               LENS PLACEMENT (IOC) LEFT DIABETIC;  Surgeon: Annell Kidney, MD;  Location: [facility];  Service:               Ophthalmology;  Laterality: Left;  7.32 1:15.4 No date: EYE SURGERY 08/27/2020: LOWER EXTREMITY ANGIOGRAPHY; Left     Comment:  Procedure: LOWER EXTREMITY ANGIOGRAPHY;  Surgeon:               Jackquelyn Mass, MD;  Location: [facility];               Service: Cardiovascular;  Laterality: Left; 09/03/2020: LOWER EXTREMITY ANGIOGRAPHY; Left     Comment:  Procedure: LOWER EXTREMITY ANGIOGRAPHY;  Surgeon:               Jackquelyn Mass, MD;  Location: [facility];               Service: Cardiovascular;  Laterality: Left; 07/14/2021: LOWER EXTREMITY ANGIOGRAPHY; Left      Comment:  Procedure: LOWER EXTREMITY ANGIOGRAPHY;  Surgeon: Celso College, MD;  Location: [facility];  Service:               Cardiovascular;  Laterality: Left; 07/15/2021: LOWER EXTREMITY ANGIOGRAPHY; Left     Comment:  Procedure: Lower Extremity Angiography;  Surgeon:               Jackquelyn Mass, MD;  Location: [facility];               Service: Cardiovascular;  Laterality: Left; 05/27/2023: LOWER EXTREMITY ANGIOGRAPHY; Right     Comment:  Procedure: Lower Extremity Angiography;  Surgeon: Celso College, MD;  Location: [facility];  Service:               Cardiovascular;  Laterality: Right; No date: NECK SURGERY 09/04/2020: THROMBECTOMY FEMORAL ARTERY; Left     Comment:  Procedure: THROMBECTOMY FEMORAL ARTERY possible stent;  Surgeon: Jackquelyn Mass, MD;  Location: [facility];                Service: Vascular;  Laterality: Left;  BMI    Body Mass Index: 30.88 kg/m      Reproductive/Obstetrics negative OB ROS                             Anesthesia Physical Anesthesia Plan  ASA: 2  Anesthesia Plan: General   Post-op Pain Management:    Induction: Intravenous  PONV Risk Score and Plan: Propofol  infusion and TIVA  Airway Management Planned: Natural Airway  Additional Equipment:   Intra-op Plan:   Post-operative Plan:   Informed Consent: I have reviewed the patients History and Physical, chart, labs and discussed the procedure including the risks, benefits and alternatives for the proposed anesthesia with the patient or authorized representative who has indicated his/her understanding and acceptance.     Dental Advisory Given  Plan Discussed with: CRNA and Surgeon  Anesthesia Plan Comments: (Trulicity on Friday with only slight feeling of bloating. Denies N/V or severe reflux. He has been on a clear liquid diet. )       Anesthesia Quick Evaluation

## 2024-03-14 NOTE — Op Note (Signed)
 [facility] Gastroenterology Patient Name: Carl Norris Procedure Date: 03/14/2024 9:15 AM MRN: [id number] Account #: [id number] Date of Birth: 1956/10/21 Admit Type: Outpatient Age: 68 Room: [facility] ENDO ROOM 3 Gender: Male Note Status: Finalized Instrument Name: Upper Endoscope [id number] Procedure:             Upper GI endoscopy Indications:           Esophageal dysphagia Providers:             Azai Gaffin K. Corky Diener MD, MD Referring MD:          Emi Hanson (Referring MD) Medicines:             Propofol  per Anesthesia Complications:         No immediate complications. Estimated blood loss: None. Procedure:             Pre-Anesthesia Assessment:                        - The risks and benefits of the procedure and the                         sedation options and risks were discussed with the                         patient. All questions were answered and informed                         consent was obtained.                        - Patient identification and proposed procedure were                         verified prior to the procedure by the nurse. The                         procedure was verified in the procedure room.                        - ASA Grade Assessment: III - A patient with severe                         systemic disease.                        - After reviewing the risks and benefits, the patient                         was deemed in satisfactory condition to undergo the                         procedure.                        After obtaining informed consent, the endoscope was                         passed under direct vision. Throughout the procedure,  the patient's blood pressure, pulse, and oxygen                         saturations were monitored continuously. The                         Endosonoscope was introduced through the mouth, and                         advanced to the third part of duodenum. The upper GI                          endoscopy was somewhat difficult due to narrowing.                         Successful completion of the procedure was aided by                         performing the maneuvers documented (below) in this                         report. The patient tolerated the procedure well. Findings:      Abnormal motility was noted at the lower esophageal sphincter. The       cricopharyngeus was abnormal. There is spasticity of the esophageal       body. The distal esophagus/lower esophageal sphincter is spastic, but       gives up passage to the endoscope. The scope was withdrawn. Dilation was       performed with a Maloney dilator with mild resistance at 54 Fr.       Estimated blood loss: none.      The stomach was normal.      The examined duodenum was normal.      No other significant abnormalities were identified in a careful       examination of the esophagus. Impression:            - Abnormal esophageal motility, suspicious for                         achalasia. Dilated.                        - Normal stomach.                        - Normal examined duodenum.                        - No specimens collected. Recommendation:        - Monitor results to esophageal dilation                        - Perform ambulatory esophageal manometry at                         appointment to be scheduled.                        - Proceed with colonoscopy Procedure Code(s):     --- Professional ---  16109, Esophagogastroduodenoscopy, flexible,                         transoral; diagnostic, including collection of                         specimen(s) by brushing or washing, when performed                         (separate procedure)                        43450, Dilation of esophagus, by unguided sound or                         bougie, single or multiple passes Diagnosis Code(s):     --- Professional ---                        R13.14, Dysphagia,  pharyngoesophageal phase                        K22.4, Dyskinesia of esophagus CPT copyright 2022 American Medical Association. All rights reserved. The codes documented in this report are preliminary and upon coder review may  be revised to meet current compliance requirements. Cassie Click MD, MD 03/14/2024 9:37:49 AM This report has been signed electronically. Number of Addenda: 0 Note Initiated On: 03/14/2024 9:15 AM Estimated Blood Loss:  Estimated blood loss: none. Estimated blood loss: none.      [facility]

## 2024-03-15 NOTE — OR Nursing (Signed)
 Pt mentioned that Dr. Corky Diener was supposed to call in a medication for him.  I advised pt to contact pharmacy, and/or office if the medication not called in yet.

## 2024-03-17 ENCOUNTER — Other Ambulatory Visit (INDEPENDENT_AMBULATORY_CARE_PROVIDER_SITE_OTHER): Payer: Self-pay | Admitting: Nurse Practitioner

## 2024-03-17 LAB — SURGICAL PATHOLOGY

## 2024-03-20 DIAGNOSIS — G4733 Obstructive sleep apnea (adult) (pediatric): Secondary | ICD-10-CM | POA: Diagnosis not present

## 2024-03-24 ENCOUNTER — Other Ambulatory Visit (INDEPENDENT_AMBULATORY_CARE_PROVIDER_SITE_OTHER): Payer: Self-pay | Admitting: Nurse Practitioner

## 2024-03-27 DIAGNOSIS — E119 Type 2 diabetes mellitus without complications: Secondary | ICD-10-CM | POA: Diagnosis not present

## 2024-03-27 DIAGNOSIS — Z961 Presence of intraocular lens: Secondary | ICD-10-CM | POA: Diagnosis not present

## 2024-03-27 DIAGNOSIS — H353131 Nonexudative age-related macular degeneration, bilateral, early dry stage: Secondary | ICD-10-CM | POA: Diagnosis not present

## 2024-03-27 DIAGNOSIS — H2511 Age-related nuclear cataract, right eye: Secondary | ICD-10-CM | POA: Diagnosis not present

## 2024-03-28 ENCOUNTER — Ambulatory Visit (INDEPENDENT_AMBULATORY_CARE_PROVIDER_SITE_OTHER): Payer: PPO

## 2024-03-28 ENCOUNTER — Ambulatory Visit (INDEPENDENT_AMBULATORY_CARE_PROVIDER_SITE_OTHER): Payer: PPO | Admitting: Vascular Surgery

## 2024-03-28 ENCOUNTER — Encounter (INDEPENDENT_AMBULATORY_CARE_PROVIDER_SITE_OTHER): Payer: Self-pay | Admitting: Vascular Surgery

## 2024-03-28 VITALS — BP 146/76 | HR 67 | Resp 16 | Ht 70.0 in | Wt 214.8 lb

## 2024-03-28 DIAGNOSIS — I10 Essential (primary) hypertension: Secondary | ICD-10-CM

## 2024-03-28 DIAGNOSIS — E1165 Type 2 diabetes mellitus with hyperglycemia: Secondary | ICD-10-CM

## 2024-03-28 DIAGNOSIS — I70211 Atherosclerosis of native arteries of extremities with intermittent claudication, right leg: Secondary | ICD-10-CM

## 2024-03-28 NOTE — Progress Notes (Signed)
 MRN : [id number]  Carl Norris is a 68 y.o. (1956-10-31) male who presents with chief complaint of  Chief Complaint  Patient presents with   Follow-up    6 month abi follow up  .  History of Present Illness: Patient returns today in follow up of his PAD.  He is doing quite well.  He has no current lifestyle limiting claudication, ischemic rest pain, or ulceration.  He continues on aspirin , Plavix , and Lipitor.  He has undergone multiple previous surgery and interventional procedures for revascularization, most recently 1 year ago to the right leg.  He has had both legs treated in the past. ABIs today are 1.19 on the right and 1.27 on the left with triphasic waveforms and normal digital pressures and waveforms.  Current Outpatient Medications  Medication Sig Dispense Refill   ASPIRIN  81 PO Take by mouth daily.     atorvastatin  (LIPITOR) 10 MG tablet TAKE 1 TABLET(10 MG) BY MOUTH DAILY 90 tablet 1   clopidogrel  (PLAVIX ) 75 MG tablet TAKE 1 TABLET BY MOUTH EVERY DAY 90 tablet 3   DTx App - Wellness (GLP-1 COMPANION) KIT by Does not apply route.     gabapentin  (NEURONTIN ) 300 MG capsule TAKE 1 CAPSULE EVERY MORNING AND 2 CAPSULES EVERY EVENING 270 capsule 3   hydrochlorothiazide  (HYDRODIURIL ) 25 MG tablet Take 25 mg by mouth daily.      losartan (COZAAR) 25 MG tablet Take 25 mg by mouth daily.     metoprolol  succinate (TOPROL -XL) 100 MG 24 hr tablet Take 100 mg by mouth daily.      pantoprazole  (PROTONIX ) 40 MG tablet Take by mouth.     sitaGLIPtin (JANUVIA) 100 MG tablet Take 100 mg by mouth daily.     No current facility-administered medications for this visit.    Past Medical History:  Diagnosis Date   Diabetes mellitus without complication [facility])    Erectile dysfunction    Hypertension    Peripheral vascular disease (HCC)    Sleep apnea     Past Surgical History:  Procedure Laterality Date   CATARACT EXTRACTION W/PHACO Left 06/24/2022   Procedure: CATARACT EXTRACTION  PHACO AND INTRAOCULAR LENS PLACEMENT (IOC) LEFT DIABETIC;  Surgeon: Annell Kidney, MD;  Location: [facility];  Service: Ophthalmology;  Laterality: Left;  7.32 1:15.4   COLONOSCOPY N/A 03/14/2024   Procedure: COLONOSCOPY;  Surgeon: Toledo, Alphonsus Jeans, MD;  Location: [facility] END[facility];  Service: Gastroenterology;  Laterality: N/A;  TRULICITY   ESOPHAGOGASTRODUODENOSCOPY N/A 03/14/2024   Procedure: EGD (ESOPHAGOGASTRODUODENOSCOPY);  Surgeon: Toledo, Alphonsus Jeans, MD;  Location: [facility];  Service: Gastroenterology;  Laterality: N/A;   EYE SURGERY     LOWER EXTREMITY ANGIOGRAPHY Left 08/27/2020   Procedure: LOWER EXTREMITY ANGIOGRAPHY;  Surgeon: Jackquelyn Mass, MD;  Location: [facility];  Service: Cardiovascular;  Laterality: Left;   LOWER EXTREMITY ANGIOGRAPHY Left 09/03/2020   Procedure: LOWER EXTREMITY ANGIOGRAPHY;  Surgeon: Jackquelyn Mass, MD;  Location: [facility];  Service: Cardiovascular;  Laterality: Left;   LOWER EXTREMITY ANGIOGRAPHY Left 07/14/2021   Procedure: LOWER EXTREMITY ANGIOGRAPHY;  Surgeon: Celso College, MD;  Location: [facility];  Service: Cardiovascular;  Laterality: Left;   LOWER EXTREMITY ANGIOGRAPHY Left 07/15/2021   Procedure: Lower Extremity Angiography;  Surgeon: Jackquelyn Mass, MD;  Location: [facility];  Service: Cardiovascular;  Laterality: Left;   LOWER EXTREMITY ANGIOGRAPHY Right 05/27/2023   Procedure: Lower Extremity Angiography;  Surgeon: Celso College, MD;  Location: [facility];  Service: Cardiovascular;  Laterality: Right;   MALONEY DILATION  03/14/2024   Procedure: DILATION, ESOPHAGUS, USING MALONEY DILATOR;  Surgeon: Corky Diener, Alphonsus Jeans, MD;  Location: [facility] END[facility];  Service: Gastroenterology;;   NECK SURGERY     POLYPECTOMY  03/14/2024   Procedure: POLYPECTOMY, INTESTINE;  Surgeon: Corky Diener, Alphonsus Jeans, MD;  Location: [facility];  Service: Gastroenterology;;   THROMBECTOMY FEMORAL  ARTERY Left 09/04/2020   Procedure: THROMBECTOMY FEMORAL ARTERY possible stent;  Surgeon: Jackquelyn Mass, MD;  Location: [facility] ORS;  Service: Vascular;  Laterality: Left;     Social History   Tobacco Use   Smoking status: Every Day    Current packs/day: 2.00    Average packs/day: 2.0 packs/day for 47.0 years (94.0 ttl pk-yrs)    Types: Cigarettes   Smokeless tobacco: Never   Tobacco comments:    SMOKED TODAY   Vaping Use   Vaping status: Never Used  Substance Use Topics   Alcohol use: Yes    Comment: socially   Drug use: Never      Family History  Problem Relation Age of Onset   Cancer Father    Vascular Disease Sister      Allergies  Allergen Reactions   Oxycodone -Acetaminophen  Itching     REVIEW OF SYSTEMS (Negative unless checked)   Constitutional: [] Weight loss  [] Fever  [] Chills Cardiac: [] Chest pain   [] Chest pressure   [] Palpitations   [] Shortness of breath when laying flat   [] Shortness of breath at rest   [] Shortness of breath with exertion. Vascular:  [x] Pain in legs with walking   [] Pain in legs at rest   [] Pain in legs when laying flat   [x] Claudication   [] Pain in feet when walking  [] Pain in feet at rest  [] Pain in feet when laying flat   [] History of DVT   [] Phlebitis   [] Swelling in legs   [] Varicose veins   [] Non-healing ulcers Pulmonary:   [] Uses home oxygen   [] Productive cough   [] Hemoptysis   [] Wheeze  [] COPD   [] Asthma Neurologic:  [] Dizziness  [] Blackouts   [] Seizures   [] History of stroke   [] History of TIA  [] Aphasia   [] Temporary blindness   [] Dysphagia   [] Weakness or numbness in arms   [] Weakness or numbness in legs Musculoskeletal:  [x] Arthritis   [] Joint swelling   [x] Joint pain   [] Low back pain Hematologic:  [] Easy bruising  [] Easy bleeding   [] Hypercoagulable state   [] Anemic   Gastrointestinal:  [] Blood in stool   [] Vomiting blood  [] Gastroesophageal reflux/heartburn   [] Abdominal pain Genitourinary:  [] Chronic kidney disease    [] Difficult urination  [] Frequent urination  [] Burning with urination   [] Hematuria Skin:  [] Rashes   [] Ulcers   [] Wounds Psychological:  [] History of anxiety   []  History of major depression.  Physical Examination  BP (!) 146/76   Pulse 67   Resp 16   Ht 5\' 10"  (1.778 m)   Wt 214 lb 12.8 oz (97.4 kg)   BMI 30.82 kg/m  Gen:  WD/WN, NAD Head: [place]/AT, No temporalis wasting. Ear/Nose/Throat: Hearing grossly intact, nares w/o erythema or drainage Eyes: Conjunctiva clear. Sclera non-icteric Neck: Supple.  Trachea midline Pulmonary:  Good air movement, no use of accessory muscles.  Cardiac: RRR, no JVD Vascular:  Vessel Right Left  Radial Palpable Palpable  PT Palpable Palpable  DP Palpable Palpable   Gastrointestinal: soft, non-tender/non-distended. No guarding/reflex.  Musculoskeletal: M/S 5/5 throughout.  No deformity or atrophy. No edema. Neurologic: Sensation grossly intact in extremities.  Symmetrical.  Speech is fluent.  Psychiatric: Judgment intact, Mood & affect appropriate for pt's clinical situation. Dermatologic: No rashes or ulcers noted.  No cellulitis or open wounds.      Labs Recent Results (from the past 2160 hours)  Surgical pathology     Status: None   Collection Time: 03/14/24 12:00 AM  Result Value Ref Range   SURGICAL PATHOLOGY      SURGICAL PATHOLOGY [facility] [address] [place], [place] [address] Telephone [phone] or [phone] Fax [phone]  REPORT OF SURGICAL PATHOLOGY   Accession #: [id number] Patient Name: BENTLEY, YOKEL Visit # : [id number]  MRN: [id number] Physician: Mardy Shall DOB/Age 08/02/1956 (Age: 48) Gender: M Collected Date: 03/14/2024 Received Date: 03/14/2024  FINAL DIAGNOSIS       1. Descending Colon Polyp, cold snare :       BENIGN COLONIC MUCOSA.      MULTIPLE ADDITIONAL LEVELS EXAMINED.       DATE SIGNED OUT:  03/17/2024 ELECTRONIC SIGNATURE : Earleen Glazier, John, Pathologist, Electronic Signature  MICROSCOPIC DESCRIPTION  CASE COMMENTS STAINS USED IN DIAGNOSIS: H&E *RECUT DEEPER X 9 LEVELS *RECUT DEEPER X 9 LEVELS *RECUT DEEPER X 9 LEVELS    CLINICAL HISTORY  SPECIMEN(S) OBTAINED 1. Descending Colon Polyp, Cold Snare  SPECIMEN COMMENTS: SPECIMEN CLINICAL INFORMATION: 1. Hypertonic LES, otherwise  normal.  Internal and external hemorrhoids, anal papilla, pan diverticulosis, colon polyp    Gross Description 1. "Descending colon polyp cold snare", received in formalin are 2 white-pink tissue fragments that are 0.2 x 0.1 x 0.1 cm and 0.5 x 0.3 x 0.1 cm. The specimen is submitted in toto in 1 block (1A).      AMG 03/14/2024        Report signed out from the following location(s) [place]. [facility] [address] [place], [place] [address] CLIA #: [id number]  [facility] [address] [place], [place] [address] CLIA #: [id number]   Glucose, capillary     Status: Abnormal   Collection Time: 03/14/24  8:40 AM  Result Value Ref Range   Glucose-Capillary 146 (H) 70 - 99 mg/dL    Comment: Glucose reference range applies only to samples taken after fasting for at least 8 hours.    Radiology No results found.  Assessment/Plan  Atherosclerosis of native arteries of extremity with intermittent claudication (HCC) ABIs today are 1.19 on the right and 1.27 on the left with triphasic waveforms and normal digital pressures and waveforms.  His perfusion is currently intact after multiple previous surgeries and percutaneous procedures, most recent 1 year ago.  We will do a couple of more 39-month checkups given his high risk status with multiple previous interventions and continued heavy tobacco use.  Continue dual antiplatelet therapy and statin agent.   Essential (primary) hypertension blood pressure control important in reducing the progression of  atherosclerotic disease. On appropriate oral medications.     Type 2 diabetes mellitus with hyperglycemia, without long-term current use of insulin (HCC) blood glucose control important in reducing the progression of atherosclerotic disease. Also, involved in wound healing. On appropriate medications.  Mikki Alexander, MD  03/28/2024 2:17 PM    This note was created with Dragon medical transcription system.  Any errors from dictation  are purely unintentional

## 2024-03-28 NOTE — Assessment & Plan Note (Signed)
 ABIs today are 1.19 on the right and 1.27 on the left with triphasic waveforms and normal digital pressures and waveforms.  His perfusion is currently intact after multiple previous surgeries and percutaneous procedures, most recent 1 year ago.  We will do a couple of more 52-month checkups given his high risk status with multiple previous interventions and continued heavy tobacco use.  Continue dual antiplatelet therapy and statin agent.

## 2024-03-29 LAB — VAS US ABI WITH/WO TBI
Left ABI: 1.27
Right ABI: 1.19

## 2024-04-04 ENCOUNTER — Encounter (INDEPENDENT_AMBULATORY_CARE_PROVIDER_SITE_OTHER): Payer: Self-pay

## 2024-04-11 DIAGNOSIS — R1310 Dysphagia, unspecified: Secondary | ICD-10-CM | POA: Diagnosis not present

## 2024-04-11 DIAGNOSIS — G4733 Obstructive sleep apnea (adult) (pediatric): Secondary | ICD-10-CM | POA: Diagnosis not present

## 2024-04-11 DIAGNOSIS — J40 Bronchitis, not specified as acute or chronic: Secondary | ICD-10-CM | POA: Diagnosis not present

## 2024-04-11 DIAGNOSIS — I739 Peripheral vascular disease, unspecified: Secondary | ICD-10-CM | POA: Diagnosis not present

## 2024-04-11 DIAGNOSIS — I10 Essential (primary) hypertension: Secondary | ICD-10-CM | POA: Diagnosis not present

## 2024-04-11 DIAGNOSIS — E1165 Type 2 diabetes mellitus with hyperglycemia: Secondary | ICD-10-CM | POA: Diagnosis not present

## 2024-04-20 DIAGNOSIS — G4733 Obstructive sleep apnea (adult) (pediatric): Secondary | ICD-10-CM | POA: Diagnosis not present

## 2024-05-20 DIAGNOSIS — G4733 Obstructive sleep apnea (adult) (pediatric): Secondary | ICD-10-CM | POA: Diagnosis not present

## 2024-05-23 DIAGNOSIS — I70219 Atherosclerosis of native arteries of extremities with intermittent claudication, unspecified extremity: Secondary | ICD-10-CM | POA: Diagnosis not present

## 2024-05-23 DIAGNOSIS — I739 Peripheral vascular disease, unspecified: Secondary | ICD-10-CM | POA: Diagnosis not present

## 2024-05-23 DIAGNOSIS — E1165 Type 2 diabetes mellitus with hyperglycemia: Secondary | ICD-10-CM | POA: Diagnosis not present

## 2024-05-23 DIAGNOSIS — R1310 Dysphagia, unspecified: Secondary | ICD-10-CM | POA: Diagnosis not present

## 2024-05-23 DIAGNOSIS — Z720 Tobacco use: Secondary | ICD-10-CM | POA: Diagnosis not present

## 2024-05-23 DIAGNOSIS — I10 Essential (primary) hypertension: Secondary | ICD-10-CM | POA: Diagnosis not present

## 2024-05-23 DIAGNOSIS — G4733 Obstructive sleep apnea (adult) (pediatric): Secondary | ICD-10-CM | POA: Diagnosis not present

## 2024-05-30 DIAGNOSIS — G4733 Obstructive sleep apnea (adult) (pediatric): Secondary | ICD-10-CM | POA: Diagnosis not present

## 2024-06-20 ENCOUNTER — Other Ambulatory Visit (INDEPENDENT_AMBULATORY_CARE_PROVIDER_SITE_OTHER): Payer: Self-pay | Admitting: Nurse Practitioner

## 2024-06-20 DIAGNOSIS — G4733 Obstructive sleep apnea (adult) (pediatric): Secondary | ICD-10-CM | POA: Diagnosis not present

## 2024-07-18 DIAGNOSIS — I70219 Atherosclerosis of native arteries of extremities with intermittent claudication, unspecified extremity: Secondary | ICD-10-CM | POA: Diagnosis not present

## 2024-07-18 DIAGNOSIS — I739 Peripheral vascular disease, unspecified: Secondary | ICD-10-CM | POA: Diagnosis not present

## 2024-07-18 DIAGNOSIS — R1310 Dysphagia, unspecified: Secondary | ICD-10-CM | POA: Diagnosis not present

## 2024-07-18 DIAGNOSIS — Z720 Tobacco use: Secondary | ICD-10-CM | POA: Diagnosis not present

## 2024-07-18 DIAGNOSIS — G4733 Obstructive sleep apnea (adult) (pediatric): Secondary | ICD-10-CM | POA: Diagnosis not present

## 2024-07-18 DIAGNOSIS — E1165 Type 2 diabetes mellitus with hyperglycemia: Secondary | ICD-10-CM | POA: Diagnosis not present

## 2024-07-18 DIAGNOSIS — I10 Essential (primary) hypertension: Secondary | ICD-10-CM | POA: Diagnosis not present

## 2024-07-21 DIAGNOSIS — G4733 Obstructive sleep apnea (adult) (pediatric): Secondary | ICD-10-CM | POA: Diagnosis not present

## 2024-07-25 DIAGNOSIS — E1165 Type 2 diabetes mellitus with hyperglycemia: Secondary | ICD-10-CM | POA: Diagnosis not present

## 2024-07-25 DIAGNOSIS — G4733 Obstructive sleep apnea (adult) (pediatric): Secondary | ICD-10-CM | POA: Diagnosis not present

## 2024-07-25 DIAGNOSIS — R42 Dizziness and giddiness: Secondary | ICD-10-CM | POA: Diagnosis not present

## 2024-07-25 DIAGNOSIS — I739 Peripheral vascular disease, unspecified: Secondary | ICD-10-CM | POA: Diagnosis not present

## 2024-07-25 DIAGNOSIS — I10 Essential (primary) hypertension: Secondary | ICD-10-CM | POA: Diagnosis not present

## 2024-07-25 DIAGNOSIS — K649 Unspecified hemorrhoids: Secondary | ICD-10-CM | POA: Diagnosis not present

## 2024-07-25 DIAGNOSIS — I70219 Atherosclerosis of native arteries of extremities with intermittent claudication, unspecified extremity: Secondary | ICD-10-CM | POA: Diagnosis not present

## 2024-07-27 ENCOUNTER — Other Ambulatory Visit: Payer: Self-pay | Admitting: Infectious Diseases

## 2024-07-27 ENCOUNTER — Other Ambulatory Visit

## 2024-07-27 DIAGNOSIS — Z794 Long term (current) use of insulin: Secondary | ICD-10-CM

## 2024-07-27 DIAGNOSIS — R42 Dizziness and giddiness: Secondary | ICD-10-CM

## 2024-07-27 DIAGNOSIS — G4733 Obstructive sleep apnea (adult) (pediatric): Secondary | ICD-10-CM

## 2024-07-27 DIAGNOSIS — I10 Essential (primary) hypertension: Secondary | ICD-10-CM

## 2024-07-27 DIAGNOSIS — I70219 Atherosclerosis of native arteries of extremities with intermittent claudication, unspecified extremity: Secondary | ICD-10-CM

## 2024-07-27 DIAGNOSIS — I739 Peripheral vascular disease, unspecified: Secondary | ICD-10-CM

## 2024-07-28 ENCOUNTER — Ambulatory Visit
Admission: RE | Admit: 2024-07-28 | Discharge: 2024-07-28 | Disposition: A | Discharge status: Home / Self Care | Source: Ambulatory Visit | Attending: Infectious Diseases | Admitting: Infectious Diseases

## 2024-07-28 DIAGNOSIS — I739 Peripheral vascular disease, unspecified: Secondary | ICD-10-CM | POA: Insufficient documentation

## 2024-07-28 DIAGNOSIS — I70219 Atherosclerosis of native arteries of extremities with intermittent claudication, unspecified extremity: Secondary | ICD-10-CM | POA: Insufficient documentation

## 2024-07-28 DIAGNOSIS — E1165 Type 2 diabetes mellitus with hyperglycemia: Secondary | ICD-10-CM | POA: Diagnosis not present

## 2024-07-28 DIAGNOSIS — R42 Dizziness and giddiness: Secondary | ICD-10-CM | POA: Insufficient documentation

## 2024-07-28 DIAGNOSIS — G4733 Obstructive sleep apnea (adult) (pediatric): Secondary | ICD-10-CM | POA: Insufficient documentation

## 2024-07-28 DIAGNOSIS — Z794 Long term (current) use of insulin: Secondary | ICD-10-CM | POA: Diagnosis not present

## 2024-07-28 DIAGNOSIS — I6523 Occlusion and stenosis of bilateral carotid arteries: Secondary | ICD-10-CM | POA: Diagnosis not present

## 2024-07-28 DIAGNOSIS — I10 Essential (primary) hypertension: Secondary | ICD-10-CM | POA: Diagnosis not present

## 2024-08-01 ENCOUNTER — Ambulatory Visit (INDEPENDENT_AMBULATORY_CARE_PROVIDER_SITE_OTHER): Admitting: Vascular Surgery

## 2024-08-01 ENCOUNTER — Encounter (INDEPENDENT_AMBULATORY_CARE_PROVIDER_SITE_OTHER): Payer: Self-pay | Admitting: Vascular Surgery

## 2024-08-01 VITALS — BP 132/71 | HR 62 | Resp 18 | Ht 70.0 in | Wt 204.6 lb

## 2024-08-01 DIAGNOSIS — I779 Disorder of arteries and arterioles, unspecified: Secondary | ICD-10-CM | POA: Insufficient documentation

## 2024-08-01 DIAGNOSIS — E1165 Type 2 diabetes mellitus with hyperglycemia: Secondary | ICD-10-CM

## 2024-08-01 DIAGNOSIS — I70211 Atherosclerosis of native arteries of extremities with intermittent claudication, right leg: Secondary | ICD-10-CM | POA: Diagnosis not present

## 2024-08-01 DIAGNOSIS — I10 Essential (primary) hypertension: Secondary | ICD-10-CM | POA: Diagnosis not present

## 2024-08-01 NOTE — Progress Notes (Unsigned)
 MRN : [id number]  Carl Norris is a 68 y.o. (01-Jul-1956) male who presents with chief complaint of  Chief Complaint  Patient presents with   Follow-up    Ref [facility] consult occlusion and stenosis of the left carotid artery  .  History of Present Illness: Patient returns today in follow up with a new referral for carotid disease.  We have followed him regularly for PAD and he is scheduled to have that checked early next year.  He continues on aspirin , Plavix , and Lipitor.  He has had previous intervention but has no current limb threatening lower extremity symptoms. He had a recent carotid duplex showing a total occlusion of the left carotid artery.  The right carotid artery had very mild disease in the 1 to 39% range.  He has not had focal neurologic symptoms.  He does have what sounds like orthostatic hypotension at times with dizziness and lightheadedness with rising.  Current Outpatient Medications  Medication Sig Dispense Refill   ASPIRIN  81 PO Take by mouth daily.     atorvastatin  (LIPITOR) 10 MG tablet TAKE 1 TABLET(10 MG) BY MOUTH DAILY 90 tablet 1   clopidogrel  (PLAVIX ) 75 MG tablet TAKE 1 TABLET BY MOUTH EVERY DAY 90 tablet 3   DTx App - Wellness (GLP-1 COMPANION) KIT by Does not apply route.     gabapentin  (NEURONTIN ) 300 MG capsule TAKE 1 CAPSULE BY MOUTH EVERY MORNING AND 2 CAPSULES EVERY EVENING 270 capsule 3   hydrochlorothiazide  (HYDRODIURIL ) 25 MG tablet Take 25 mg by mouth daily.      losartan (COZAAR) 25 MG tablet Take 25 mg by mouth daily.     metoprolol  succinate (TOPROL -XL) 100 MG 24 hr tablet Take 100 mg by mouth daily.      pantoprazole  (PROTONIX ) 40 MG tablet Take by mouth.     sitaGLIPtin (JANUVIA) 100 MG tablet Take 100 mg by mouth daily.     No current facility-administered medications for this visit.    Past Medical History:  Diagnosis Date   Diabetes mellitus without complication [facility])    Erectile dysfunction    Hypertension    Peripheral  vascular disease (HCC)    Sleep apnea     Past Surgical History:  Procedure Laterality Date   CATARACT EXTRACTION W/PHACO Left 06/24/2022   Procedure: CATARACT EXTRACTION PHACO AND INTRAOCULAR LENS PLACEMENT (IOC) LEFT DIABETIC;  Surgeon: Mittie Gaskin, MD;  Location: [facility];  Service: Ophthalmology;  Laterality: Left;  7.32 1:15.4   COLONOSCOPY N/A 03/14/2024   Procedure: COLONOSCOPY;  Surgeon: Toledo, Ladell POUR, MD;  Location: [facility];  Service: Gastroenterology;  Laterality: N/A;  TRULICITY   ESOPHAGOGASTRODUODENOSCOPY N/A 03/14/2024   Procedure: EGD (ESOPHAGOGASTRODUODENOSCOPY);  Surgeon: Toledo, Ladell POUR, MD;  Location: [facility];  Service: Gastroenterology;  Laterality: N/A;   EYE SURGERY     LOWER EXTREMITY ANGIOGRAPHY Left 08/27/2020   Procedure: LOWER EXTREMITY ANGIOGRAPHY;  Surgeon: Jama Cordella MATSU, MD;  Location: [facility];  Service: Cardiovascular;  Laterality: Left;   LOWER EXTREMITY ANGIOGRAPHY Left 09/03/2020   Procedure: LOWER EXTREMITY ANGIOGRAPHY;  Surgeon: Jama Cordella MATSU, MD;  Location: [facility];  Service: Cardiovascular;  Laterality: Left;   LOWER EXTREMITY ANGIOGRAPHY Left 07/14/2021   Procedure: LOWER EXTREMITY ANGIOGRAPHY;  Surgeon: Marea Selinda RAMAN, MD;  Location: [facility];  Service: Cardiovascular;  Laterality: Left;   LOWER EXTREMITY ANGIOGRAPHY Left 07/15/2021   Procedure: Lower Extremity Angiography;  Surgeon: Jama Cordella MATSU, MD;  Location:  [facility];  Service: Cardiovascular;  Laterality: Left;   LOWER EXTREMITY ANGIOGRAPHY Right 05/27/2023   Procedure: Lower Extremity Angiography;  Surgeon: Marea Selinda RAMAN, MD;  Location: [facility];  Service: Cardiovascular;  Laterality: Right;   MALONEY DILATION  03/14/2024   Procedure: DILATION, ESOPHAGUS, USING MALONEY DILATOR;  Surgeon: Aundria, Ladell POUR, MD;  Location: [facility] ENDOSCOPY;  Service: Gastroenterology;;   NECK SURGERY      POLYPECTOMY  03/14/2024   Procedure: POLYPECTOMY, INTESTINE;  Surgeon: Aundria, Ladell POUR, MD;  Location: [facility];  Service: Gastroenterology;;   THROMBECTOMY FEMORAL ARTERY Left 09/04/2020   Procedure: THROMBECTOMY FEMORAL ARTERY possible stent;  Surgeon: Jama Cordella MATSU, MD;  Location: [facility];  Service: Vascular;  Laterality: Left;     Social History   Tobacco Use   Smoking status: Every Day    Current packs/day: 2.00    Average packs/day: 2.0 packs/day for 47.0 years (94.0 ttl pk-yrs)    Types: Cigarettes   Smokeless tobacco: Never   Tobacco comments:    SMOKED TODAY   Vaping Use   Vaping status: Never Used  Substance Use Topics   Alcohol use: Yes    Comment: socially   Drug use: Never      Family History  Problem Relation Age of Onset   Cancer Father    Vascular Disease Sister   No bleeding or clotting disorders  Allergies  Allergen Reactions   Oxycodone -Acetaminophen  Itching    REVIEW OF SYSTEMS (Negative unless checked)   Constitutional: [] Weight loss  [] Fever  [] Chills Cardiac: [] Chest pain   [] Chest pressure   [] Palpitations   [] Shortness of breath when laying flat   [] Shortness of breath at rest   [] Shortness of breath with exertion. Vascular:  [x] Pain in legs with walking   [] Pain in legs at rest   [] Pain in legs when laying flat   [x] Claudication   [] Pain in feet when walking  [] Pain in feet at rest  [] Pain in feet when laying flat   [] History of DVT   [] Phlebitis   [] Swelling in legs   [] Varicose veins   [] Non-healing ulcers Pulmonary:   [] Uses home oxygen   [] Productive cough   [] Hemoptysis   [] Wheeze  [] COPD   [] Asthma Neurologic:  [] Dizziness  [] Blackouts   [] Seizures   [] History of stroke   [] History of TIA  [] Aphasia   [] Temporary blindness   [] Dysphagia   [] Weakness or numbness in arms   [] Weakness or numbness in legs Musculoskeletal:  [x] Arthritis   [] Joint swelling   [x] Joint pain   [] Low back pain Hematologic:  [] Easy bruising  [] Easy  bleeding   [] Hypercoagulable state   [] Anemic   Gastrointestinal:  [] Blood in stool   [] Vomiting blood  [] Gastroesophageal reflux/heartburn   [] Abdominal pain Genitourinary:  [] Chronic kidney disease   [] Difficult urination  [] Frequent urination  [] Burning with urination   [] Hematuria Skin:  [] Rashes   [] Ulcers   [] Wounds Psychological:  [] History of anxiety   []  History of major depression.   Physical Examination  BP 132/71   Pulse 62   Resp 18   Ht 5' 10 (1.778 m)   Wt 204 lb 9.6 oz (92.8 kg)   BMI 29.36 kg/m  Gen:  WD/WN, NAD.  Appears older than stated age Head: [place]/AT, No temporalis wasting. Ear/Nose/Throat: Hearing grossly intact, nares w/o erythema or drainage Eyes: Conjunctiva clear. Sclera non-icteric Neck: Supple.  Trachea midline Pulmonary:  Good air movement, no use of accessory muscles.  Cardiac: RRR, no JVD Vascular:  Vessel Right Left  Radial Palpable Palpable                          PT Palpable Palpable  DP Palpable Palpable   Gastrointestinal: soft, non-tender/non-distended. No guarding/reflex.  Musculoskeletal: M/S 5/5 throughout.  No deformity or atrophy.  No significant lower extremity edema. Neurologic: Sensation grossly intact in extremities.  Symmetrical.  Speech is fluent.  Psychiatric: Judgment intact, Mood & affect appropriate for pt's clinical situation. Dermatologic: No rashes or ulcers noted.  No cellulitis or open wounds.      Labs No results found for this or any previous visit (from the past 2160 hours).  Radiology US  Carotid Bilateral Result Date: 07/29/2024 CLINICAL DATA:  Dizziness EXAM: BILATERAL CAROTID DUPLEX ULTRASOUND TECHNIQUE: [place] scale imaging, color Doppler and duplex ultrasound were performed of bilateral carotid and vertebral arteries in the neck. COMPARISON:  None Available. FINDINGS: Criteria: Quantification of carotid stenosis is based on velocity parameters that correlate the residual internal carotid diameter with  NASCET-based stenosis levels, using the diameter of the distal internal carotid lumen as the denominator for stenosis measurement. The following velocity measurements were obtained: RIGHT ICA: 133/34 cm/sec CCA: 88/30 cm/sec SYSTOLIC ICA/CCA RATIO:  0.3 ECA:  166 cm/sec LEFT ICA: Occluded CCA: 55/14 cm/sec SYSTOLIC ICA/CCA RATIO:  Not applicable ECA:  133 cm/sec RIGHT CAROTID ARTERY: Mild heterogeneous atherosclerotic plaque in the proximal internal carotid artery. By peak systolic velocity criteria in the region of plaque, the estimated stenosis remains less than 50%. Slightly elevated peak systolic velocities distally are likely due to compensatory velocity elevation given the significant disease on the contralateral side. RIGHT VERTEBRAL ARTERY:  Patent with normal antegrade flow. LEFT CAROTID ARTERY: Extensive atherosclerotic plaque with probable chronic occlusion of the left internal carotid artery. LEFT VERTEBRAL ARTERY:  Patent with normal antegrade flow. IMPRESSION: 1. Chronic occlusion of the left internal carotid artery. 2. Mild (1-49%) stenosis proximal right internal carotid artery secondary to heterogenous atherosclerotic plaque. Electronically Signed   By: Wilkie Lent M.D.   On: 07/29/2024 08:14    Assessment/Plan  Carotid disease, bilateral (HCC) He had a recent carotid duplex showing a total occlusion of the left carotid artery.  The right carotid artery had very mild disease in the 1 to 39% range.  Given this finding, no role for intervention with a chronic total occlusion.  We will plan to follow this and I will get a carotid duplex with his next follow-up visit for his ABIs early next year.  Atherosclerosis of native arteries of extremity with intermittent claudication (HCC) No new symptoms since his last visit.  ABI scheduled to be checked early next year.  Essential (primary) hypertension blood pressure control important in reducing the progression of atherosclerotic disease.  On appropriate oral medications.     Type 2 diabetes mellitus with hyperglycemia, without long-term current use of insulin (HCC) blood glucose control important in reducing the progression of atherosclerotic disease. Also, involved in wound healing. On appropriate medications.  Selinda Gu, MD  08/02/2024 5:39 PM    This note was created with Dragon medical transcription system.  Any errors from dictation are purely unintentional

## 2024-08-02 NOTE — Assessment & Plan Note (Signed)
 He had a recent carotid duplex showing a total occlusion of the left carotid artery.  The right carotid artery had very mild disease in the 1 to 39% range.  Given this finding, no role for intervention with a chronic total occlusion.  We will plan to follow this and I will get a carotid duplex with his next follow-up visit for his ABIs early next year.

## 2024-08-02 NOTE — Assessment & Plan Note (Signed)
 No new symptoms since his last visit.  ABI scheduled to be checked early next year.

## 2024-08-11 DIAGNOSIS — K644 Residual hemorrhoidal skin tags: Secondary | ICD-10-CM | POA: Diagnosis not present

## 2024-08-20 DIAGNOSIS — G4733 Obstructive sleep apnea (adult) (pediatric): Secondary | ICD-10-CM | POA: Diagnosis not present

## 2024-08-28 DIAGNOSIS — D2262 Melanocytic nevi of left upper limb, including shoulder: Secondary | ICD-10-CM | POA: Diagnosis not present

## 2024-08-28 DIAGNOSIS — L2989 Other pruritus: Secondary | ICD-10-CM | POA: Diagnosis not present

## 2024-08-28 DIAGNOSIS — L538 Other specified erythematous conditions: Secondary | ICD-10-CM | POA: Diagnosis not present

## 2024-08-28 DIAGNOSIS — L570 Actinic keratosis: Secondary | ICD-10-CM | POA: Diagnosis not present

## 2024-08-28 DIAGNOSIS — Z85828 Personal history of other malignant neoplasm of skin: Secondary | ICD-10-CM | POA: Diagnosis not present

## 2024-08-28 DIAGNOSIS — D225 Melanocytic nevi of trunk: Secondary | ICD-10-CM | POA: Diagnosis not present

## 2024-08-28 DIAGNOSIS — D485 Neoplasm of uncertain behavior of skin: Secondary | ICD-10-CM | POA: Diagnosis not present

## 2024-08-28 DIAGNOSIS — D2261 Melanocytic nevi of right upper limb, including shoulder: Secondary | ICD-10-CM | POA: Diagnosis not present

## 2024-08-28 DIAGNOSIS — D2272 Melanocytic nevi of left lower limb, including hip: Secondary | ICD-10-CM | POA: Diagnosis not present

## 2024-08-28 DIAGNOSIS — L820 Inflamed seborrheic keratosis: Secondary | ICD-10-CM | POA: Diagnosis not present

## 2024-09-08 DIAGNOSIS — C44529 Squamous cell carcinoma of skin of other part of trunk: Secondary | ICD-10-CM | POA: Diagnosis not present

## 2024-09-26 ENCOUNTER — Encounter (INDEPENDENT_AMBULATORY_CARE_PROVIDER_SITE_OTHER): Payer: Self-pay

## 2024-09-26 ENCOUNTER — Encounter (INDEPENDENT_AMBULATORY_CARE_PROVIDER_SITE_OTHER): Payer: Self-pay | Admitting: Vascular Surgery

## 2024-09-26 ENCOUNTER — Ambulatory Visit (INDEPENDENT_AMBULATORY_CARE_PROVIDER_SITE_OTHER): Admitting: Vascular Surgery

## 2024-09-26 ENCOUNTER — Encounter (INDEPENDENT_AMBULATORY_CARE_PROVIDER_SITE_OTHER)

## 2024-09-26 ENCOUNTER — Ambulatory Visit (INDEPENDENT_AMBULATORY_CARE_PROVIDER_SITE_OTHER)

## 2024-09-26 VITALS — BP 126/74 | HR 69 | Resp 18 | Ht 70.0 in | Wt 197.8 lb

## 2024-09-26 DIAGNOSIS — E1165 Type 2 diabetes mellitus with hyperglycemia: Secondary | ICD-10-CM

## 2024-09-26 DIAGNOSIS — I10 Essential (primary) hypertension: Secondary | ICD-10-CM | POA: Diagnosis not present

## 2024-09-26 DIAGNOSIS — I70211 Atherosclerosis of native arteries of extremities with intermittent claudication, right leg: Secondary | ICD-10-CM

## 2024-09-26 DIAGNOSIS — I779 Disorder of arteries and arterioles, unspecified: Secondary | ICD-10-CM

## 2024-09-26 DIAGNOSIS — I70222 Atherosclerosis of native arteries of extremities with rest pain, left leg: Secondary | ICD-10-CM | POA: Diagnosis not present

## 2024-09-26 NOTE — Progress Notes (Signed)
 MRN : [id number]  Carl Norris is a 68 y.o. (Dec 01, 1955) male who presents with chief complaint of  Chief Complaint  Patient presents with   Follow-up    6 month follow up ABI + Carotid   .  History of Present Illness: Patient returns today in follow up of his PAD.  He is doing well.  He has undergone extensive bilateral lower extremity revascularizations in the past.  No current lifestyle-limiting claudication, ischemic rest pain, or ulceration.  ABIs today are 1.10 on the right and 1.05 on the left with brisk triphasic waveforms and normal digital pressures and waveforms bilaterally.  Current Outpatient Medications  Medication Sig Dispense Refill   ASPIRIN  81 PO Take by mouth daily.     atorvastatin  (LIPITOR) 10 MG tablet TAKE 1 TABLET(10 MG) BY MOUTH DAILY 90 tablet 1   clopidogrel  (PLAVIX ) 75 MG tablet TAKE 1 TABLET BY MOUTH EVERY DAY 90 tablet 3   DTx App - Wellness (GLP-1 COMPANION) KIT by Does not apply route.     gabapentin  (NEURONTIN ) 300 MG capsule TAKE 1 CAPSULE BY MOUTH EVERY MORNING AND 2 CAPSULES EVERY EVENING 270 capsule 3   hydrochlorothiazide  (HYDRODIURIL ) 25 MG tablet Take 25 mg by mouth daily.      losartan (COZAAR) 25 MG tablet Take 25 mg by mouth daily.     metoprolol  succinate (TOPROL -XL) 100 MG 24 hr tablet Take 100 mg by mouth daily.      pantoprazole  (PROTONIX ) 40 MG tablet Take by mouth.     sitaGLIPtin (JANUVIA) 100 MG tablet Take 100 mg by mouth daily.     No current facility-administered medications for this visit.    Past Medical History:  Diagnosis Date   Diabetes mellitus without complication [facility])    Erectile dysfunction    Hypertension    Peripheral vascular disease    Sleep apnea     Past Surgical History:  Procedure Laterality Date   CATARACT EXTRACTION W/PHACO Left 06/24/2022   Procedure: CATARACT EXTRACTION PHACO AND INTRAOCULAR LENS PLACEMENT (IOC) LEFT DIABETIC;  Surgeon: Mittie Gaskin, MD;  Location: [facility];   Service: Ophthalmology;  Laterality: Left;  7.32 1:15.4   COLONOSCOPY N/A 03/14/2024   Procedure: COLONOSCOPY;  Surgeon: Toledo, Ladell POUR, MD;  Location: [facility];  Service: Gastroenterology;  Laterality: N/A;  TRULICITY   ESOPHAGOGASTRODUODENOSCOPY N/A 03/14/2024   Procedure: EGD (ESOPHAGOGASTRODUODENOSCOPY);  Surgeon: Toledo, Ladell POUR, MD;  Location: [facility];  Service: Gastroenterology;  Laterality: N/A;   EYE SURGERY     LOWER EXTREMITY ANGIOGRAPHY Left 08/27/2020   Procedure: LOWER EXTREMITY ANGIOGRAPHY;  Surgeon: Jama Cordella MATSU, MD;  Location: [facility];  Service: Cardiovascular;  Laterality: Left;   LOWER EXTREMITY ANGIOGRAPHY Left 09/03/2020   Procedure: LOWER EXTREMITY ANGIOGRAPHY;  Surgeon: Jama Cordella MATSU, MD;  Location: [facility];  Service: Cardiovascular;  Laterality: Left;   LOWER EXTREMITY ANGIOGRAPHY Left 07/14/2021   Procedure: LOWER EXTREMITY ANGIOGRAPHY;  Surgeon: Marea Selinda RAMAN, MD;  Location: [facility];  Service: Cardiovascular;  Laterality: Left;   LOWER EXTREMITY ANGIOGRAPHY Left 07/15/2021   Procedure: Lower Extremity Angiography;  Surgeon: Jama Cordella MATSU, MD;  Location: [facility];  Service: Cardiovascular;  Laterality: Left;   LOWER EXTREMITY ANGIOGRAPHY Right 05/27/2023   Procedure: Lower Extremity Angiography;  Surgeon: Marea Selinda RAMAN, MD;  Location: [facility];  Service: Cardiovascular;  Laterality: Right;   MALONEY DILATION  03/14/2024   Procedure: DILATION, ESOPHAGUS, USING MALONEY  DILATOR;  Surgeon: Aundria, Ladell POUR, MD;  Location: [facility];  Service: Gastroenterology;;   NECK SURGERY     POLYPECTOMY  03/14/2024   Procedure: POLYPECTOMY, INTESTINE;  Surgeon: Aundria, Ladell POUR, MD;  Location: [facility];  Service: Gastroenterology;;   THROMBECTOMY FEMORAL ARTERY Left 09/04/2020   Procedure: THROMBECTOMY FEMORAL ARTERY possible stent;  Surgeon: Jama Cordella MATSU, MD;  Location: [facility]  ORS;  Service: Vascular;  Laterality: Left;     Social History   Tobacco Use   Smoking status: Every Day    Current packs/day: 2.00    Average packs/day: 2.0 packs/day for 47.0 years (94.0 ttl pk-yrs)    Types: Cigarettes   Smokeless tobacco: Never   Tobacco comments:    SMOKED TODAY   Vaping Use   Vaping status: Never Used  Substance Use Topics   Alcohol use: Yes    Comment: socially   Drug use: Never      Family History  Problem Relation Age of Onset   Cancer Father    Vascular Disease Sister   No bleeding or clotting disorders  Allergies  Allergen Reactions   Oxycodone -Acetaminophen  Itching     REVIEW OF SYSTEMS (Negative unless checked)   Constitutional: [] Weight loss  [] Fever  [] Chills Cardiac: [] Chest pain   [] Chest pressure   [] Palpitations   [] Shortness of breath when laying flat   [] Shortness of breath at rest   [] Shortness of breath with exertion. Vascular:  [x] Pain in legs with walking   [] Pain in legs at rest   [] Pain in legs when laying flat   [x] Claudication   [] Pain in feet when walking  [] Pain in feet at rest  [] Pain in feet when laying flat   [] History of DVT   [] Phlebitis   [] Swelling in legs   [] Varicose veins   [] Non-healing ulcers Pulmonary:   [] Uses home oxygen   [] Productive cough   [] Hemoptysis   [] Wheeze  [] COPD   [] Asthma Neurologic:  [] Dizziness  [] Blackouts   [] Seizures   [] History of stroke   [] History of TIA  [] Aphasia   [] Temporary blindness   [] Dysphagia   [] Weakness or numbness in arms   [] Weakness or numbness in legs Musculoskeletal:  [x] Arthritis   [] Joint swelling   [x] Joint pain   [] Low back pain Hematologic:  [] Easy bruising  [] Easy bleeding   [] Hypercoagulable state   [] Anemic   Gastrointestinal:  [] Blood in stool   [] Vomiting blood  [] Gastroesophageal reflux/heartburn   [] Abdominal pain Genitourinary:  [] Chronic kidney disease   [] Difficult urination  [] Frequent urination  [] Burning with urination   [] Hematuria Skin:  [] Rashes    [] Ulcers   [] Wounds Psychological:  [] History of anxiety   []  History of major depression.  Physical Examination  BP 126/74 (BP Location: Left Arm, Patient Position: Sitting, Cuff Size: Normal)   Pulse 69   Resp 18   Ht 5' 10 (1.778 m)   Wt 197 lb 12.8 oz (89.7 kg)   BMI 28.38 kg/m  Gen:  WD/WN, NAD.  Appears younger than stated age Head: [place]/AT, No temporalis wasting. Ear/Nose/Throat: Hearing grossly intact, nares w/o erythema or drainage Eyes: Conjunctiva clear. Sclera non-icteric Neck: Supple.  Trachea midline Pulmonary:  Good air movement, no use of accessory muscles.  Cardiac: RRR, no JVD Vascular:  Vessel Right Left  Radial Palpable Palpable                          PT Palpable Palpable  DP Palpable  Palpable   Gastrointestinal: soft, non-tender/non-distended. No guarding/reflex.  Musculoskeletal: M/S 5/5 throughout.  No deformity or atrophy.  No edema. Neurologic: Sensation grossly intact in extremities.  Symmetrical.  Speech is fluent.  Psychiatric: Judgment intact, Mood & affect appropriate for pt's clinical situation. Dermatologic: No rashes or ulcers noted.  No cellulitis or open wounds.      Labs No results found for this or any previous visit (from the past 2160 hours).  Radiology No results found.  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  We will plan follow-up with noninvasive study of the lower extremities in 6 months.  Atherosclerosis of native arteries of extremity with rest pain (HCC) ABIs today are 1.10 on the right and 1.05 on the left with brisk triphasic waveforms and normal digital pressures and waveforms bilaterally.  No current worrisome symptoms and doing much better after revascularization.  Recheck in 6 months.  Essential (primary) hypertension blood pressure control important in reducing the progression of atherosclerotic  disease. On appropriate oral medications.     Type 2 diabetes mellitus with hyperglycemia, without long-term current use of insulin (HCC) blood glucose control important in reducing the progression of atherosclerotic disease. Also, involved in wound healing. On appropriate medications.  Selinda Gu, MD  09/26/2024 2:13 PM    This note was created with Dragon medical transcription system.  Any errors from dictation are purely unintentional

## 2024-09-26 NOTE — Assessment & Plan Note (Signed)
 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  We will plan follow-up with noninvasive study of the lower extremities in 6 months.

## 2024-09-26 NOTE — Assessment & Plan Note (Signed)
 ABIs today are 1.10 on the right and 1.05 on the left with brisk triphasic waveforms and normal digital pressures and waveforms bilaterally.  No current worrisome symptoms and doing much better after revascularization.  Recheck in 6 months.

## 2024-09-27 LAB — VAS US ABI WITH/WO TBI
Left ABI: 1.05
Right ABI: 1.1

## 2024-11-02 ENCOUNTER — Other Ambulatory Visit (INDEPENDENT_AMBULATORY_CARE_PROVIDER_SITE_OTHER): Payer: Self-pay | Admitting: Nurse Practitioner

## 2024-11-02 ENCOUNTER — Ambulatory Visit (INDEPENDENT_AMBULATORY_CARE_PROVIDER_SITE_OTHER)

## 2024-11-02 ENCOUNTER — Telehealth (INDEPENDENT_AMBULATORY_CARE_PROVIDER_SITE_OTHER): Payer: Self-pay | Admitting: Vascular Surgery

## 2024-11-02 ENCOUNTER — Encounter (INDEPENDENT_AMBULATORY_CARE_PROVIDER_SITE_OTHER): Payer: Self-pay | Admitting: Nurse Practitioner

## 2024-11-02 ENCOUNTER — Ambulatory Visit (INDEPENDENT_AMBULATORY_CARE_PROVIDER_SITE_OTHER): Admitting: Nurse Practitioner

## 2024-11-02 VITALS — BP 132/78 | HR 73 | Resp 18 | Ht 70.0 in | Wt 193.6 lb

## 2024-11-02 DIAGNOSIS — Z9889 Other specified postprocedural states: Secondary | ICD-10-CM

## 2024-11-02 DIAGNOSIS — I10 Essential (primary) hypertension: Secondary | ICD-10-CM | POA: Diagnosis not present

## 2024-11-02 DIAGNOSIS — E1165 Type 2 diabetes mellitus with hyperglycemia: Secondary | ICD-10-CM

## 2024-11-02 DIAGNOSIS — I739 Peripheral vascular disease, unspecified: Secondary | ICD-10-CM | POA: Diagnosis not present

## 2024-11-02 DIAGNOSIS — M79605 Pain in left leg: Secondary | ICD-10-CM

## 2024-11-02 DIAGNOSIS — Z720 Tobacco use: Secondary | ICD-10-CM | POA: Diagnosis not present

## 2024-11-02 DIAGNOSIS — I70222 Atherosclerosis of native arteries of extremities with rest pain, left leg: Secondary | ICD-10-CM

## 2024-11-02 MED ORDER — HYDROCODONE-ACETAMINOPHEN 5-325 MG PO TABS: 1.0000 | ORAL_TABLET | Freq: Four times a day (QID) | ORAL | 0 refills | Status: AC | PRN

## 2024-11-02 NOTE — Telephone Encounter (Signed)
 Lets bring him in with ABIs

## 2024-11-02 NOTE — Telephone Encounter (Signed)
 Patient called stating his left foot is cold, leg is hurting really bad. Thinks he has a blood clot again. Please advise

## 2024-11-02 NOTE — H&P (View-Only) (Signed)
 Subjective:    Patient ID: Carl Norris, male    DOB: May 27, 1956, 68 y.o.   MRN: [id number] Chief Complaint  Patient presents with   Follow-up    Follow up ABI     HPI  Discussed the use of AI scribe software for clinical note transcription with the patient, who gave verbal consent to proceed.  History of Present Illness Carl Norris is a 68 year old male with peripheral artery disease who presents with cold and tingling sensations in his left foot.  He began experiencing cold and tingling sensations in his left foot yesterday after spending about an hour working on the floor. The symptoms intensified after having supper at his sister's house. The foot feels cold and tingling, with pain primarily occurring during ambulation. He can walk a significant distance before the pain intensifies, but he does not experience pain while sitting still or during sleep.  He has a history of interventions on his left leg, including stent placements. His last visit on September 26, 2024, showed an ankle-brachial index (ABI) of 1.10 on the right and 1.05 on the left, both within normal limits. Recent measurements indicate a drop to 0.84 on the right and 0.26 on the left, suggesting a severe reduction in blood flow to the left leg.  He is currently taking Plavix  and aspirin  as part of his medication regimen. His foot feels warmer when it is down, likely due to gravity aiding blood flow. He does not experience significant pain at rest, but the cold sensation persists.  He recalls previous procedures where he was not fully sedated, describing the experience as painful.    Results Diagnostic Lower extremity ABI (11/02/2024): Right 0.84 decreased from 1.10 on 09/26/2024; Left 0.26 decreased from 1.05 on 09/26/2024. Left lower extremity shows occlusion from left SFA to distal popliteal    Review of Systems  Cardiovascular:        Claudication  All other systems reviewed and are negative.       Objective:   Physical Exam Vitals reviewed.  HENT:     Head: Normocephalic.  Cardiovascular:     Rate and Rhythm: Normal rate.     Pulses:          Dorsalis pedis pulses are detected w/ Doppler on the right side and 0 on the left side.       Posterior tibial pulses are detected w/ Doppler on the right side and 0 on the left side.  Pulmonary:     Effort: Pulmonary effort is normal.  Skin:    General: Skin is dry.     Capillary Refill: Capillary refill takes more than 3 seconds.  Neurological:     Mental Status: He is alert and oriented to person, place, and time.  Psychiatric:        Mood and Affect: Mood normal.        Behavior: Behavior normal.        Thought Content: Thought content normal.     Physical Exam    BP 132/78 (BP Location: Left Arm, Patient Position: Sitting, Cuff Size: Normal)   Pulse 73   Resp 18   Ht 5' 10 (1.778 m)   Wt 193 lb 9.6 oz (87.8 kg)   BMI 27.78 kg/m   Past Medical History:  Diagnosis Date   Diabetes mellitus without complication (HCC)    Erectile dysfunction    Hypertension    Peripheral vascular disease    Sleep apnea  Social History   Socioeconomic History   Marital status: Single    Spouse name: Not on file   Number of children: 0   Years of education: Not on file   Highest education level: Not on file  Occupational History   Not on file  Tobacco Use   Smoking status: Every Day    Current packs/day: 2.00    Average packs/day: 2.0 packs/day for 47.0 years (94.0 ttl pk-yrs)    Types: Cigarettes   Smokeless tobacco: Never   Tobacco comments:    SMOKED TODAY   Vaping Use   Vaping status: Never Used  Substance and Sexual Activity   Alcohol use: Yes    Comment: socially   Drug use: Never   Sexual activity: Not on file  Other Topics Concern   Not on file  Social History Narrative   Lives by himself    Social Drivers of Health   Tobacco Use: High Risk (11/02/2024)   Patient History    Smoking Tobacco Use:  Every Day    Smokeless Tobacco Use: Never    Passive Exposure: Not on file  Financial Resource Strain: Low Risk  (10/27/2024)   Received from [facility]   Overall Financial Resource Strain (CARDIA)    Difficulty of Paying Living Expenses: Not hard at all  Food Insecurity: No Food Insecurity (10/27/2024)   Received from [facility]   Epic    Within the past 12 months, you worried that your food would run out before you got the money to buy more.: Never true    Within the past 12 months, the food you bought just didn't last and you didn't have money to get more.: Never true  Transportation Needs: No Transportation Needs (10/27/2024)   Received from [facility] - Transportation    In the past 12 months, has lack of transportation kept you from medical appointments or from getting medications?: No    Lack of Transportation (Non-Medical): No  Physical Activity: Not on file  Stress: Not on file  Social Connections: Not on file  Intimate Partner Violence: Not on file  Depression (EYV7-0): Not on file  Alcohol Screen: Not on file  Housing: Low Risk  (10/27/2024)   Received from [facility]   Epic    In the last 12 months, was there a time when you were not able to pay the mortgage or rent on time?: No    In the past 12 months, how many times have you moved where you were living?: 0    At any time in the past 12 months, were you homeless or living in a shelter (including now)?: No  Utilities: Not At Risk (10/27/2024)   Received from [facility]   Epic    In the past 12 months has the electric, gas, oil, or water company threatened to shut off services in your home?: No  Health Literacy: Not on file    Past Surgical History:  Procedure Laterality Date   CATARACT EXTRACTION W/PHACO Left 06/24/2022   Procedure: CATARACT EXTRACTION PHACO AND INTRAOCULAR LENS PLACEMENT (IOC) LEFT DIABETIC;   Surgeon: Mittie Gaskin, MD;  Location: [facility];  Service: Ophthalmology;  Laterality: Left;  7.32 1:15.4   COLONOSCOPY N/A 03/14/2024   Procedure: COLONOSCOPY;  Surgeon: Toledo, Ladell POUR, MD;  Location: [facility];  Service: Gastroenterology;  Laterality: N/A;  TRULICITY   ESOPHAGOGASTRODUODENOSCOPY N/A 03/14/2024   Procedure:  EGD (ESOPHAGOGASTRODUODENOSCOPY);  Surgeon: Toledo, Ladell POUR, MD;  Location: [facility];  Service: Gastroenterology;  Laterality: N/A;   EYE SURGERY     LOWER EXTREMITY ANGIOGRAPHY Left 08/27/2020   Procedure: LOWER EXTREMITY ANGIOGRAPHY;  Surgeon: Jama Cordella MATSU, MD;  Location: [facility];  Service: Cardiovascular;  Laterality: Left;   LOWER EXTREMITY ANGIOGRAPHY Left 09/03/2020   Procedure: LOWER EXTREMITY ANGIOGRAPHY;  Surgeon: Jama Cordella MATSU, MD;  Location: [facility];  Service: Cardiovascular;  Laterality: Left;   LOWER EXTREMITY ANGIOGRAPHY Left 07/14/2021   Procedure: LOWER EXTREMITY ANGIOGRAPHY;  Surgeon: Marea Selinda RAMAN, MD;  Location: [facility];  Service: Cardiovascular;  Laterality: Left;   LOWER EXTREMITY ANGIOGRAPHY Left 07/15/2021   Procedure: Lower Extremity Angiography;  Surgeon: Jama Cordella MATSU, MD;  Location: [facility];  Service: Cardiovascular;  Laterality: Left;   LOWER EXTREMITY ANGIOGRAPHY Right 05/27/2023   Procedure: Lower Extremity Angiography;  Surgeon: Marea Selinda RAMAN, MD;  Location: [facility];  Service: Cardiovascular;  Laterality: Right;   MALONEY DILATION  03/14/2024   Procedure: DILATION, ESOPHAGUS, USING MALONEY DILATOR;  Surgeon: Aundria, Ladell POUR, MD;  Location: [facility];  Service: Gastroenterology;;   NECK SURGERY     POLYPECTOMY  03/14/2024   Procedure: POLYPECTOMY, INTESTINE;  Surgeon: Aundria Ladell POUR, MD;  Location: [facility];  Service: Gastroenterology;;   THROMBECTOMY FEMORAL ARTERY Left 09/04/2020   Procedure: THROMBECTOMY FEMORAL  ARTERY possible stent;  Surgeon: Jama Cordella MATSU, MD;  Location: [facility];  Service: Vascular;  Laterality: Left;    Family History  Problem Relation Age of Onset   Cancer Father    Vascular Disease Sister     Allergies[1]     Latest Ref Rng & Units 07/15/2021   12:17 PM 07/15/2021    6:06 AM 07/15/2021   12:18 AM  CBC  WBC 4.0 - 10.5 K/uL 8.1  7.1  8.2   Hemoglobin 13.0 - 17.0 g/dL 87.8  87.5  86.7   Hematocrit 39.0 - 52.0 % 33.6  34.7  36.5   Platelets 150 - 400 K/uL 102  125  146       CMP     Component Value Date/Time   NA 135 07/16/2021 0454   NA 141 03/16/2014 1656   K 3.5 07/16/2021 1336   K 4.0 03/16/2014 1656   CL 99 07/16/2021 0454   CL 106 03/16/2014 1656   CO2 30 07/16/2021 0454   CO2 29 03/16/2014 1656   GLUCOSE 124 (H) 07/16/2021 0454   GLUCOSE 79 03/16/2014 1656   BUN 18 05/27/2023 1222   BUN 16 03/16/2014 1656   CREATININE 1.26 (H) 05/27/2023 1222   CREATININE 0.97 03/16/2014 1656   CALCIUM  7.6 (L) 07/16/2021 0454   CALCIUM  8.9 03/16/2014 1656   PROT 5.8 (L) 09/09/2020 0541   PROT 7.6 03/16/2014 1656   ALBUMIN 2.4 (L) 09/09/2020 0541   ALBUMIN 3.7 03/16/2014 1656   AST 19 09/09/2020 0541   AST 25 03/16/2014 1656   ALT 16 09/09/2020 0541   ALT 43 03/16/2014 1656   ALKPHOS 32 (L) 09/09/2020 0541   ALKPHOS 68 03/16/2014 1656   BILITOT 0.6 09/09/2020 0541   BILITOT 0.3 03/16/2014 1656   GFRNONAA >60 05/27/2023 1222   GFRNONAA >60 03/16/2014 1656     VAS US  ABI WITH/WO TBI Result Date: 09/27/2024  LOWER EXTREMITY DOPPLER STUDY Patient Name:  AUNDRAY CARTLIDGE  Date of Exam:   09/26/2024 Medical Rec #: [id number]  Accession #:    [id number] Date of Birth: November 28, 1955      Patient Gender: M Patient Age:   45 years Exam Location:  [facility] Procedure:      VAS US  ABI WITH/WO TBI Referring Phys: JASON DEW --------------------------------------------------------------------------------  Indications: Rest pain, and peripheral artery  disease.  Vascular Interventions: 08/27/2020: Crosser Atherectomy of the Left SFA and                         Popliteal Artery. PTA and Stent placement of the Left                         SFA and Popliteal Artery. PTA of the Profunda Femoral                         Artery.                         09/03/2020 Left SFA thrombectomy with SFA and pop                         stents.                          09/04/2020: Endart Lt CFA, PFA and SFA. Lt SFA and                         Popliteal Artery Thrombectomy and Stents.                          07/14/2021: Mechanical Thrombectomy to the Left SFA and                         Popliteal Artery.                          07/15/2021: PTA and Stent Left SFA and Popliteal Artery.                         04/2023 Mechanical thrombectomy with the Rota Rex device                         to the right SFA and popliteal arteries                         5. Percutaneous transluminal angioplasty of the right                         SFA and most proximal popliteal artery with 5 mm                         diameter by 22 cm length Lutonix drug-coated angioplasty                         balloon                         6. Stent placement to the distal SFA with 7 mm diameter  by 12 cm length life stent. Comparison Study: 03/2024 Performing Technologist: Jerel Croak RVT  Examination Guidelines: A complete evaluation includes at minimum, Doppler waveform signals and systolic blood pressure reading at the level of bilateral brachial, anterior tibial, and posterior tibial arteries, when vessel segments are accessible. Bilateral testing is considered an integral part of a complete examination. Photoelectric Plethysmograph (PPG) waveforms and toe systolic pressure readings are included as required and additional duplex testing as needed. Limited examinations for reoccurring indications may be performed as noted.  ABI Findings:  +---------+------------------+-----+---------+--------+ Right    Rt Pressure (mmHg)IndexWaveform Comment  +---------+------------------+-----+---------+--------+ Brachial 155                                      +---------+------------------+-----+---------+--------+ PTA      172               1.10 triphasic         +---------+------------------+-----+---------+--------+ DP       154               0.98 triphasic         +---------+------------------+-----+---------+--------+ Great Toe166               1.06 Normal            +---------+------------------+-----+---------+--------+ +---------+------------------+-----+---------+-------+ Left     Lt Pressure (mmHg)IndexWaveform Comment +---------+------------------+-----+---------+-------+ Brachial 157                                     +---------+------------------+-----+---------+-------+ PTA      165               1.05 triphasic        +---------+------------------+-----+---------+-------+ DP       161               1.03 triphasic        +---------+------------------+-----+---------+-------+ Great Toe174               1.11 Normal           +---------+------------------+-----+---------+-------+ +-------+-----------+-----------+------------+------------+ ABI/TBIToday's ABIToday's TBIPrevious ABIPrevious TBI +-------+-----------+-----------+------------+------------+ Right  1.10       1.06       1.19        1.20         +-------+-----------+-----------+------------+------------+ Left   1.05       1.11       1.27        1.20         +-------+-----------+-----------+------------+------------+  Summary: Right: Resting right ankle-brachial index is within normal range. The right toe-brachial index is normal.  Left: Resting left ankle-brachial index is within normal range. The left toe-brachial index is normal.  *See table(s) above for measurements and observations.  Electronically signed by Selinda Gu MD on 09/27/2024 at 11:10:21 AM.    Final        Assessment & Plan:   1. Atherosclerosis of native artery of left lower extremity with rest pain (HCC) (Primary) Peripheral artery disease (PAD) with acute limb ischemia Acute limb ischemia in the left foot due to significant blockage in stents behind the knee. ABI dropped to 0.26 on the left, indicating severe blood flow reduction. Urgent intervention required to restore blood flow and prevent complications. - Schedule angiography and possible thrombectomy on Monday.Discussed risks benefits and alternatives and patient  agreeable to proceed - Continue Aspirin  and Clopidogrel . - Prescribe pain medication for weekend management. - Advise walking as tolerated to promote circulation. - Instruct to keep foot down to aid blood flow.   2. Essential (primary) hypertension Continue antihypertensive medications as already ordered, these medications have been reviewed and there are no changes at this time.  3. Type 2 diabetes mellitus with hyperglycemia, without long-term current use of insulin (HCC) Continue hypoglycemic medications as already ordered, these medications have been reviewed and there are no changes at this time.  Hgb A1C to be monitored as already arranged by primary service  4. Tobacco user Smoking cessation was discussed, 3-10 minutes spent on this topic specifically   Assessment and Plan Assessment & Plan      Medications Ordered Prior to Encounter[2]  There are no Patient Instructions on file for this visit. No follow-ups on file.   Candance Bohlman E Hyrum Shaneyfelt, NP      [1]  Allergies Allergen Reactions   Oxycodone -Acetaminophen  Itching  [2]  Current Outpatient Medications on File Prior to Visit  Medication Sig Dispense Refill   ASPIRIN  81 PO Take by mouth daily.     atorvastatin  (LIPITOR) 10 MG tablet TAKE 1 TABLET(10 MG) BY MOUTH DAILY 90 tablet 1   clopidogrel  (PLAVIX ) 75 MG tablet TAKE 1 TABLET BY MOUTH EVERY  DAY 90 tablet 3   DTx App - Wellness (GLP-1 COMPANION) KIT by Does not apply route.     gabapentin  (NEURONTIN ) 300 MG capsule TAKE 1 CAPSULE BY MOUTH EVERY MORNING AND 2 CAPSULES EVERY EVENING 270 capsule 3   hydrochlorothiazide  (HYDRODIURIL ) 25 MG tablet Take 25 mg by mouth daily.      losartan (COZAAR) 25 MG tablet Take 25 mg by mouth daily.     metoprolol  succinate (TOPROL -XL) 100 MG 24 hr tablet Take 100 mg by mouth daily.      pantoprazole  (PROTONIX ) 40 MG tablet Take by mouth.     sitaGLIPtin (JANUVIA) 100 MG tablet Take 100 mg by mouth daily.     No current facility-administered medications on file prior to visit.

## 2024-11-02 NOTE — Progress Notes (Signed)
 Subjective:    Patient ID: Carl Norris, male    DOB: May 27, 1956, 68 y.o.   MRN: [id number] Chief Complaint  Patient presents with   Follow-up    Follow up ABI     HPI  Discussed the use of AI scribe software for clinical note transcription with the patient, who gave verbal consent to proceed.  History of Present Illness Carl Norris is a 68 year old male with peripheral artery disease who presents with cold and tingling sensations in his left foot.  He began experiencing cold and tingling sensations in his left foot yesterday after spending about an hour working on the floor. The symptoms intensified after having supper at his sister's house. The foot feels cold and tingling, with pain primarily occurring during ambulation. He can walk a significant distance before the pain intensifies, but he does not experience pain while sitting still or during sleep.  He has a history of interventions on his left leg, including stent placements. His last visit on September 26, 2024, showed an ankle-brachial index (ABI) of 1.10 on the right and 1.05 on the left, both within normal limits. Recent measurements indicate a drop to 0.84 on the right and 0.26 on the left, suggesting a severe reduction in blood flow to the left leg.  He is currently taking Plavix  and aspirin  as part of his medication regimen. His foot feels warmer when it is down, likely due to gravity aiding blood flow. He does not experience significant pain at rest, but the cold sensation persists.  He recalls previous procedures where he was not fully sedated, describing the experience as painful.    Results Diagnostic Lower extremity ABI (11/02/2024): Right 0.84 decreased from 1.10 on 09/26/2024; Left 0.26 decreased from 1.05 on 09/26/2024. Left lower extremity shows occlusion from left SFA to distal popliteal    Review of Systems  Cardiovascular:        Claudication  All other systems reviewed and are negative.       Objective:   Physical Exam Vitals reviewed.  HENT:     Head: Normocephalic.  Cardiovascular:     Rate and Rhythm: Normal rate.     Pulses:          Dorsalis pedis pulses are detected w/ Doppler on the right side and 0 on the left side.       Posterior tibial pulses are detected w/ Doppler on the right side and 0 on the left side.  Pulmonary:     Effort: Pulmonary effort is normal.  Skin:    General: Skin is dry.     Capillary Refill: Capillary refill takes more than 3 seconds.  Neurological:     Mental Status: He is alert and oriented to person, place, and time.  Psychiatric:        Mood and Affect: Mood normal.        Behavior: Behavior normal.        Thought Content: Thought content normal.     Physical Exam    BP 132/78 (BP Location: Left Arm, Patient Position: Sitting, Cuff Size: Normal)   Pulse 73   Resp 18   Ht 5' 10 (1.778 m)   Wt 193 lb 9.6 oz (87.8 kg)   BMI 27.78 kg/m   Past Medical History:  Diagnosis Date   Diabetes mellitus without complication (HCC)    Erectile dysfunction    Hypertension    Peripheral vascular disease    Sleep apnea  Social History   Socioeconomic History   Marital status: Single    Spouse name: Not on file   Number of children: 0   Years of education: Not on file   Highest education level: Not on file  Occupational History   Not on file  Tobacco Use   Smoking status: Every Day    Current packs/day: 2.00    Average packs/day: 2.0 packs/day for 47.0 years (94.0 ttl pk-yrs)    Types: Cigarettes   Smokeless tobacco: Never   Tobacco comments:    SMOKED TODAY   Vaping Use   Vaping status: Never Used  Substance and Sexual Activity   Alcohol use: Yes    Comment: socially   Drug use: Never   Sexual activity: Not on file  Other Topics Concern   Not on file  Social History Narrative   Lives by himself    Social Drivers of Health   Tobacco Use: High Risk (11/02/2024)   Patient History    Smoking Tobacco Use:  Every Day    Smokeless Tobacco Use: Never    Passive Exposure: Not on file  Financial Resource Strain: Low Risk  (10/27/2024)   Received from [facility]   Overall Financial Resource Strain (CARDIA)    Difficulty of Paying Living Expenses: Not hard at all  Food Insecurity: No Food Insecurity (10/27/2024)   Received from [facility]   Epic    Within the past 12 months, you worried that your food would run out before you got the money to buy more.: Never true    Within the past 12 months, the food you bought just didn't last and you didn't have money to get more.: Never true  Transportation Needs: No Transportation Needs (10/27/2024)   Received from [facility] - Transportation    In the past 12 months, has lack of transportation kept you from medical appointments or from getting medications?: No    Lack of Transportation (Non-Medical): No  Physical Activity: Not on file  Stress: Not on file  Social Connections: Not on file  Intimate Partner Violence: Not on file  Depression (EYV7-0): Not on file  Alcohol Screen: Not on file  Housing: Low Risk  (10/27/2024)   Received from [facility]   Epic    In the last 12 months, was there a time when you were not able to pay the mortgage or rent on time?: No    In the past 12 months, how many times have you moved where you were living?: 0    At any time in the past 12 months, were you homeless or living in a shelter (including now)?: No  Utilities: Not At Risk (10/27/2024)   Received from [facility]   Epic    In the past 12 months has the electric, gas, oil, or water company threatened to shut off services in your home?: No  Health Literacy: Not on file    Past Surgical History:  Procedure Laterality Date   CATARACT EXTRACTION W/PHACO Left 06/24/2022   Procedure: CATARACT EXTRACTION PHACO AND INTRAOCULAR LENS PLACEMENT (IOC) LEFT DIABETIC;   Surgeon: Mittie Gaskin, MD;  Location: [facility];  Service: Ophthalmology;  Laterality: Left;  7.32 1:15.4   COLONOSCOPY N/A 03/14/2024   Procedure: COLONOSCOPY;  Surgeon: Toledo, Ladell POUR, MD;  Location: [facility];  Service: Gastroenterology;  Laterality: N/A;  TRULICITY   ESOPHAGOGASTRODUODENOSCOPY N/A 03/14/2024   Procedure:  EGD (ESOPHAGOGASTRODUODENOSCOPY);  Surgeon: Toledo, Ladell POUR, MD;  Location: [facility] END[facility];  Service: Gastroenterology;  Laterality: N/A;   EYE SURGERY     LOWER EXTREMITY ANGIOGRAPHY Left 08/27/2020   Procedure: LOWER EXTREMITY ANGIOGRAPHY;  Surgeon: Jama Cordella MATSU, MD;  Location: [facility];  Service: Cardiovascular;  Laterality: Left;   LOWER EXTREMITY ANGIOGRAPHY Left 09/03/2020   Procedure: LOWER EXTREMITY ANGIOGRAPHY;  Surgeon: Jama Cordella MATSU, MD;  Location: [facility];  Service: Cardiovascular;  Laterality: Left;   LOWER EXTREMITY ANGIOGRAPHY Left 07/14/2021   Procedure: LOWER EXTREMITY ANGIOGRAPHY;  Surgeon: Marea Selinda RAMAN, MD;  Location: [facility];  Service: Cardiovascular;  Laterality: Left;   LOWER EXTREMITY ANGIOGRAPHY Left 07/15/2021   Procedure: Lower Extremity Angiography;  Surgeon: Jama Cordella MATSU, MD;  Location: [facility];  Service: Cardiovascular;  Laterality: Left;   LOWER EXTREMITY ANGIOGRAPHY Right 05/27/2023   Procedure: Lower Extremity Angiography;  Surgeon: Marea Selinda RAMAN, MD;  Location: [facility];  Service: Cardiovascular;  Laterality: Right;   MALONEY DILATION  03/14/2024   Procedure: DILATION, ESOPHAGUS, USING MALONEY DILATOR;  Surgeon: Aundria, Ladell POUR, MD;  Location: [facility];  Service: Gastroenterology;;   NECK SURGERY     POLYPECTOMY  03/14/2024   Procedure: POLYPECTOMY, INTESTINE;  Surgeon: Aundria Ladell POUR, MD;  Location: [facility];  Service: Gastroenterology;;   THROMBECTOMY FEMORAL ARTERY Left 09/04/2020   Procedure: THROMBECTOMY FEMORAL  ARTERY possible stent;  Surgeon: Jama Cordella MATSU, MD;  Location: [facility];  Service: Vascular;  Laterality: Left;    Family History  Problem Relation Age of Onset   Cancer Father    Vascular Disease Sister     Allergies[1]     Latest Ref Rng & Units 07/15/2021   12:17 PM 07/15/2021    6:06 AM 07/15/2021   12:18 AM  CBC  WBC 4.0 - 10.5 K/uL 8.1  7.1  8.2   Hemoglobin 13.0 - 17.0 g/dL 87.8  87.5  86.7   Hematocrit 39.0 - 52.0 % 33.6  34.7  36.5   Platelets 150 - 400 K/uL 102  125  146       CMP     Component Value Date/Time   NA 135 07/16/2021 0454   NA 141 03/16/2014 1656   K 3.5 07/16/2021 1336   K 4.0 03/16/2014 1656   CL 99 07/16/2021 0454   CL 106 03/16/2014 1656   CO2 30 07/16/2021 0454   CO2 29 03/16/2014 1656   GLUCOSE 124 (H) 07/16/2021 0454   GLUCOSE 79 03/16/2014 1656   BUN 18 05/27/2023 1222   BUN 16 03/16/2014 1656   CREATININE 1.26 (H) 05/27/2023 1222   CREATININE 0.97 03/16/2014 1656   CALCIUM  7.6 (L) 07/16/2021 0454   CALCIUM  8.9 03/16/2014 1656   PROT 5.8 (L) 09/09/2020 0541   PROT 7.6 03/16/2014 1656   ALBUMIN 2.4 (L) 09/09/2020 0541   ALBUMIN 3.7 03/16/2014 1656   AST 19 09/09/2020 0541   AST 25 03/16/2014 1656   ALT 16 09/09/2020 0541   ALT 43 03/16/2014 1656   ALKPHOS 32 (L) 09/09/2020 0541   ALKPHOS 68 03/16/2014 1656   BILITOT 0.6 09/09/2020 0541   BILITOT 0.3 03/16/2014 1656   GFRNONAA >60 05/27/2023 1222   GFRNONAA >60 03/16/2014 1656     VAS US  ABI WITH/WO TBI Result Date: 09/27/2024  LOWER EXTREMITY DOPPLER STUDY Patient Name:  Carl Norris  Date of Exam:   09/26/2024 Medical Rec #: [id number]  Accession #:    [id number] Date of Birth: November 28, 1955      Patient Gender: M Patient Age:   45 years Exam Location:  [facility] Procedure:      VAS US  ABI WITH/WO TBI Referring Phys: JASON DEW --------------------------------------------------------------------------------  Indications: Rest pain, and peripheral artery  disease.  Vascular Interventions: 08/27/2020: Crosser Atherectomy of the Left SFA and                         Popliteal Artery. PTA and Stent placement of the Left                         SFA and Popliteal Artery. PTA of the Profunda Femoral                         Artery.                         09/03/2020 Left SFA thrombectomy with SFA and pop                         stents.                          09/04/2020: Endart Lt CFA, PFA and SFA. Lt SFA and                         Popliteal Artery Thrombectomy and Stents.                          07/14/2021: Mechanical Thrombectomy to the Left SFA and                         Popliteal Artery.                          07/15/2021: PTA and Stent Left SFA and Popliteal Artery.                         04/2023 Mechanical thrombectomy with the Rota Rex device                         to the right SFA and popliteal arteries                         5. Percutaneous transluminal angioplasty of the right                         SFA and most proximal popliteal artery with 5 mm                         diameter by 22 cm length Lutonix drug-coated angioplasty                         balloon                         6. Stent placement to the distal SFA with 7 mm diameter  by 12 cm length life stent. Comparison Study: 03/2024 Performing Technologist: Jerel Croak RVT  Examination Guidelines: A complete evaluation includes at minimum, Doppler waveform signals and systolic blood pressure reading at the level of bilateral brachial, anterior tibial, and posterior tibial arteries, when vessel segments are accessible. Bilateral testing is considered an integral part of a complete examination. Photoelectric Plethysmograph (PPG) waveforms and toe systolic pressure readings are included as required and additional duplex testing as needed. Limited examinations for reoccurring indications may be performed as noted.  ABI Findings:  +---------+------------------+-----+---------+--------+ Right    Rt Pressure (mmHg)IndexWaveform Comment  +---------+------------------+-----+---------+--------+ Brachial 155                                      +---------+------------------+-----+---------+--------+ PTA      172               1.10 triphasic         +---------+------------------+-----+---------+--------+ DP       154               0.98 triphasic         +---------+------------------+-----+---------+--------+ Great Toe166               1.06 Normal            +---------+------------------+-----+---------+--------+ +---------+------------------+-----+---------+-------+ Left     Lt Pressure (mmHg)IndexWaveform Comment +---------+------------------+-----+---------+-------+ Brachial 157                                     +---------+------------------+-----+---------+-------+ PTA      165               1.05 triphasic        +---------+------------------+-----+---------+-------+ DP       161               1.03 triphasic        +---------+------------------+-----+---------+-------+ Great Toe174               1.11 Normal           +---------+------------------+-----+---------+-------+ +-------+-----------+-----------+------------+------------+ ABI/TBIToday's ABIToday's TBIPrevious ABIPrevious TBI +-------+-----------+-----------+------------+------------+ Right  1.10       1.06       1.19        1.20         +-------+-----------+-----------+------------+------------+ Left   1.05       1.11       1.27        1.20         +-------+-----------+-----------+------------+------------+  Summary: Right: Resting right ankle-brachial index is within normal range. The right toe-brachial index is normal.  Left: Resting left ankle-brachial index is within normal range. The left toe-brachial index is normal.  *See table(s) above for measurements and observations.  Electronically signed by Selinda Gu MD on 09/27/2024 at 11:10:21 AM.    Final        Assessment & Plan:   1. Atherosclerosis of native artery of left lower extremity with rest pain (HCC) (Primary) Peripheral artery disease (PAD) with acute limb ischemia Acute limb ischemia in the left foot due to significant blockage in stents behind the knee. ABI dropped to 0.26 on the left, indicating severe blood flow reduction. Urgent intervention required to restore blood flow and prevent complications. - Schedule angiography and possible thrombectomy on Monday.Discussed risks benefits and alternatives and patient  agreeable to proceed - Continue Aspirin  and Clopidogrel . - Prescribe pain medication for weekend management. - Advise walking as tolerated to promote circulation. - Instruct to keep foot down to aid blood flow.   2. Essential (primary) hypertension Continue antihypertensive medications as already ordered, these medications have been reviewed and there are no changes at this time.  3. Type 2 diabetes mellitus with hyperglycemia, without long-term current use of insulin (HCC) Continue hypoglycemic medications as already ordered, these medications have been reviewed and there are no changes at this time.  Hgb A1C to be monitored as already arranged by primary service  4. Tobacco user Smoking cessation was discussed, 3-10 minutes spent on this topic specifically   Assessment and Plan Assessment & Plan      Medications Ordered Prior to Encounter[2]  There are no Patient Instructions on file for this visit. No follow-ups on file.   Candance Bohlman E Hyrum Shaneyfelt, NP      [1]  Allergies Allergen Reactions   Oxycodone -Acetaminophen  Itching  [2]  Current Outpatient Medications on File Prior to Visit  Medication Sig Dispense Refill   ASPIRIN  81 PO Take by mouth daily.     atorvastatin  (LIPITOR) 10 MG tablet TAKE 1 TABLET(10 MG) BY MOUTH DAILY 90 tablet 1   clopidogrel  (PLAVIX ) 75 MG tablet TAKE 1 TABLET BY MOUTH EVERY  DAY 90 tablet 3   DTx App - Wellness (GLP-1 COMPANION) KIT by Does not apply route.     gabapentin  (NEURONTIN ) 300 MG capsule TAKE 1 CAPSULE BY MOUTH EVERY MORNING AND 2 CAPSULES EVERY EVENING 270 capsule 3   hydrochlorothiazide  (HYDRODIURIL ) 25 MG tablet Take 25 mg by mouth daily.      losartan (COZAAR) 25 MG tablet Take 25 mg by mouth daily.     metoprolol  succinate (TOPROL -XL) 100 MG 24 hr tablet Take 100 mg by mouth daily.      pantoprazole  (PROTONIX ) 40 MG tablet Take by mouth.     sitaGLIPtin (JANUVIA) 100 MG tablet Take 100 mg by mouth daily.     No current facility-administered medications on file prior to visit.

## 2024-11-03 ENCOUNTER — Telehealth (INDEPENDENT_AMBULATORY_CARE_PROVIDER_SITE_OTHER): Payer: Self-pay

## 2024-11-03 NOTE — Telephone Encounter (Signed)
 Spoke with the patient and he has been scheduled for a LLE angio with a 8:15 am arrival time to the [facility]. Pre-procedure instructions were discussed and will be sent to Mychart.

## 2024-11-06 ENCOUNTER — Encounter: Payer: Self-pay | Admitting: Vascular Surgery

## 2024-11-06 ENCOUNTER — Inpatient Hospital Stay
Admission: RE | Admit: 2024-11-06 | Discharge: 2024-11-07 | DRG: 272 | Disposition: A | Discharge status: Home / Self Care | Attending: Vascular Surgery | Admitting: Vascular Surgery

## 2024-11-06 ENCOUNTER — Other Ambulatory Visit: Payer: Self-pay

## 2024-11-06 ENCOUNTER — Encounter: Admission: RE | Disposition: A | Payer: Self-pay | Source: Home / Self Care | Attending: Vascular Surgery

## 2024-11-06 DIAGNOSIS — Z7982 Long term (current) use of aspirin: Secondary | ICD-10-CM | POA: Diagnosis not present

## 2024-11-06 DIAGNOSIS — I70221 Atherosclerosis of native arteries of extremities with rest pain, right leg: Secondary | ICD-10-CM | POA: Diagnosis not present

## 2024-11-06 DIAGNOSIS — I70222 Atherosclerosis of native arteries of extremities with rest pain, left leg: Secondary | ICD-10-CM | POA: Diagnosis present

## 2024-11-06 DIAGNOSIS — Z716 Tobacco abuse counseling: Secondary | ICD-10-CM | POA: Diagnosis not present

## 2024-11-06 DIAGNOSIS — Z23 Encounter for immunization: Secondary | ICD-10-CM

## 2024-11-06 DIAGNOSIS — E1151 Type 2 diabetes mellitus with diabetic peripheral angiopathy without gangrene: Principal | ICD-10-CM | POA: Diagnosis present

## 2024-11-06 DIAGNOSIS — E1165 Type 2 diabetes mellitus with hyperglycemia: Secondary | ICD-10-CM | POA: Diagnosis present

## 2024-11-06 DIAGNOSIS — Z7984 Long term (current) use of oral hypoglycemic drugs: Secondary | ICD-10-CM | POA: Diagnosis not present

## 2024-11-06 DIAGNOSIS — I998 Other disorder of circulatory system: Principal | ICD-10-CM | POA: Diagnosis present

## 2024-11-06 DIAGNOSIS — F17210 Nicotine dependence, cigarettes, uncomplicated: Secondary | ICD-10-CM | POA: Diagnosis present

## 2024-11-06 DIAGNOSIS — Z7902 Long term (current) use of antithrombotics/antiplatelets: Secondary | ICD-10-CM

## 2024-11-06 DIAGNOSIS — I743 Embolism and thrombosis of arteries of the lower extremities: Secondary | ICD-10-CM | POA: Diagnosis not present

## 2024-11-06 DIAGNOSIS — I10 Essential (primary) hypertension: Secondary | ICD-10-CM | POA: Diagnosis present

## 2024-11-06 DIAGNOSIS — Z95820 Peripheral vascular angioplasty status with implants and grafts: Secondary | ICD-10-CM | POA: Diagnosis not present

## 2024-11-06 DIAGNOSIS — Z95828 Presence of other vascular implants and grafts: Secondary | ICD-10-CM

## 2024-11-06 DIAGNOSIS — Z9889 Other specified postprocedural states: Secondary | ICD-10-CM

## 2024-11-06 DIAGNOSIS — I739 Peripheral vascular disease, unspecified: Secondary | ICD-10-CM

## 2024-11-06 DIAGNOSIS — Z79899 Other long term (current) drug therapy: Secondary | ICD-10-CM

## 2024-11-06 DIAGNOSIS — I70229 Atherosclerosis of native arteries of extremities with rest pain, unspecified extremity: Secondary | ICD-10-CM

## 2024-11-06 DIAGNOSIS — Z885 Allergy status to narcotic agent status: Secondary | ICD-10-CM | POA: Diagnosis not present

## 2024-11-06 HISTORY — PX: LOWER EXTREMITY INTERVENTION: CATH118252

## 2024-11-06 HISTORY — PX: LOWER EXTREMITY ANGIOGRAPHY: CATH118251

## 2024-11-06 LAB — CBC
HCT: 40.6 % (ref 39.0–52.0)
HCT: 39.7 % (ref 39.0–52.0)
Hemoglobin: 13.5 g/dL (ref 13.0–17.0)
Hemoglobin: 13.3 g/dL (ref 13.0–17.0)
MCH: 32.7 pg (ref 26.0–34.0)
MCH: 32.9 pg (ref 26.0–34.0)
MCHC: 33.3 g/dL (ref 30.0–36.0)
MCHC: 33.5 g/dL (ref 30.0–36.0)
MCV: 98.3 fL (ref 80.0–100.0)
MCV: 98.3 fL (ref 80.0–100.0)
Platelets: 146 K/uL — ABNORMAL LOW (ref 150–400)
Platelets: 138 K/uL — ABNORMAL LOW (ref 150–400)
RBC: 4.13 MIL/uL — ABNORMAL LOW (ref 4.22–5.81)
RBC: 4.04 MIL/uL — ABNORMAL LOW (ref 4.22–5.81)
RDW: 13.1 % (ref 11.5–15.5)
RDW: 13.1 % (ref 11.5–15.5)
WBC: 8.4 K/uL (ref 4.0–10.5)
WBC: 6 K/uL (ref 4.0–10.5)
nRBC: 0 % (ref 0.0–0.2)
nRBC: 0 % (ref 0.0–0.2)

## 2024-11-06 LAB — VAS US ABI WITH/WO TBI
Left ABI: 0.26
Right ABI: 0.84

## 2024-11-06 LAB — FIBRINOGEN
Fibrinogen: 490 mg/dL — ABNORMAL HIGH (ref 210–475)
Fibrinogen: 358 mg/dL (ref 210–475)

## 2024-11-06 LAB — CREATININE, SERUM
Creatinine, Ser: 1.12 mg/dL (ref 0.61–1.24)
GFR, Estimated: >60 mL/min

## 2024-11-06 LAB — GLUCOSE, CAPILLARY
Glucose-Capillary: 120 mg/dL — ABNORMAL HIGH (ref 70–99)
Glucose-Capillary: 106 mg/dL — ABNORMAL HIGH (ref 70–99)
Glucose-Capillary: 132 mg/dL — ABNORMAL HIGH (ref 70–99)

## 2024-11-06 LAB — HEPARIN LEVEL (UNFRACTIONATED)
Heparin Unfractionated: <0.1 [IU]/mL — ABNORMAL LOW (ref 0.30–0.70)
Heparin Unfractionated: <0.1 [IU]/mL — ABNORMAL LOW (ref 0.30–0.70)

## 2024-11-06 LAB — BUN: BUN: 12 mg/dL (ref 8–23)

## 2024-11-06 LAB — MRSA NEXT GEN BY PCR, NASAL: MRSA by PCR Next Gen: NOT DETECTED

## 2024-11-06 SURGERY — LOWER EXTREMITY INTERVENTION
Anesthesia: Moderate Sedation | Site: Leg Lower | Laterality: Left

## 2024-11-06 MED ORDER — HYDROMORPHONE HCL 1 MG/ML IJ SOLN: 1.0000 mg | Freq: Once | INTRAMUSCULAR | Status: DC | PRN

## 2024-11-06 MED ORDER — SODIUM CHLORIDE 0.9 % IV SOLN
0.5000 mg/h | INTRAVENOUS | Status: AC
  Administered 2024-11-07: 0.5 mg/h
  Filled 2024-11-06 (×3): qty 10

## 2024-11-06 MED ORDER — ASPIRIN 81 MG PO CHEW
81.0000 mg | CHEWABLE_TABLET | Freq: Every day | ORAL | Status: DC
  Administered 2024-11-06 – 2024-11-07 (×2): 81 mg via ORAL
  Filled 2024-11-06 (×2): qty 1

## 2024-11-06 MED ORDER — HYDROCODONE-ACETAMINOPHEN 5-325 MG PO TABS: 1.0000 | ORAL_TABLET | Freq: Four times a day (QID) | ORAL | Status: DC | PRN

## 2024-11-06 MED ORDER — GABAPENTIN 300 MG PO CAPS
600.0000 mg | ORAL_CAPSULE | Freq: Two times a day (BID) | ORAL | Status: DC
  Administered 2024-11-06 – 2024-11-07 (×2): 600 mg via ORAL
  Filled 2024-11-06 (×2): qty 2

## 2024-11-06 MED ORDER — SODIUM CHLORIDE 0.9 % IV SOLN: INTRAVENOUS | Status: DC

## 2024-11-06 MED ORDER — MIDAZOLAM HCL 5 MG/5ML IJ SOLN
INTRAMUSCULAR | Status: AC
  Filled 2024-11-06: qty 5

## 2024-11-06 MED ORDER — LOSARTAN POTASSIUM 25 MG PO TABS: 25.0000 mg | ORAL_TABLET | Freq: Every day | ORAL | Status: DC

## 2024-11-06 MED ORDER — FAMOTIDINE 20 MG PO TABS: 40.0000 mg | ORAL_TABLET | Freq: Once | ORAL | Status: DC | PRN

## 2024-11-06 MED ORDER — HYDRALAZINE HCL 20 MG/ML IJ SOLN
10.0000 mg | INTRAMUSCULAR | Status: DC | PRN
  Administered 2024-11-06: 10 mg via INTRAVENOUS
  Filled 2024-11-06: qty 1

## 2024-11-06 MED ORDER — HEPARIN (PORCINE) 25000 UT/250ML-% IV SOLN
600.0000 [IU]/h | INTRAVENOUS | Status: DC
  Administered 2024-11-06: 600 [IU]/h via INTRA_ARTERIAL

## 2024-11-06 MED ORDER — DIPHENHYDRAMINE-ZINC ACETATE 2-0.1 % EX CREA
TOPICAL_CREAM | Freq: Three times a day (TID) | CUTANEOUS | Status: DC | PRN
  Filled 2024-11-06: qty 28

## 2024-11-06 MED ORDER — CHLORHEXIDINE GLUCONATE CLOTH 2 % EX PADS
6.0000 | MEDICATED_PAD | Freq: Every day | CUTANEOUS | Status: DC
  Administered 2024-11-07: 6 via TOPICAL

## 2024-11-06 MED ORDER — LIDOCAINE-EPINEPHRINE (PF) 1 %-1:200000 IJ SOLN
INTRAMUSCULAR | Status: DC | PRN
  Administered 2024-11-06: 20 mL

## 2024-11-06 MED ORDER — DIPHENHYDRAMINE HCL 50 MG/ML IJ SOLN: 50.0000 mg | Freq: Once | INTRAMUSCULAR | Status: DC | PRN

## 2024-11-06 MED ORDER — SODIUM CHLORIDE 0.9% FLUSH: 3.0000 mL | INTRAVENOUS | Status: DC | PRN

## 2024-11-06 MED ORDER — IODIXANOL 320 MG/ML IV SOLN
INTRAVENOUS | Status: DC | PRN
  Administered 2024-11-06: 55 mL via INTRA_ARTERIAL

## 2024-11-06 MED ORDER — ACETAMINOPHEN 325 MG PO TABS
650.0000 mg | ORAL_TABLET | Freq: Four times a day (QID) | ORAL | Status: DC | PRN
  Administered 2024-11-06 – 2024-11-07 (×2): 650 mg via ORAL
  Filled 2024-11-06 (×2): qty 2

## 2024-11-06 MED ORDER — LINAGLIPTIN 5 MG PO TABS: 5.0000 mg | ORAL_TABLET | Freq: Every day | ORAL | Status: DC

## 2024-11-06 MED ORDER — METOPROLOL SUCCINATE ER 50 MG PO TB24
100.0000 mg | ORAL_TABLET | Freq: Every day | ORAL | Status: DC
  Administered 2024-11-06 – 2024-11-07 (×2): 100 mg via ORAL
  Filled 2024-11-06 (×2): qty 2

## 2024-11-06 MED ORDER — ALTEPLASE 1 MG/ML SYRINGE FOR VASCULAR PROCEDURE
INTRAMUSCULAR | Status: DC | PRN
  Administered 2024-11-06: 8 mg via INTRA_ARTERIAL

## 2024-11-06 MED ORDER — HEPARIN SODIUM (PORCINE) 1000 UNIT/ML IJ SOLN
INTRAMUSCULAR | Status: DC | PRN
  Administered 2024-11-06: 5000 [IU] via INTRAVENOUS

## 2024-11-06 MED ORDER — CEFAZOLIN SODIUM-DEXTROSE 2-4 GM/100ML-% IV SOLN
2.0000 g | INTRAVENOUS | Status: AC
  Administered 2024-11-06: 2 g via INTRAVENOUS

## 2024-11-06 MED ORDER — HEPARIN (PORCINE) IN NACL 2000-0.9 UNIT/L-% IV SOLN
INTRAVENOUS | Status: DC | PRN
  Administered 2024-11-06: 1000 mL

## 2024-11-06 MED ORDER — SODIUM CHLORIDE 0.9 % IV SOLN
250.0000 mL | INTRAVENOUS | Status: AC | PRN
  Administered 2024-11-06: 250 mL via INTRAVENOUS

## 2024-11-06 MED ORDER — HEPARIN SODIUM (PORCINE) 1000 UNIT/ML IJ SOLN
INTRAMUSCULAR | Status: AC
  Filled 2024-11-06: qty 10

## 2024-11-06 MED ORDER — SODIUM CHLORIDE 0.9% FLUSH
3.0000 mL | Freq: Two times a day (BID) | INTRAVENOUS | Status: DC
  Administered 2024-11-06 (×2): 3 mL via INTRAVENOUS

## 2024-11-06 MED ORDER — PANTOPRAZOLE SODIUM 40 MG PO TBEC
40.0000 mg | DELAYED_RELEASE_TABLET | Freq: Every day | ORAL | Status: DC
  Administered 2024-11-06 – 2024-11-07 (×2): 40 mg via ORAL
  Filled 2024-11-06 (×2): qty 1

## 2024-11-06 MED ORDER — HEPARIN (PORCINE) 25000 UT/250ML-% IV SOLN
INTRAVENOUS | Status: AC
  Filled 2024-11-06: qty 250

## 2024-11-06 MED ORDER — MIDAZOLAM HCL 2 MG/ML PO SYRP: 8.0000 mg | ORAL_SOLUTION | Freq: Once | ORAL | Status: DC | PRN

## 2024-11-06 MED ORDER — HYDROCHLOROTHIAZIDE 25 MG PO TABS
25.0000 mg | ORAL_TABLET | Freq: Every day | ORAL | Status: DC
  Administered 2024-11-07: 25 mg via ORAL
  Filled 2024-11-06: qty 1

## 2024-11-06 MED ORDER — ATORVASTATIN CALCIUM 20 MG PO TABS
40.0000 mg | ORAL_TABLET | Freq: Every day | ORAL | Status: DC
  Administered 2024-11-07: 40 mg via ORAL
  Filled 2024-11-06: qty 2

## 2024-11-06 MED ORDER — MIDAZOLAM HCL (PF) 2 MG/2ML IJ SOLN
INTRAMUSCULAR | Status: DC | PRN
  Administered 2024-11-06: 1 mg via INTRAVENOUS
  Administered 2024-11-06: 2 mg via INTRAVENOUS

## 2024-11-06 MED ORDER — CEFAZOLIN SODIUM-DEXTROSE 2-4 GM/100ML-% IV SOLN
INTRAVENOUS | Status: AC
  Filled 2024-11-06: qty 100

## 2024-11-06 MED ORDER — LOSARTAN POTASSIUM 50 MG PO TABS
25.0000 mg | ORAL_TABLET | Freq: Every day | ORAL | Status: DC
  Administered 2024-11-06 – 2024-11-07 (×2): 25 mg via ORAL
  Filled 2024-11-06 (×2): qty 1

## 2024-11-06 MED ORDER — PNEUMOCOCCAL 20-VAL CONJ VACC 0.5 ML IM SUSY
0.5000 mL | PREFILLED_SYRINGE | INTRAMUSCULAR | Status: AC
  Administered 2024-11-07: 0.5 mL via INTRAMUSCULAR
  Filled 2024-11-06: qty 0.5

## 2024-11-06 MED ORDER — FENTANYL CITRATE (PF) 100 MCG/2ML IJ SOLN
INTRAMUSCULAR | Status: AC
  Filled 2024-11-06: qty 2

## 2024-11-06 MED ORDER — SODIUM CHLORIDE 0.9 % IV SOLN
1.0000 mg/h | INTRAVENOUS | Status: AC
  Administered 2024-11-06: 1 mg/h
  Filled 2024-11-06: qty 10

## 2024-11-06 MED ORDER — ONDANSETRON HCL 4 MG/2ML IJ SOLN: 4.0000 mg | Freq: Four times a day (QID) | INTRAMUSCULAR | Status: DC | PRN

## 2024-11-06 MED ORDER — FENTANYL CITRATE (PF) 100 MCG/2ML IJ SOLN
INTRAMUSCULAR | Status: DC | PRN
  Administered 2024-11-06: 50 ug via INTRAVENOUS
  Administered 2024-11-06: 25 ug via INTRAVENOUS

## 2024-11-06 SURGICAL SUPPLY — 15 items
CANISTER PENUMBRA ENGINE (MISCELLANEOUS) IMPLANT
CATH ANGIO 5F PIGTAIL 65CM (CATHETERS) IMPLANT
CATH BEACON 5 .038 100 VERT TP (CATHETERS) IMPLANT
CATH INFUS 135X50 (CATHETERS) IMPLANT
CATH LIGHTNING BOLT 7 130 (CATHETERS) IMPLANT
COVER PROBE ULTRASOUND 5X96 (MISCELLANEOUS) IMPLANT
GLIDEWIRE ADV .035X260CM (WIRE) IMPLANT
KIT CV MULTILUMEN 7FR 20 (SET/KITS/TRAYS/PACK) IMPLANT
PACK ANGIOGRAPHY (CUSTOM PROCEDURE TRAY) ×1 IMPLANT
SHEATH BRITE TIP 5FRX11 (SHEATH) IMPLANT
SHEATH FLEXOR ANSEL2 7FRX45 (SHEATH) IMPLANT
SYR MEDRAD MARK 7 150ML (SYRINGE) IMPLANT
TUBING CONTRAST HIGH PRESS 72 (TUBING) IMPLANT
WIRE G V18X300CM (WIRE) IMPLANT
WIRE J 3MM .035X145CM (WIRE) IMPLANT

## 2024-11-06 NOTE — Op Note (Signed)
 [facility]  Percutaneous Study/Intervention Procedural Note   Date of Surgery: 11/06/2024  Surgeon(s):Geraldean Walen    Assistants:none  Pre-operative Diagnosis: Ischemic left lower extremity, acute on chronic ischemia with thrombosis of previous stents  Post-operative diagnosis:  Same  Procedure(s) Performed:             1.  Ultrasound guidance for vascular access right femoral artery             2.  Catheter placement into left posterior tibial artery from the right femoral approach             3.  Aortogram and selective left lower extremity angiogram             4.  Mechanical thrombectomy using the penumbra 7 lightening bolt thrombectomy catheter to the left SFA, popliteal artery, tibioperoneal trunk, and posterior tibial artery             5.  Catheter directed thrombolytic therapy with 8 mg of tPA placed through a 135 cm total length 50 cm working length infusion catheter that we will have continuous thrombolytic therapy overnight  6.  Ultrasound guidance for vascular access right femoral vein             7.  Placement of right femoral venous triple-lumen catheter  EBL: 250 cc  Contrast: 55 cc  Fluoro Time: 10.6 minutes  Moderate Conscious Sedation Time: approximately 53 minutes using 3 mg of Versed  and 75 mcg of Fentanyl               Indications:  Patient is a 68 y.o.male with acute on chronic ischemia of the left lower extremity with an abrupt change from asymptomatic to rest pain over the past week. The patient has noninvasive study showing thrombosis of his previous interventions with a marked reduction in the ABI. The patient is brought in for angiography for further evaluation and potential treatment.  Due to the limb threatening nature of the situation, angiogram was performed for attempted limb salvage. The patient is aware that if the procedure fails, amputation would be expected.  The patient also understands that even with successful  revascularization, amputation may still be required due to the severity of the situation. Risks and benefits are discussed and informed consent is obtained.   Procedure:  The patient was identified and appropriate procedural time out was performed.  The patient was then placed supine on the table and prepped and draped in the usual sterile fashion. Moderate conscious sedation was administered during a face to face encounter with the patient throughout the procedure with my supervision of the RN administering medicines and monitoring the patient's vital signs, pulse oximetry, telemetry and mental status throughout from the start of the procedure until the patient was taken to the recovery room. Ultrasound was used to evaluate the right common femoral artery.  It was patent .  A digital ultrasound image was acquired.  A Seldinger needle was used to access the right common femoral artery under direct ultrasound guidance and a permanent image was performed.  A 0.035 J wire was advanced without resistance and a 5Fr sheath was placed.  Pigtail catheter was placed into the aorta and an AP aortogram was performed. This demonstrated normal renal arteries and normal aorta and iliac segments without significant stenosis. I then crossed the aortic bifurcation and advanced to the left femoral head. Selective left lower extremity angiogram was then performed. This demonstrated thrombosis of the entire left SFA and  popliteal after the previously placed stents with an abrupt occlusion just above the previously placed stents.  There was some narrowing in the profunda femoris artery although it did not appear high-grade.  There was reconstitution of all 3 tibial vessels after occlusion proximally. It was felt that it was in the patient's best interest to proceed with intervention after these images to avoid a second procedure and a larger amount of contrast and fluoroscopy based off of the findings from the initial angiogram. The  patient was systemically heparinized and a 7 French Ansell sheath was then placed over the [employer] wire. I then used a Kumpe catheter and the advantage wire to easily navigate through the thrombosis in the left SFA and popliteal arteries and get down into the posterior tibial artery confirm intraluminal flow.  I then placed a V18 wire.  I then brought the penumbra 7 lightening/bolt catheter onto the field to perform mechanical thrombectomy.  4 to 5 passes were made throughout the entire left SFA, popliteal artery, tibioperoneal trunk, and proximal posterior tibial artery performing mechanical thrombectomy over the extensive thrombosis throughout the left lower extremity.  A large amount of thrombus was removed, but given the extensive nature of the thrombosis prior to thrombectomy, there was still a large amount of thrombus residual.  It was fairly clear that this was not going to be resolved without a continuous thrombolytic therapy drip at this point.  I then placed a 135 cm total length 50 cm working length thrombolytic catheter that started at the origin of the SFA and terminated in the tibioperoneal trunk.  I then instilled 8 mg of tPA through this catheter and secured the catheter and this sheath to the skin with a silk suture.  To ensure adequate venous access and avoid venipuncture for lab draws, central line was then placed.  As the right groin was already prepped out, the right femoral vein was visualized with ultrasound and found to be widely patent.  It was then accessed under direct ultrasound guidance without difficulty with a Seldinger needle and a permanent image was recorded.  A J-wire was placed.  After skin nick and dilatation a triple-lumen catheter was placed over the wire and the wire was removed.  All 3 lines withdrew dark red nonpulsatile blood and flushed easily with heparinized saline.  It was then secured to the skin with 2 silk sutures.  The patient was taken to the recovery room  in stable condition having tolerated the procedure well.  Findings:               Aortogram:  This demonstrated normal renal arteries and normal aorta and iliac segments without significant stenosis.             Left Lower Extremity:  This demonstrated thrombosis of the entire left SFA and popliteal after the previously placed stents with an abrupt occlusion just above the previously placed stents.  There was some narrowing in the profunda femoris artery although it did not appear high-grade.  There was reconstitution of all 3 tibial vessels after occlusion proximally.    Disposition: Patient was taken to the recovery room in stable condition having tolerated the procedure well.  Complications: None  Selinda Gu 11/06/2024 10:29 AM   This note was created with Dragon Medical transcription system. Any errors in dictation are purely unintentional.

## 2024-11-06 NOTE — Interval H&P Note (Signed)
 History and Physical Interval Note:  11/06/2024 8:04 AM  Carl Norris  has presented today for surgery, with the diagnosis of LLE Angio   ASO w rest pain.  The various methods of treatment have been discussed with the patient and family. After consideration of risks, benefits and other options for treatment, the patient has consented to  Procedures: LOWER EXTREMITY INTERVENTION (Left) Lower Extremity Angiography (Left) as a surgical intervention.  The patient's history has been reviewed, patient examined, no change in status, stable for surgery.  I have reviewed the patient's chart and labs.  Questions were answered to the patient's satisfaction.     [name]

## 2024-11-06 NOTE — Plan of Care (Signed)
" °  Problem: Education: Goal: Knowledge of General Education information will improve Description: Including pain rating scale, medication(s)/side effects and non-pharmacologic comfort measures Outcome: Progressing   Problem: Clinical Measurements: Goal: Will remain free from infection Outcome: Progressing Goal: Cardiovascular complication will be avoided Outcome: Progressing   Problem: Nutrition: Goal: Adequate nutrition will be maintained Outcome: Progressing   Problem: Coping: Goal: Level of anxiety will decrease Outcome: Progressing   Problem: Safety: Goal: Ability to remain free from injury will improve Outcome: Progressing   Problem: Skin Integrity: Goal: Risk for impaired skin integrity will decrease Outcome: Progressing   "

## 2024-11-07 ENCOUNTER — Encounter: Payer: Self-pay | Admitting: Vascular Surgery

## 2024-11-07 ENCOUNTER — Encounter: Admission: RE | Disposition: A | Payer: Self-pay | Source: Home / Self Care | Attending: Vascular Surgery

## 2024-11-07 ENCOUNTER — Telehealth (HOSPITAL_COMMUNITY): Payer: Self-pay

## 2024-11-07 ENCOUNTER — Other Ambulatory Visit (HOSPITAL_COMMUNITY): Payer: Self-pay

## 2024-11-07 HISTORY — PX: LOWER EXTREMITY ANGIOGRAPHY: CATH118251

## 2024-11-07 LAB — FIBRINOGEN
Fibrinogen: 261 mg/dL (ref 210–475)
Fibrinogen: 242 mg/dL (ref 210–475)

## 2024-11-07 LAB — HEMOGLOBIN A1C
Hgb A1c MFr Bld: 5.7 % — ABNORMAL HIGH (ref 4.8–5.6)
Mean Plasma Glucose: 116.89 mg/dL

## 2024-11-07 LAB — CBC
HCT: 37.3 % — ABNORMAL LOW (ref 39.0–52.0)
HCT: 37.9 % — ABNORMAL LOW (ref 39.0–52.0)
Hemoglobin: 12.7 g/dL — ABNORMAL LOW (ref 13.0–17.0)
Hemoglobin: 12.8 g/dL — ABNORMAL LOW (ref 13.0–17.0)
MCH: 33.2 pg (ref 26.0–34.0)
MCH: 33 pg (ref 26.0–34.0)
MCHC: 34 g/dL (ref 30.0–36.0)
MCHC: 33.8 g/dL (ref 30.0–36.0)
MCV: 97.4 fL (ref 80.0–100.0)
MCV: 97.7 fL (ref 80.0–100.0)
Platelets: 128 K/uL — ABNORMAL LOW (ref 150–400)
Platelets: 119 K/uL — ABNORMAL LOW (ref 150–400)
RBC: 3.83 MIL/uL — ABNORMAL LOW (ref 4.22–5.81)
RBC: 3.88 MIL/uL — ABNORMAL LOW (ref 4.22–5.81)
RDW: 13.1 % (ref 11.5–15.5)
RDW: 13 % (ref 11.5–15.5)
WBC: 7.4 K/uL (ref 4.0–10.5)
WBC: 6.8 K/uL (ref 4.0–10.5)
nRBC: 0 % (ref 0.0–0.2)
nRBC: 0 % (ref 0.0–0.2)

## 2024-11-07 LAB — GLUCOSE, CAPILLARY
Glucose-Capillary: 98 mg/dL (ref 70–99)
Glucose-Capillary: 95 mg/dL (ref 70–99)

## 2024-11-07 LAB — HEPARIN LEVEL (UNFRACTIONATED)
Heparin Unfractionated: <0.1 [IU]/mL — ABNORMAL LOW (ref 0.30–0.70)
Heparin Unfractionated: <0.1 [IU]/mL — ABNORMAL LOW (ref 0.30–0.70)

## 2024-11-07 SURGERY — LOWER EXTREMITY ANGIOGRAPHY
Anesthesia: Moderate Sedation | Laterality: Left

## 2024-11-07 MED ORDER — FENTANYL CITRATE (PF) 50 MCG/ML IJ SOSY
PREFILLED_SYRINGE | INTRAMUSCULAR | Status: AC
  Filled 2024-11-07: qty 2

## 2024-11-07 MED ORDER — FENTANYL CITRATE (PF) 100 MCG/2ML IJ SOLN
INTRAMUSCULAR | Status: DC | PRN
  Administered 2024-11-07: 50 ug via INTRAVENOUS

## 2024-11-07 MED ORDER — LIDOCAINE-EPINEPHRINE (PF) 1 %-1:200000 IJ SOLN
INTRAMUSCULAR | Status: DC | PRN
  Administered 2024-11-07: 10 mL

## 2024-11-07 MED ORDER — APIXABAN 5 MG PO TABS: 5.0000 mg | ORAL_TABLET | Freq: Two times a day (BID) | ORAL | 5 refills | Status: AC

## 2024-11-07 MED ORDER — INSULIN ASPART 100 UNIT/ML IJ SOLN: 0.0000 [IU] | Freq: Three times a day (TID) | INTRAMUSCULAR | Status: DC

## 2024-11-07 MED ORDER — HEPARIN SODIUM (PORCINE) 1000 UNIT/ML IJ SOLN
INTRAMUSCULAR | Status: DC | PRN
  Administered 2024-11-07: 3000 [IU] via INTRAVENOUS

## 2024-11-07 MED ORDER — CEFAZOLIN SODIUM-DEXTROSE 2-4 GM/100ML-% IV SOLN
INTRAVENOUS | Status: AC
  Filled 2024-11-07: qty 100

## 2024-11-07 MED ORDER — TIROFIBAN (AGGRASTAT) BOLUS VIA INFUSION
25.0000 ug/kg | Freq: Once | INTRAVENOUS | Status: AC
  Administered 2024-11-07: 2212.5 ug via INTRAVENOUS
  Filled 2024-11-07: qty 45

## 2024-11-07 MED ORDER — TIROFIBAN HCL IN NACL 5-0.9 MG/100ML-% IV SOLN
0.1500 ug/kg/min | INTRAVENOUS | Status: DC
  Administered 2024-11-07 (×2): 0.15 ug/kg/min via INTRAVENOUS
  Filled 2024-11-07 (×3): qty 100

## 2024-11-07 MED ORDER — APIXABAN 5 MG PO TABS
5.0000 mg | ORAL_TABLET | Freq: Two times a day (BID) | ORAL | Status: DC
  Filled 2024-11-07: qty 1

## 2024-11-07 MED ORDER — MIDAZOLAM HCL (PF) 2 MG/2ML IJ SOLN
INTRAMUSCULAR | Status: DC | PRN
  Administered 2024-11-07: 2 mg via INTRAVENOUS

## 2024-11-07 MED ORDER — IODIXANOL 320 MG/ML IV SOLN
INTRAVENOUS | Status: DC | PRN
  Administered 2024-11-07: 30 mL

## 2024-11-07 MED ORDER — HEPARIN (PORCINE) IN NACL 1000-0.9 UT/500ML-% IV SOLN
INTRAVENOUS | Status: DC | PRN
  Administered 2024-11-07: 1000 mL

## 2024-11-07 MED ORDER — TIROFIBAN HCL IN NACL 5-0.9 MG/100ML-% IV SOLN
INTRAVENOUS | Status: AC
  Filled 2024-11-07: qty 100

## 2024-11-07 MED ORDER — CEFAZOLIN SODIUM-DEXTROSE 2-4 GM/100ML-% IV SOLN
2.0000 g | Freq: Once | INTRAVENOUS | Status: AC
  Administered 2024-11-07: 2 g via INTRAVENOUS

## 2024-11-07 MED ORDER — MIDAZOLAM HCL 2 MG/2ML IJ SOLN
INTRAMUSCULAR | Status: AC
  Filled 2024-11-07: qty 4

## 2024-11-07 MED ORDER — HEPARIN SODIUM (PORCINE) 1000 UNIT/ML IJ SOLN
INTRAMUSCULAR | Status: AC
  Filled 2024-11-07: qty 10

## 2024-11-07 SURGICAL SUPPLY — 10 items
BALLOON LUTONIX DCB 7X60X130 (BALLOONS) IMPLANT
BALLOON ULTRVRSE 8X60X75C (BALLOONS) IMPLANT
CANISTER PENUMBRA ENGINE (MISCELLANEOUS) IMPLANT
CATH LIGHTNING BOLT 7 130 (CATHETERS) IMPLANT
DEVICE PRESTO INFLATION (MISCELLANEOUS) IMPLANT
DEVICE STARCLOSE SE CLOSURE (Vascular Products) IMPLANT
PACK ANGIOGRAPHY (CUSTOM PROCEDURE TRAY) ×1 IMPLANT
STENT VIABAHN 8X50X120 (Permanent Stent) IMPLANT
WIRE G V18X300CM (WIRE) IMPLANT
WIRE J 3MM .035X145CM (WIRE) IMPLANT

## 2024-11-07 NOTE — Telephone Encounter (Signed)
 Pharmacy Patient Advocate Encounter  Insurance verification completed.    The patient is insured through [employer]/ [employer]. Patient has Medicare and is not eligible for a copay card, but may be able to apply for patient assistance or Medicare RX Payment Plan (Patient Must reach out to their plan, if eligible for payment plan), if available.    Ran test claim for Eliquis  5mg  tablet and the current 30 day co-pay is $0.   This test claim was processed through [employer]- copay amounts may vary at other pharmacies due to [employer], or as the patient moves through the different stages of their insurance plan.

## 2024-11-07 NOTE — Progress Notes (Signed)
 Patient stated that he was feeling better after his procedure he had this morning and was requesting to go home. Messaged MD to let him know. Order to stop Aggrastat , order to remove right femoral CVC and discharge orders placed. Went over discharge instructions and medications with patient. Patient stated that he understood. All questions were answered. Patient transported via wheelchair to entrance. Patient going home POV with brother.

## 2024-11-07 NOTE — Op Note (Signed)
 [facility]  Percutaneous Study/Intervention Procedural Note   Date of Surgery: 11/07/2024  Surgeon(s):Lysander Calixte    Assistants:none  Pre-operative Diagnosis: PAD with rest pain left lower extremity, status post overnight thrombolytic therapy  Post-operative diagnosis:  Same  Procedure(s) Performed:             1.  Angiogram through existing catheter left lower extremity             2.  Mechanical thrombectomy to the left SFA with the penumbra CAT 7 lightning device             3.  Stent placement to the proximal SFA with 8 mm diameter by 5 cm length Viabahn stent             4.  StarClose closure device right femoral artery  EBL: 100 cc  Contrast: 30 cc  Fluoro Time: 5.3 minutes  Moderate Conscious Sedation Time: approximately 32 minutes using 2 mg of Versed  and 50 mcg of Fentanyl               Indications:  Patient is a 68 y.o.male with an ischemic left lower extremity status post overnight thrombolytic therapy. The patient is brought in for angiography for further evaluation and potential treatment.  Due to the limb threatening nature of the situation, angiogram was performed for attempted limb salvage. The patient is aware that if the procedure fails, amputation would be expected.  The patient also understands that even with successful revascularization, amputation may still be required due to the severity of the situation.  Risks and benefits are discussed and informed consent is obtained.   Procedure:  The patient was identified and appropriate procedural time out was performed.  The patient was then placed supine on the table and prepped and draped in the usual sterile fashion. Moderate conscious sedation was administered during a face to face encounter with the patient throughout the procedure with my supervision of the RN administering medicines and monitoring the patient's vital signs, pulse oximetry, telemetry and mental status throughout from the start  of the procedure until the patient was taken to the recovery room.  The existing thrombolytic catheter was removed over a V18 wire which was parked in the distal peroneal artery.  The 7 French sheath was already in place.  Selective left lower extremity angiogram was then performed. This demonstrated continued thrombus and stenosis just above the previously placed stent now creating about a 60 to 70% stenosis.  This was improved from yesterday but still creating flow limitation.  The remainder of the SFA and popliteal stents were now patent without significant residual thrombus after overnight thrombolytic therapy and no significant stenosis and there was three-vessel runoff distally without significant tibial disease. It was felt that it was in the patient's best interest to proceed with intervention after these images.  I then brought the penumbra 7 lightening device onto the field again and perform mechanical thrombectomy in the proximal left SFA.  3 passes were made.  A small amount of chronic appearing thrombus was removed but there still remained a flap creating flow limitation even without significant residual thrombus after thrombectomy.  This was just above the previously placed stent and about a centimeter to a centimeter and a half into the SFA and an area which was likely within the patch from the previous endarterectomy.  This measured about 7-1/2 mm in diameter and so an 8 mm diameter by 5 cm length Viabahn stent was started at  the origin of the SFA and taken down across this lesion in the proximal SFA and into the previously placed stents.  This was postdilated with an 8 mm diameter balloon proximally and a 7 mm diameter balloon within the stent and to bridge the distal edge of the new stent to the previously placed stents.  Completion imaging showed marked improvement with resolution of the narrowing and thrombus and no significant residual stenosis after stent placement.  There also remained  preserved flow distally.  I elected to terminate the procedure. The sheath was removed and StarClose closure device was deployed in the right femoral artery with excellent hemostatic result. The patient was taken to the recovery room in stable condition having tolerated the procedure well.  Findings:                           Left Lower Extremity:  This demonstrated continued thrombus and stenosis just above the previously placed stent now creating about a 60 to 70% stenosis.  This was improved from yesterday but still creating flow limitation.  The remainder of the SFA and popliteal stents were now patent without significant residual thrombus after overnight thrombolytic therapy and no significant stenosis and there was three-vessel runoff distally without significant tibial disease.   Disposition: Patient was taken to the recovery room in stable condition having tolerated the procedure well.  Complications: None  Selinda Gu 11/07/2024 9:00 AM   This note was created with Dragon Medical transcription system. Any errors in dictation are purely unintentional.

## 2024-11-07 NOTE — Discharge Summary (Addendum)
 " [facility]    Discharge Summary    Patient ID:  Carl Norris MRN: [id number] DOB/AGE: 12-06-1955 68 y.o.  Admit date: 11/06/2024 Discharge date: 11/07/2024 Date of Surgery: 11/07/2024 Surgeon: Surgeon(s): Marea Selinda RAMAN, MD  Admission Diagnosis: Atherosclerosis of artery of extremity with rest pain [facility]) [I70.229] Ischemic leg [I99.8]  Discharge Diagnoses:  Atherosclerosis of artery of extremity with rest pain (HCC) [I70.229] Ischemic leg [I99.8]  Secondary Diagnoses: Past Medical History:  Diagnosis Date   Diabetes mellitus without complication (HCC)    Erectile dysfunction    Hypertension    Peripheral vascular disease    Sleep apnea     Procedures: Lower Extremity Angiography  Discharged Condition: good  HPI:  Patient came in with a profoundly ischemic left lower extremity.  Had rest pain.  Multiple previous surgeries and interventions.  Hospital Course:  Carl Norris is a 68 y.o. male is S/P Left Procedures: Lower Extremity Angiography, thrombolytic therapy, extensive percutaneous revascularization Extubated: POD # na Physical exam: Foot is warm with palpable pulses.  Access site is without hematoma Post-op wounds healing well Pt. Ambulating, voiding and taking PO diet without difficulty. Pt pain controlled with PO pain meds. Labs as below Complications:none  Consults:    Significant Diagnostic Studies: CBC Lab Results  Component Value Date   WBC 6.8 11/07/2024   HGB 12.8 (L) 11/07/2024   HCT 37.9 (L) 11/07/2024   MCV 97.7 11/07/2024   PLT 119 (L) 11/07/2024    BMET    Component Value Date/Time   NA 135 07/16/2021 0454   NA 141 03/16/2014 1656   K 3.5 07/16/2021 1336   K 4.0 03/16/2014 1656   CL 99 07/16/2021 0454   CL 106 03/16/2014 1656   CO2 30 07/16/2021 0454   CO2 29 03/16/2014 1656   GLUCOSE 124 (H) 07/16/2021 0454   GLUCOSE 79 03/16/2014 1656   BUN 12 11/06/2024 0821   BUN 16 03/16/2014 1656    CREATININE 1.12 11/06/2024 0821   CREATININE 0.97 03/16/2014 1656   CALCIUM  7.6 (L) 07/16/2021 0454   CALCIUM  8.9 03/16/2014 1656   GFRNONAA >60 11/06/2024 0821   GFRNONAA >60 03/16/2014 1656   GFRAA >60 03/16/2014 1656   COAG Lab Results  Component Value Date   INR 1.4 (H) 07/14/2021     Disposition:  Discharge to :Home Discharge Instructions     Call MD for:  redness, tenderness, or signs of infection (pain, swelling, bleeding, redness, odor or green/yellow discharge around incision site)   Complete by: As directed    Call MD for:  severe or increased pain, loss or decreased feeling  in affected limb(s)   Complete by: As directed    Call MD for:  temperature >100.5   Complete by: As directed    Driving Restrictions   Complete by: As directed    No driving for 24 hours   Lifting restrictions   Complete by: As directed    No lifting for 24 hours   No dressing needed   Complete by: As directed    Replace only if drainage present   Resume previous diet   Complete by: As directed       Allergies as of 11/07/2024       Reactions   Oxycodone -acetaminophen  Itching        Medication List     STOP taking these medications    clopidogrel  75 MG tablet Commonly known as: PLAVIX   TAKE these medications    acetaminophen  325 MG tablet Commonly known as: TYLENOL  Take 650 mg by mouth every 6 (six) hours as needed.   apixaban  5 MG Tabs tablet Commonly known as: Eliquis  Take 1 tablet (5 mg total) by mouth 2 (two) times daily.   ASPIRIN  81 PO Take by mouth daily.   atorvastatin  10 MG tablet Commonly known as: LIPITOR TAKE 1 TABLET(10 MG) BY MOUTH DAILY   gabapentin  300 MG capsule Commonly known as: NEURONTIN  TAKE 1 CAPSULE BY MOUTH EVERY MORNING AND 2 CAPSULES EVERY EVENING   GLP-1 Companion Kit by Does not apply route.   hydrochlorothiazide  25 MG tablet Commonly known as: HYDRODIURIL  Take 25 mg by mouth daily.   HYDROcodone -acetaminophen  5-325  MG tablet Commonly known as: NORCO/VICODIN Take 1 tablet by mouth every 6 (six) hours as needed for moderate pain (pain score 4-6).   losartan  25 MG tablet Commonly known as: COZAAR  Take 25 mg by mouth daily.   metoprolol  succinate 100 MG 24 hr tablet Commonly known as: TOPROL -XL Take 100 mg by mouth daily.   MOUNJARO [place] Inject into the skin.   pantoprazole  40 MG tablet Commonly known as: PROTONIX  Take by mouth.   sitaGLIPtin 100 MG tablet Commonly known as: JANUVIA Take 100 mg by mouth daily.       Verbal and written Discharge instructions given to the patient. Wound care per Discharge AVS  Follow-up Information     Vonetta Foulk, Selinda RAMAN, MD Follow up in 4 week(s).   Specialties: Vascular Surgery, Radiology, Interventional Cardiology Why: with ABIs Contact information: [address] [place] [address] [phone]                 Signed: Selinda Gu, MD  11/07/2024, 3:05 PM   "

## 2024-11-07 NOTE — Interval H&P Note (Signed)
 History and Physical Interval Note:  11/07/2024 8:02 AM  Carl Norris  has presented today for surgery, with the diagnosis of PAD.  The various methods of treatment have been discussed with the patient and family. After consideration of risks, benefits and other options for treatment, the patient has consented to  Procedures: Lower Extremity Angiography (Left) as a surgical intervention.  The patient's history has been reviewed, patient examined, no change in status, stable for surgery.  I have reviewed the patient's chart and labs.  Questions were answered to the patient's satisfaction.     Gerren Hoffmeier

## 2024-11-07 NOTE — Plan of Care (Signed)

## 2024-12-07 ENCOUNTER — Other Ambulatory Visit (INDEPENDENT_AMBULATORY_CARE_PROVIDER_SITE_OTHER): Payer: Self-pay | Admitting: Vascular Surgery

## 2024-12-07 DIAGNOSIS — I70222 Atherosclerosis of native arteries of extremities with rest pain, left leg: Secondary | ICD-10-CM

## 2024-12-07 DIAGNOSIS — I10 Essential (primary) hypertension: Secondary | ICD-10-CM

## 2024-12-07 DIAGNOSIS — I779 Disorder of arteries and arterioles, unspecified: Secondary | ICD-10-CM

## 2024-12-07 DIAGNOSIS — I739 Peripheral vascular disease, unspecified: Secondary | ICD-10-CM

## 2024-12-07 DIAGNOSIS — E1165 Type 2 diabetes mellitus with hyperglycemia: Secondary | ICD-10-CM

## 2024-12-08 ENCOUNTER — Ambulatory Visit (INDEPENDENT_AMBULATORY_CARE_PROVIDER_SITE_OTHER): Admitting: Nurse Practitioner

## 2024-12-08 ENCOUNTER — Encounter (INDEPENDENT_AMBULATORY_CARE_PROVIDER_SITE_OTHER): Payer: Self-pay | Admitting: Nurse Practitioner

## 2024-12-08 ENCOUNTER — Ambulatory Visit (INDEPENDENT_AMBULATORY_CARE_PROVIDER_SITE_OTHER)

## 2024-12-08 VITALS — BP 115/76 | HR 75 | Resp 17 | Ht 70.0 in | Wt 185.6 lb

## 2024-12-08 DIAGNOSIS — I739 Peripheral vascular disease, unspecified: Secondary | ICD-10-CM

## 2024-12-08 DIAGNOSIS — E1165 Type 2 diabetes mellitus with hyperglycemia: Secondary | ICD-10-CM

## 2024-12-08 DIAGNOSIS — Z720 Tobacco use: Secondary | ICD-10-CM

## 2024-12-08 DIAGNOSIS — I10 Essential (primary) hypertension: Secondary | ICD-10-CM

## 2024-12-08 DIAGNOSIS — Z9889 Other specified postprocedural states: Secondary | ICD-10-CM

## 2024-12-08 DIAGNOSIS — I779 Disorder of arteries and arterioles, unspecified: Secondary | ICD-10-CM | POA: Diagnosis not present

## 2024-12-11 NOTE — Progress Notes (Signed)
 "  Subjective:    Patient ID: Carl Norris, male    DOB: April 20, 1956, 69 y.o.   MRN: [id number] Chief Complaint  Patient presents with   Follow-up     4 weeks (12/07/2024); with ABIs     HPI  Discussed the use of AI scribe software for clinical note transcription with the patient, who gave verbal consent to proceed.  History of Present Illness Carl Norris is a 69 year old male with peripheral arterial disease and left internal carotid artery occlusion who presents for follow-up after left lower extremity revascularization.  He underwent left lower extremity revascularization on November 07, 2024 for severe atherosclerotic disease, previously characterized by rest pain and markedly reduced perfusion (pre-procedure left ABI 0.26, absent digital flow). Since the procedure, he reports significant improvement in the left leg, including increased sensation, resolution of rest pain, and absence of gait instability. He is ambulating independently, performing daily activities, and denies new symptoms or complications.  He has been tapering gabapentin , taking only one dose on the day of the visit, and intends to discontinue due to improved foot symptoms and sensation. He attributes prior episodes of dizziness and lightheadedness to gabapentin , describing transient disequilibrium requiring support, but denies ongoing dizziness or other neurological symptoms. He continues aspirin , Eliquis , and a statin as prescribed.  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 The right internal carotid artery demonstrates less than 50% stenosis, and the posterior circulation is patent. He denies symptoms of cerebrovascular insufficiency or recent stroke.    Results Diagnostic Carotid duplex ultrasound (07/28/2024): Left internal carotid artery occlusion; right internal carotid artery stenosis less than 50%; patent posterior circulation  with antegrade flow   Review of Systems  Cardiovascular:  Negative for leg swelling.  All other systems reviewed and are negative.      Objective:   Physical Exam Vitals reviewed.  HENT:     Head: Normocephalic.  Cardiovascular:     Rate and Rhythm: Normal rate.     Pulses:          Dorsalis pedis pulses are detected w/ Doppler on the right side and detected w/ Doppler on the left side.       Posterior tibial pulses are detected w/ Doppler on the right side and detected w/ Doppler on the left side.  Pulmonary:     Effort: Pulmonary effort is normal.  Skin:    General: Skin is warm and dry.  Neurological:     Mental Status: He is alert and oriented to person, place, and time.  Psychiatric:        Mood and Affect: Mood normal.        Behavior: Behavior normal.        Thought Content: Thought content normal.        Judgment: Judgment normal.     Physical Exam    BP 115/76   Pulse 75   Resp 17   Ht 5' 10 (1.778 m)   Wt 185 lb 9.6 oz (84.2 kg)   BMI 26.63 kg/m   Past Medical History:  Diagnosis Date   Diabetes mellitus without complication (HCC)    Erectile dysfunction    Hypertension    Peripheral vascular disease    Sleep apnea     Social History   Socioeconomic History   Marital status: Single    Spouse name: Not on file   Number of children: 0   Years  of education: Not on file   Highest education level: Not on file  Occupational History   Not on file  Tobacco Use   Smoking status: Every Day    Current packs/day: 2.00    Average packs/day: 2.0 packs/day for 47.0 years (94.0 ttl pk-yrs)    Types: Cigarettes   Smokeless tobacco: Never   Tobacco comments:    SMOKED TODAY   Vaping Use   Vaping status: Never Used  Substance and Sexual Activity   Alcohol use: Yes    Comment: socially   Drug use: Never   Sexual activity: Not on file  Other Topics Concern   Not on file  Social History Narrative   Lives by himself    Social Drivers of Health    Tobacco Use: High Risk (12/12/2024)   Patient History    Smoking Tobacco Use: Every Day    Smokeless Tobacco Use: Never    Passive Exposure: Not on file  Financial Resource Strain: Low Risk  (10/27/2024)   Received from [facility]   Overall Financial Resource Strain (CARDIA)    Difficulty of Paying Living Expenses: Not hard at all  Food Insecurity: No Food Insecurity (11/06/2024)   Epic    Worried About Running Out of Food in the Last Year: Never true    Ran Out of Food in the Last Year: Never true  Transportation Needs: No Transportation Needs (11/06/2024)   Epic    Lack of Transportation (Medical): No    Lack of Transportation (Non-Medical): No  Physical Activity: Not on file  Stress: Not on file  Social Connections: Moderately Isolated (11/06/2024)   Social Connection and Isolation Panel    Frequency of Communication with Friends and Family: More than three times a week    Frequency of Social Gatherings with Friends and Family: More than three times a week    Attends Religious Services: 1 to 4 times per year    Active Member of [employer] or Organizations: No    Attends Banker Meetings: Never    Marital Status: Widowed  Intimate Partner Violence: Not At Risk (11/06/2024)   Epic    Fear of Current or Ex-Partner: No    Emotionally Abused: No    Physically Abused: No    Sexually Abused: No  Depression (PHQ2-9): Not on file  Alcohol Screen: Not on file  Housing: Low Risk (11/06/2024)   Epic    Unable to Pay for Housing in the Last Year: No    Number of Times Moved in the Last Year: 0    Homeless in the Last Year: No  Utilities: Not At Risk (11/06/2024)   Epic    Threatened with loss of utilities: No  Health Literacy: Not on file    Past Surgical History:  Procedure Laterality Date   CATARACT EXTRACTION W/PHACO Left 06/24/2022   Procedure: CATARACT EXTRACTION PHACO AND INTRAOCULAR LENS PLACEMENT (IOC) LEFT DIABETIC;  Surgeon: Mittie Gaskin, MD;  Location: [facility];  Service: Ophthalmology;  Laterality: Left;  7.32 1:15.4   COLONOSCOPY N/A 03/14/2024   Procedure: COLONOSCOPY;  Surgeon: Toledo, Ladell POUR, MD;  Location: [facility];  Service: Gastroenterology;  Laterality: N/A;  TRULICITY   ESOPHAGOGASTRODUODENOSCOPY N/A 03/14/2024   Procedure: EGD (ESOPHAGOGASTRODUODENOSCOPY);  Surgeon: Toledo, Ladell POUR, MD;  Location: [facility];  Service: Gastroenterology;  Laterality: N/A;   EYE SURGERY     LOWER EXTREMITY ANGIOGRAPHY Left 08/27/2020   Procedure: LOWER EXTREMITY ANGIOGRAPHY;  Surgeon: Jama Hacker  G, MD;  Location: [facility];  Service: Cardiovascular;  Laterality: Left;   LOWER EXTREMITY ANGIOGRAPHY Left 09/03/2020   Procedure: LOWER EXTREMITY ANGIOGRAPHY;  Surgeon: Jama Cordella MATSU, MD;  Location: [facility];  Service: Cardiovascular;  Laterality: Left;   LOWER EXTREMITY ANGIOGRAPHY Left 07/14/2021   Procedure: LOWER EXTREMITY ANGIOGRAPHY;  Surgeon: Marea Selinda RAMAN, MD;  Location: [facility];  Service: Cardiovascular;  Laterality: Left;   LOWER EXTREMITY ANGIOGRAPHY Left 07/15/2021   Procedure: Lower Extremity Angiography;  Surgeon: Jama Cordella MATSU, MD;  Location: [facility];  Service: Cardiovascular;  Laterality: Left;   LOWER EXTREMITY ANGIOGRAPHY Right 05/27/2023   Procedure: Lower Extremity Angiography;  Surgeon: Marea Selinda RAMAN, MD;  Location: [facility];  Service: Cardiovascular;  Laterality: Right;   LOWER EXTREMITY ANGIOGRAPHY Left 11/06/2024   Procedure: Lower Extremity Angiography;  Surgeon: Marea Selinda RAMAN, MD;  Location: [facility];  Service: Cardiovascular;  Laterality: Left;   LOWER EXTREMITY ANGIOGRAPHY Left 11/07/2024   Procedure: Lower Extremity Angiography;  Surgeon: Marea Selinda RAMAN, MD;  Location: [facility];  Service: Cardiovascular;  Laterality: Left;   LOWER EXTREMITY INTERVENTION Left 11/06/2024   Procedure:  LOWER EXTREMITY INTERVENTION;  Surgeon: Marea Selinda RAMAN, MD;  Location: [facility];  Service: Cardiovascular;  Laterality: Left;   MALONEY DILATION  03/14/2024   Procedure: DILATION, ESOPHAGUS, USING MALONEY DILATOR;  Surgeon: Aundria, Ladell POUR, MD;  Location: [facility];  Service: Gastroenterology;;   NECK SURGERY     POLYPECTOMY  03/14/2024   Procedure: POLYPECTOMY, INTESTINE;  Surgeon: Aundria Ladell POUR, MD;  Location: [facility];  Service: Gastroenterology;;   THROMBECTOMY FEMORAL ARTERY Left 09/04/2020   Procedure: THROMBECTOMY FEMORAL ARTERY possible stent;  Surgeon: Jama Cordella MATSU, MD;  Location: [facility];  Service: Vascular;  Laterality: Left;    Family History  Problem Relation Age of Onset   Cancer Father    Vascular Disease Sister     Allergies[1]     Latest Ref Rng & Units 11/07/2024    4:30 AM 11/06/2024   10:34 PM 11/06/2024    4:41 PM  CBC  WBC 4.0 - 10.5 K/uL 6.8  7.4  6.0   Hemoglobin 13.0 - 17.0 g/dL 87.1  87.2  86.6   Hematocrit 39.0 - 52.0 % 37.9  37.3  39.7   Platelets 150 - 400 K/uL 119  128  138       CMP     Component Value Date/Time   NA 135 07/16/2021 0454   NA 141 03/16/2014 1656   K 3.5 07/16/2021 1336   K 4.0 03/16/2014 1656   CL 99 07/16/2021 0454   CL 106 03/16/2014 1656   CO2 30 07/16/2021 0454   CO2 29 03/16/2014 1656   GLUCOSE 124 (H) 07/16/2021 0454   GLUCOSE 79 03/16/2014 1656   BUN 12 11/06/2024 0821   BUN 16 03/16/2014 1656   CREATININE 1.12 11/06/2024 0821   CREATININE 0.97 03/16/2014 1656   CALCIUM  7.6 (L) 07/16/2021 0454   CALCIUM  8.9 03/16/2014 1656   PROT 5.8 (L) 09/09/2020 0541   PROT 7.6 03/16/2014 1656   ALBUMIN 2.4 (L) 09/09/2020 0541   ALBUMIN 3.7 03/16/2014 1656   AST 19 09/09/2020 0541   AST 25 03/16/2014 1656   ALT 16 09/09/2020 0541   ALT 43 03/16/2014 1656   ALKPHOS 32 (L) 09/09/2020 0541   ALKPHOS 68 03/16/2014 1656   BILITOT 0.6 09/09/2020  0541   BILITOT 0.3 03/16/2014 1656    GFRNONAA >60 11/06/2024 0821   GFRNONAA >60 03/16/2014 1656     VAS US  ABI WITH/WO TBI Result Date: 11/06/2024  LOWER EXTREMITY DOPPLER STUDY Patient Name:  Audon Heymann  Date of Exam:   11/02/2024 Medical Rec #: [id number]       Accession #:    [id number] Date of Birth: 1956/01/18      Patient Gender: M Patient Age:   14 years Exam Location:  [facility] Procedure:      VAS US  ABI WITH/WO TBI Referring Phys: ORVIN DARING --------------------------------------------------------------------------------  Indications: Rest pain, and peripheral artery disease. High Risk Factors: Hypertension, Diabetes, current smoker. Other         24 hours prior, sudden left leg tingling following physical Factors:      activity.  Vascular Interventions: 08/27/2020: Crosser Atherectomy of the Left SFA and                         Popliteal Artery. PTA and Stent placement of the Left                         SFA and Popliteal Artery. PTA of the Profunda Femoral                         Artery.                         09/03/2020 Left SFA thrombectomy with SFA and pop                         stents.                          09/04/2020: Endart Lt CFA, PFA and SFA. Lt SFA and                         Popliteal Artery Thrombectomy and Stents.                          07/14/2021: Mechanical Thrombectomy to the Left SFA and                         Popliteal Artery.                          07/15/2021: PTA and Stent Left SFA and Popliteal Artery.                         04/2023 Mechanical thrombectomy with the Rota Rex device                         to the right SFA and popliteal arteries                         5. Percutaneous transluminal angioplasty of the right                         SFA and most proximal popliteal artery with 5 mm  diameter by 22 cm length Lutonix drug-coated angioplasty                         balloon                         6. Stent placement to the distal SFA with 7 mm diameter                          by 12 cm length life stent. Performing Technologist: Donnice Charnley RVT  Examination Guidelines: A complete evaluation includes at minimum, Doppler waveform signals and systolic blood pressure reading at the level of bilateral brachial, anterior tibial, and posterior tibial arteries, when vessel segments are accessible. Bilateral testing is considered an integral part of a complete examination. Photoelectric Plethysmograph (PPG) waveforms and toe systolic pressure readings are included as required and additional duplex testing as needed. Limited examinations for reoccurring indications may be performed as noted.  ABI Findings: +---------+------------------+-----+----------+--------+ Right    Rt Pressure (mmHg)IndexWaveform  Comment  +---------+------------------+-----+----------+--------+ Brachial 155                                       +---------+------------------+-----+----------+--------+ PTA      129               0.83 triphasic          +---------+------------------+-----+----------+--------+ DP       130               0.84 monophasic         +---------+------------------+-----+----------+--------+ Burnetta Pillow               0.79                    +---------+------------------+-----+----------+--------+ +---------+------------------+-----+-------------------+-------+ Left     Lt Pressure (mmHg)IndexWaveform           Comment +---------+------------------+-----+-------------------+-------+ Brachial 150                                               +---------+------------------+-----+-------------------+-------+ PTA      41                0.26 dampened monophasic        +---------+------------------+-----+-------------------+-------+ PERO     0                 0.00 absent                     +---------+------------------+-----+-------------------+-------+ DP       0                 0.00 absent                      +---------+------------------+-----+-------------------+-------+ Great Toe0                 0.00 Absent                     +---------+------------------+-----+-------------------+-------+ +-------+-----------+-----------+------------+------------+ ABI/TBIToday's ABIToday's TBIPrevious ABIPrevious TBI +-------+-----------+-----------+------------+------------+ Right  0.84       0.79       1.10  1.06         +-------+-----------+-----------+------------+------------+ Left   0.26       0.00       1.05        1.11         +-------+-----------+-----------+------------+------------+  Bilateral ABIs appear decreased compared to prior study on 09/27/2024.  Summary: Right: Resting right ankle-brachial index indicates mild right lower extremity arterial disease. The right toe-brachial index is normal.  Left: Resting left ankle-brachial index indicates critical left limb ischemia. The left toe-brachial index is abnormal.  *See table(s) above for measurements and observations.  Electronically signed by Cordella Shawl MD on 11/06/2024 at 7:55:58 AM.    Final        Assessment & Plan:   1. Peripheral arterial disease with history of revascularization (Primary) Peripheral arterial disease status post left lower extremity revascularization Status post revascularization with improved perfusion and symptoms. Normal ankle-brachial index and toe pressures. Ambulates without difficulty. Gabapentin  self-tapered due to dizziness and symptom improvement. Antiplatelet therapy essential. - Reinforced continuation of aspirin  and apixaban . - Approved discontinuation of gabapentin . - Scheduled follow-up in 3 months with repeat vascular study. - VAS US  LOWER EXTREMITY ARTERIAL DUPLEX; Future - VAS US  ABI WITH/WO TBI; Future  2. Essential (primary) hypertension Continue antihypertensive medications as already ordered, these medications have been reviewed and there are no changes at this time.  3.  Type 2 diabetes mellitus with hyperglycemia, without long-term current use of insulin  (HCC) Continue hypoglycemic medications as already ordered, these medications have been reviewed and there are no changes at this time.  Hgb A1C to be monitored as already arranged by primary service  4. Tobacco user Smoking cessation was discussed, 3-10 minutes spent on this topic specifically   5. Bilateral carotid artery disease, unspecified type Occlusion of left internal carotid artery Chronic total occlusion without cerebrovascular symptoms. Revascularization contraindicated due to embolic stroke risk. Medical management indicated. - Reinforced importance of continued statin and antiplatelet therapy. - No intervention planned for left internal carotid artery. - Continue surveillance of right internal carotid artery; intervene if stenosis >75% or symptoms develop.    Medications Ordered Prior to Encounter[2]  There are no Patient Instructions on file for this visit. Return in about 3 months (around 03/08/2025) for ABI and LLE ARt duplex.   Barnet Benavides E Cardarius Senat, NP      [1]  Allergies Allergen Reactions   Oxycodone -Acetaminophen  Itching  [2]  Current Outpatient Medications on File Prior to Visit  Medication Sig Dispense Refill   acetaminophen  (TYLENOL ) 325 MG tablet Take 650 mg by mouth every 6 (six) hours as needed.     apixaban  (ELIQUIS ) 5 MG TABS tablet Take 1 tablet (5 mg total) by mouth 2 (two) times daily. 60 tablet 5   ASPIRIN  81 PO Take by mouth daily.     atorvastatin  (LIPITOR) 10 MG tablet TAKE 1 TABLET(10 MG) BY MOUTH DAILY 90 tablet 1   DTx App - Wellness (GLP-1 COMPANION) KIT by Does not apply route.     hydrochlorothiazide  (HYDRODIURIL ) 25 MG tablet Take 25 mg by mouth daily.      HYDROcodone -acetaminophen  (NORCO/VICODIN) 5-325 MG tablet Take 1 tablet by mouth every 6 (six) hours as needed for moderate pain (pain score 4-6). 30 tablet 0   losartan  (COZAAR ) 25 MG tablet Take 25 mg  by mouth daily.     metoprolol  succinate (TOPROL -XL) 100 MG 24 hr tablet Take 100 mg by mouth daily.      pantoprazole  (PROTONIX ) 40  MG tablet Take by mouth.     sitaGLIPtin (JANUVIA) 100 MG tablet Take 100 mg by mouth daily.     Tirzepatide (MOUNJARO Burnet) Inject into the skin.     gabapentin  (NEURONTIN ) 300 MG capsule TAKE 1 CAPSULE BY MOUTH EVERY MORNING AND 2 CAPSULES EVERY EVENING (Patient not taking: Reported on 12/08/2024) 270 capsule 3   No current facility-administered medications on file prior to visit.   "

## 2024-12-12 ENCOUNTER — Encounter (INDEPENDENT_AMBULATORY_CARE_PROVIDER_SITE_OTHER): Payer: Self-pay | Admitting: Nurse Practitioner

## 2024-12-12 LAB — VAS US ABI WITH/WO TBI
Left ABI: 1.09
Right ABI: 1

## 2025-03-09 ENCOUNTER — Encounter (INDEPENDENT_AMBULATORY_CARE_PROVIDER_SITE_OTHER)

## 2025-03-09 ENCOUNTER — Ambulatory Visit (INDEPENDENT_AMBULATORY_CARE_PROVIDER_SITE_OTHER): Admitting: Nurse Practitioner

## 2025-03-27 ENCOUNTER — Encounter (INDEPENDENT_AMBULATORY_CARE_PROVIDER_SITE_OTHER)

## 2025-03-27 ENCOUNTER — Ambulatory Visit (INDEPENDENT_AMBULATORY_CARE_PROVIDER_SITE_OTHER): Admitting: Vascular Surgery
